# Patient Record
Sex: Male | Born: 1940 | Race: White | Hispanic: No | Smoking: Former smoker
Health system: Southern US, Community
[De-identification: ages and names within clinical notes are randomized; demographics above are authoritative.]

## PROBLEM LIST (undated history)

## (undated) DIAGNOSIS — F1910 Other psychoactive substance abuse, uncomplicated: Secondary | ICD-10-CM

## (undated) DIAGNOSIS — R310 Gross hematuria: Secondary | ICD-10-CM

## (undated) DIAGNOSIS — K5790 Diverticulosis of intestine, part unspecified, without perforation or abscess without bleeding: Secondary | ICD-10-CM

## (undated) DIAGNOSIS — C689 Malignant neoplasm of urinary organ, unspecified: Secondary | ICD-10-CM

## (undated) DIAGNOSIS — Z860100 Personal history of colon polyps, unspecified: Secondary | ICD-10-CM

## (undated) DIAGNOSIS — E785 Hyperlipidemia, unspecified: Secondary | ICD-10-CM

## (undated) DIAGNOSIS — K860 Alcohol-induced chronic pancreatitis: Secondary | ICD-10-CM

## (undated) DIAGNOSIS — I739 Peripheral vascular disease, unspecified: Secondary | ICD-10-CM

## (undated) DIAGNOSIS — I714 Abdominal aortic aneurysm, without rupture, unspecified: Secondary | ICD-10-CM

## (undated) DIAGNOSIS — I10 Essential (primary) hypertension: Secondary | ICD-10-CM

## (undated) DIAGNOSIS — M1990 Unspecified osteoarthritis, unspecified site: Secondary | ICD-10-CM

## (undated) HISTORY — DX: Personal history of colon polyps, unspecified: Z86.0100

## (undated) HISTORY — DX: Abdominal aortic aneurysm, without rupture: I71.4

## (undated) HISTORY — DX: Diverticulosis of intestine, part unspecified, without perforation or abscess without bleeding: K57.90

## (undated) HISTORY — DX: Malignant neoplasm of urinary organ, unspecified: C68.9

## (undated) HISTORY — DX: Personal history of colonic polyps: Z86.010

## (undated) HISTORY — DX: Abdominal aortic aneurysm, without rupture, unspecified: I71.40

## (undated) HISTORY — DX: Peripheral vascular disease, unspecified: I73.9

## (undated) HISTORY — PX: CHOLECYSTECTOMY: SHX55

## (undated) HISTORY — DX: Other psychoactive substance abuse, uncomplicated: F19.10

## (undated) HISTORY — DX: Essential (primary) hypertension: I10

## (undated) HISTORY — DX: Hyperlipidemia, unspecified: E78.5

---

## 1959-05-03 HISTORY — PX: KNEE SURGERY: SHX244

## 1996-05-02 HISTORY — PX: CATARACT EXTRACTION W/ INTRAOCULAR LENS  IMPLANT, BILATERAL: SHX1307

## 2004-01-23 ENCOUNTER — Ambulatory Visit (HOSPITAL_COMMUNITY): Admission: RE | Admit: 2004-01-23 | Discharge: 2004-01-23 | Payer: Self-pay | Admitting: Cardiology

## 2004-03-18 ENCOUNTER — Ambulatory Visit: Payer: Self-pay | Admitting: Internal Medicine

## 2004-07-28 ENCOUNTER — Ambulatory Visit: Payer: Self-pay | Admitting: Internal Medicine

## 2004-08-04 ENCOUNTER — Ambulatory Visit: Payer: Self-pay | Admitting: Internal Medicine

## 2004-08-11 ENCOUNTER — Inpatient Hospital Stay (HOSPITAL_COMMUNITY): Admission: EM | Admit: 2004-08-11 | Discharge: 2004-08-15 | Payer: Self-pay | Admitting: Emergency Medicine

## 2004-08-15 ENCOUNTER — Ambulatory Visit: Payer: Self-pay | Admitting: Internal Medicine

## 2004-09-29 ENCOUNTER — Ambulatory Visit: Payer: Self-pay | Admitting: Internal Medicine

## 2004-12-29 ENCOUNTER — Ambulatory Visit: Payer: Self-pay | Admitting: Internal Medicine

## 2005-02-05 ENCOUNTER — Ambulatory Visit: Payer: Self-pay | Admitting: Internal Medicine

## 2005-02-05 ENCOUNTER — Inpatient Hospital Stay (HOSPITAL_COMMUNITY): Admission: EM | Admit: 2005-02-05 | Discharge: 2005-02-10 | Payer: Self-pay | Admitting: Emergency Medicine

## 2005-02-08 ENCOUNTER — Ambulatory Visit: Payer: Self-pay | Admitting: Gastroenterology

## 2005-02-16 ENCOUNTER — Ambulatory Visit: Payer: Self-pay | Admitting: Internal Medicine

## 2005-02-24 ENCOUNTER — Ambulatory Visit: Payer: Self-pay | Admitting: Gastroenterology

## 2005-03-17 ENCOUNTER — Encounter: Admission: RE | Admit: 2005-03-17 | Discharge: 2005-03-17 | Payer: Self-pay | Admitting: Gastroenterology

## 2005-08-17 ENCOUNTER — Ambulatory Visit: Payer: Self-pay | Admitting: Internal Medicine

## 2005-08-22 ENCOUNTER — Ambulatory Visit: Payer: Self-pay | Admitting: Internal Medicine

## 2005-08-25 ENCOUNTER — Ambulatory Visit: Payer: Self-pay | Admitting: Internal Medicine

## 2005-11-11 ENCOUNTER — Emergency Department (HOSPITAL_COMMUNITY): Admission: EM | Admit: 2005-11-11 | Discharge: 2005-11-11 | Payer: Self-pay | Admitting: Emergency Medicine

## 2005-11-12 ENCOUNTER — Inpatient Hospital Stay (HOSPITAL_COMMUNITY): Admission: EM | Admit: 2005-11-12 | Discharge: 2005-11-13 | Payer: Self-pay | Admitting: Emergency Medicine

## 2005-11-15 ENCOUNTER — Inpatient Hospital Stay (HOSPITAL_COMMUNITY): Admission: EM | Admit: 2005-11-15 | Discharge: 2005-11-19 | Payer: Self-pay | Admitting: Emergency Medicine

## 2005-11-15 ENCOUNTER — Ambulatory Visit: Payer: Self-pay | Admitting: Internal Medicine

## 2005-11-17 ENCOUNTER — Ambulatory Visit: Payer: Self-pay | Admitting: Internal Medicine

## 2005-11-17 ENCOUNTER — Encounter (INDEPENDENT_AMBULATORY_CARE_PROVIDER_SITE_OTHER): Payer: Self-pay | Admitting: *Deleted

## 2005-11-29 ENCOUNTER — Ambulatory Visit: Payer: Self-pay | Admitting: Internal Medicine

## 2005-12-20 ENCOUNTER — Ambulatory Visit: Payer: Self-pay | Admitting: Internal Medicine

## 2006-01-20 ENCOUNTER — Ambulatory Visit: Payer: Self-pay | Admitting: Internal Medicine

## 2006-03-21 ENCOUNTER — Ambulatory Visit: Payer: Self-pay | Admitting: Internal Medicine

## 2006-06-16 ENCOUNTER — Encounter: Payer: Self-pay | Admitting: Internal Medicine

## 2006-06-19 ENCOUNTER — Encounter: Payer: Self-pay | Admitting: Internal Medicine

## 2006-07-07 ENCOUNTER — Encounter: Payer: Self-pay | Admitting: Internal Medicine

## 2006-07-10 ENCOUNTER — Encounter: Payer: Self-pay | Admitting: Internal Medicine

## 2006-08-08 ENCOUNTER — Ambulatory Visit: Payer: Self-pay | Admitting: Internal Medicine

## 2006-08-08 LAB — CONVERTED CEMR LAB
ALT: 38 units/L (ref 0–40)
AST: 40 units/L — ABNORMAL HIGH (ref 0–37)
Albumin: 3.9 g/dL (ref 3.5–5.2)
Alkaline Phosphatase: 89 units/L (ref 39–117)
Amylase: 48 units/L (ref 27–131)
BUN: 16 mg/dL (ref 6–23)
Basophils Absolute: 0.1 10*3/uL (ref 0.0–0.1)
Basophils Relative: 0.8 % (ref 0.0–1.0)
Bilirubin, Direct: 0.1 mg/dL (ref 0.0–0.3)
CO2: 31 meq/L (ref 19–32)
Calcium: 9.6 mg/dL (ref 8.4–10.5)
Chloride: 105 meq/L (ref 96–112)
Cholesterol: 135 mg/dL (ref 0–200)
Creatinine, Ser: 0.9 mg/dL (ref 0.4–1.5)
Eosinophils Absolute: 0.1 10*3/uL (ref 0.0–0.6)
Eosinophils Relative: 0.8 % (ref 0.0–5.0)
GFR calc Af Amer: 108 mL/min
GFR calc non Af Amer: 89 mL/min
Glucose, Bld: 111 mg/dL — ABNORMAL HIGH (ref 70–99)
HCT: 37.6 % — ABNORMAL LOW (ref 39.0–52.0)
HDL: 43.7 mg/dL (ref >39.0)
Hemoglobin: 13.1 g/dL (ref 13.0–17.0)
LDL Cholesterol: 69 mg/dL (ref 0–99)
Lymphocytes Relative: 17.6 % (ref 12.0–46.0)
MCHC: 34.8 g/dL (ref 30.0–36.0)
MCV: 93.5 fL (ref 78.0–100.0)
Monocytes Absolute: 0.6 10*3/uL (ref 0.2–0.7)
Monocytes Relative: 6.8 % (ref 3.0–11.0)
Neutro Abs: 6.3 10*3/uL (ref 1.4–7.7)
Neutrophils Relative %: 74 % (ref 43.0–77.0)
Platelets: 235 10*3/uL (ref 150–400)
Potassium: 4.3 meq/L (ref 3.5–5.1)
RBC: 4.02 M/uL — ABNORMAL LOW (ref 4.22–5.81)
RDW: 12.5 % (ref 11.5–14.6)
Sodium: 142 meq/L (ref 135–145)
Total Bilirubin: 0.6 mg/dL (ref 0.3–1.2)
Total CHOL/HDL Ratio: 3.1
Total Protein: 7.2 g/dL (ref 6.0–8.3)
Triglycerides: 112 mg/dL (ref 0–149)
VLDL: 22 mg/dL (ref 0–40)
WBC: 8.6 10*3/uL (ref 4.5–10.5)

## 2006-10-10 ENCOUNTER — Ambulatory Visit: Payer: Self-pay | Admitting: Internal Medicine

## 2006-10-12 DIAGNOSIS — K5730 Diverticulosis of large intestine without perforation or abscess without bleeding: Secondary | ICD-10-CM | POA: Insufficient documentation

## 2006-10-12 DIAGNOSIS — K859 Acute pancreatitis without necrosis or infection, unspecified: Secondary | ICD-10-CM | POA: Insufficient documentation

## 2006-10-12 DIAGNOSIS — I739 Peripheral vascular disease, unspecified: Secondary | ICD-10-CM

## 2006-10-12 DIAGNOSIS — I10 Essential (primary) hypertension: Secondary | ICD-10-CM | POA: Insufficient documentation

## 2006-10-12 DIAGNOSIS — Z860100 Personal history of colon polyps, unspecified: Secondary | ICD-10-CM | POA: Insufficient documentation

## 2007-01-03 ENCOUNTER — Telehealth: Payer: Self-pay | Admitting: Internal Medicine

## 2007-01-31 LAB — HM COLONOSCOPY: HM Colonoscopy: NORMAL

## 2007-02-05 ENCOUNTER — Ambulatory Visit: Payer: Self-pay | Admitting: Gastroenterology

## 2007-02-05 ENCOUNTER — Telehealth: Payer: Self-pay | Admitting: Internal Medicine

## 2007-02-13 ENCOUNTER — Ambulatory Visit: Payer: Self-pay | Admitting: Gastroenterology

## 2007-02-13 ENCOUNTER — Encounter: Payer: Self-pay | Admitting: Internal Medicine

## 2007-02-16 ENCOUNTER — Telehealth (INDEPENDENT_AMBULATORY_CARE_PROVIDER_SITE_OTHER): Payer: Self-pay

## 2007-02-16 ENCOUNTER — Telehealth: Payer: Self-pay | Admitting: Internal Medicine

## 2007-03-02 ENCOUNTER — Ambulatory Visit: Payer: Self-pay | Admitting: Internal Medicine

## 2007-05-31 ENCOUNTER — Telehealth: Payer: Self-pay | Admitting: Internal Medicine

## 2007-06-11 ENCOUNTER — Telehealth: Payer: Self-pay | Admitting: Internal Medicine

## 2007-06-29 ENCOUNTER — Telehealth: Payer: Self-pay | Admitting: Internal Medicine

## 2007-08-01 ENCOUNTER — Telehealth: Payer: Self-pay | Admitting: Internal Medicine

## 2007-08-07 ENCOUNTER — Ambulatory Visit: Payer: Self-pay | Admitting: Internal Medicine

## 2007-08-13 DIAGNOSIS — E785 Hyperlipidemia, unspecified: Secondary | ICD-10-CM | POA: Insufficient documentation

## 2007-10-02 ENCOUNTER — Telehealth: Payer: Self-pay | Admitting: Internal Medicine

## 2007-10-30 ENCOUNTER — Telehealth: Payer: Self-pay | Admitting: Internal Medicine

## 2007-11-26 ENCOUNTER — Telehealth: Payer: Self-pay | Admitting: Internal Medicine

## 2008-02-20 ENCOUNTER — Telehealth: Payer: Self-pay | Admitting: Internal Medicine

## 2008-03-20 ENCOUNTER — Ambulatory Visit: Payer: Self-pay | Admitting: Internal Medicine

## 2008-03-24 LAB — CONVERTED CEMR LAB
ALT: 25 units/L (ref 0–53)
AST: 30 units/L (ref 0–37)
Albumin: 4.3 g/dL (ref 3.5–5.2)
Alkaline Phosphatase: 51 units/L (ref 39–117)
BUN: 15 mg/dL (ref 6–23)
Bilirubin, Direct: 0.1 mg/dL (ref 0.0–0.3)
CO2: 30 meq/L (ref 19–32)
Calcium: 10.5 mg/dL (ref 8.4–10.5)
Chloride: 103 meq/L (ref 96–112)
Cholesterol: 141 mg/dL (ref 0–200)
Creatinine, Ser: 0.9 mg/dL (ref 0.4–1.5)
GFR calc Af Amer: 108 mL/min
GFR calc non Af Amer: 89 mL/min
Glucose, Bld: 111 mg/dL — ABNORMAL HIGH (ref 70–99)
HDL: 55.3 mg/dL (ref >39.0)
LDL Cholesterol: 69 mg/dL (ref 0–99)
PSA: 0.71 ng/mL (ref 0.10–4.00)
Potassium: 5 meq/L (ref 3.5–5.1)
Sodium: 144 meq/L (ref 135–145)
Total Bilirubin: 0.7 mg/dL (ref 0.3–1.2)
Total CHOL/HDL Ratio: 2.5
Total Protein: 7.9 g/dL (ref 6.0–8.3)
Triglycerides: 82 mg/dL (ref 0–149)
VLDL: 16 mg/dL (ref 0–40)

## 2008-04-03 ENCOUNTER — Ambulatory Visit: Payer: Self-pay | Admitting: Internal Medicine

## 2008-04-03 ENCOUNTER — Telehealth: Payer: Self-pay | Admitting: Internal Medicine

## 2008-04-07 ENCOUNTER — Telehealth: Payer: Self-pay | Admitting: Internal Medicine

## 2008-04-17 ENCOUNTER — Telehealth: Payer: Self-pay | Admitting: Gastroenterology

## 2008-05-02 HISTORY — PX: HERNIA REPAIR: SHX51

## 2008-05-02 HISTORY — PX: WRIST FRACTURE SURGERY: SHX121

## 2008-05-15 ENCOUNTER — Telehealth: Payer: Self-pay | Admitting: Internal Medicine

## 2008-06-11 ENCOUNTER — Telehealth: Payer: Self-pay | Admitting: Internal Medicine

## 2008-08-04 ENCOUNTER — Ambulatory Visit: Payer: Self-pay | Admitting: Internal Medicine

## 2008-08-21 ENCOUNTER — Encounter: Payer: Self-pay | Admitting: Internal Medicine

## 2008-09-09 ENCOUNTER — Telehealth: Payer: Self-pay | Admitting: Internal Medicine

## 2008-09-23 ENCOUNTER — Encounter (INDEPENDENT_AMBULATORY_CARE_PROVIDER_SITE_OTHER): Payer: Self-pay | Admitting: General Surgery

## 2008-09-23 ENCOUNTER — Ambulatory Visit (HOSPITAL_COMMUNITY): Admission: RE | Admit: 2008-09-23 | Discharge: 2008-09-23 | Payer: Self-pay | Admitting: Otolaryngology

## 2008-10-06 ENCOUNTER — Ambulatory Visit: Payer: Self-pay | Admitting: Internal Medicine

## 2008-10-08 ENCOUNTER — Encounter: Payer: Self-pay | Admitting: Internal Medicine

## 2008-10-08 ENCOUNTER — Telehealth: Payer: Self-pay | Admitting: Internal Medicine

## 2008-11-05 ENCOUNTER — Telehealth: Payer: Self-pay | Admitting: Internal Medicine

## 2008-12-02 ENCOUNTER — Telehealth: Payer: Self-pay | Admitting: Internal Medicine

## 2008-12-05 ENCOUNTER — Telehealth: Payer: Self-pay | Admitting: Internal Medicine

## 2008-12-29 ENCOUNTER — Telehealth: Payer: Self-pay | Admitting: Internal Medicine

## 2009-01-27 ENCOUNTER — Telehealth: Payer: Self-pay | Admitting: Internal Medicine

## 2009-01-28 ENCOUNTER — Ambulatory Visit: Payer: Self-pay | Admitting: Internal Medicine

## 2009-02-06 ENCOUNTER — Emergency Department (HOSPITAL_COMMUNITY): Admission: EM | Admit: 2009-02-06 | Discharge: 2009-02-06 | Payer: Self-pay | Admitting: Emergency Medicine

## 2009-02-23 ENCOUNTER — Telehealth: Payer: Self-pay | Admitting: Internal Medicine

## 2009-03-18 ENCOUNTER — Telehealth: Payer: Self-pay | Admitting: Internal Medicine

## 2009-03-25 ENCOUNTER — Ambulatory Visit: Payer: Self-pay | Admitting: Internal Medicine

## 2009-03-30 LAB — CONVERTED CEMR LAB
ALT: 31 units/L (ref 0–53)
AST: 30 units/L (ref 0–37)
Albumin: 4.5 g/dL (ref 3.5–5.2)
Alkaline Phosphatase: 45 units/L (ref 39–117)
BUN: 13 mg/dL (ref 6–23)
Basophils Absolute: 0 10*3/uL (ref 0.0–0.1)
Basophils Relative: 0.2 % (ref 0.0–3.0)
Bilirubin, Direct: 0 mg/dL (ref 0.0–0.3)
CO2: 30 meq/L (ref 19–32)
Calcium: 10.2 mg/dL (ref 8.4–10.5)
Chloride: 103 meq/L (ref 96–112)
Cholesterol: 165 mg/dL (ref 0–200)
Creatinine, Ser: 0.9 mg/dL (ref 0.4–1.5)
Eosinophils Absolute: 0.1 10*3/uL (ref 0.0–0.7)
Eosinophils Relative: 0.7 % (ref 0.0–5.0)
GFR calc non Af Amer: 88.65 mL/min (ref >60)
Glucose, Bld: 106 mg/dL — ABNORMAL HIGH (ref 70–99)
HCT: 42 % (ref 39.0–52.0)
HDL: 59.8 mg/dL (ref >39.00)
Hemoglobin: 13.7 g/dL (ref 13.0–17.0)
LDL Cholesterol: 84 mg/dL (ref 0–99)
Lymphocytes Relative: 11.3 % — ABNORMAL LOW (ref 12.0–46.0)
Lymphs Abs: 1.2 10*3/uL (ref 0.7–4.0)
MCHC: 32.7 g/dL (ref 30.0–36.0)
MCV: 102.5 fL — ABNORMAL HIGH (ref 78.0–100.0)
Monocytes Absolute: 0.6 10*3/uL (ref 0.1–1.0)
Monocytes Relative: 5.6 % (ref 3.0–12.0)
Neutro Abs: 9 10*3/uL — ABNORMAL HIGH (ref 1.4–7.7)
Neutrophils Relative %: 82.2 % — ABNORMAL HIGH (ref 43.0–77.0)
Platelets: 216 10*3/uL (ref 150.0–400.0)
Potassium: 5.7 meq/L — ABNORMAL HIGH (ref 3.5–5.1)
RBC: 4.1 M/uL — ABNORMAL LOW (ref 4.22–5.81)
RDW: 12.8 % (ref 11.5–14.6)
Sodium: 141 meq/L (ref 135–145)
Total Bilirubin: 0.8 mg/dL (ref 0.3–1.2)
Total CHOL/HDL Ratio: 3
Total Protein: 8.1 g/dL (ref 6.0–8.3)
Triglycerides: 107 mg/dL (ref 0.0–149.0)
VLDL: 21.4 mg/dL (ref 0.0–40.0)
WBC: 10.9 10*3/uL — ABNORMAL HIGH (ref 4.5–10.5)

## 2009-04-01 ENCOUNTER — Ambulatory Visit: Payer: Self-pay | Admitting: Internal Medicine

## 2009-04-01 LAB — CONVERTED CEMR LAB
Potassium: 5.5 meq/L — ABNORMAL HIGH (ref 3.5–5.1)
Vitamin B-12: 313 pg/mL (ref 211–911)

## 2009-04-15 ENCOUNTER — Telehealth: Payer: Self-pay | Admitting: Internal Medicine

## 2009-05-06 ENCOUNTER — Telehealth: Payer: Self-pay | Admitting: Internal Medicine

## 2009-06-02 ENCOUNTER — Telehealth: Payer: Self-pay | Admitting: Internal Medicine

## 2009-06-30 ENCOUNTER — Telehealth: Payer: Self-pay | Admitting: Internal Medicine

## 2009-08-03 ENCOUNTER — Ambulatory Visit: Payer: Self-pay | Admitting: Internal Medicine

## 2009-08-03 LAB — CONVERTED CEMR LAB
ALT: 26 units/L (ref 0–53)
AST: 33 units/L (ref 0–37)
Albumin: 4.5 g/dL (ref 3.5–5.2)
Alkaline Phosphatase: 55 units/L (ref 39–117)
BUN: 12 mg/dL (ref 6–23)
Basophils Absolute: 0 10*3/uL (ref 0.0–0.1)
Basophils Relative: 0.5 % (ref 0.0–3.0)
Bilirubin, Direct: 0 mg/dL (ref 0.0–0.3)
CO2: 30 meq/L (ref 19–32)
Calcium: 10.1 mg/dL (ref 8.4–10.5)
Chloride: 103 meq/L (ref 96–112)
Cholesterol: 133 mg/dL (ref 0–200)
Creatinine, Ser: 1 mg/dL (ref 0.4–1.5)
Eosinophils Absolute: 0.1 10*3/uL (ref 0.0–0.7)
Eosinophils Relative: 0.6 % (ref 0.0–5.0)
GFR calc non Af Amer: 78.42 mL/min (ref >60)
Glucose, Bld: 114 mg/dL — ABNORMAL HIGH (ref 70–99)
HCT: 42.2 % (ref 39.0–52.0)
HDL: 45.1 mg/dL (ref >39.00)
Hemoglobin: 14.4 g/dL (ref 13.0–17.0)
LDL Cholesterol: 50 mg/dL (ref 0–99)
Lymphocytes Relative: 14.4 % (ref 12.0–46.0)
Lymphs Abs: 1.3 10*3/uL (ref 0.7–4.0)
MCHC: 34.3 g/dL (ref 30.0–36.0)
MCV: 99 fL (ref 78.0–100.0)
Monocytes Absolute: 0.5 10*3/uL (ref 0.1–1.0)
Monocytes Relative: 5.6 % (ref 3.0–12.0)
Neutro Abs: 7.2 10*3/uL (ref 1.4–7.7)
Neutrophils Relative %: 78.9 % — ABNORMAL HIGH (ref 43.0–77.0)
PSA: 0.65 ng/mL (ref 0.10–4.00)
Platelets: 230 10*3/uL (ref 150.0–400.0)
Potassium: 4.9 meq/L (ref 3.5–5.1)
RBC: 4.26 M/uL (ref 4.22–5.81)
RDW: 13.7 % (ref 11.5–14.6)
Sodium: 142 meq/L (ref 135–145)
TSH: 2.83 microintl units/mL (ref 0.35–5.50)
Total Bilirubin: 0.4 mg/dL (ref 0.3–1.2)
Total CHOL/HDL Ratio: 3
Total Protein: 7.6 g/dL (ref 6.0–8.3)
Triglycerides: 188 mg/dL — ABNORMAL HIGH (ref 0.0–149.0)
VLDL: 37.6 mg/dL (ref 0.0–40.0)
WBC: 9.2 10*3/uL (ref 4.5–10.5)

## 2009-08-31 ENCOUNTER — Telehealth: Payer: Self-pay | Admitting: Internal Medicine

## 2009-09-30 ENCOUNTER — Telehealth: Payer: Self-pay | Admitting: Internal Medicine

## 2009-10-27 ENCOUNTER — Telehealth: Payer: Self-pay | Admitting: Internal Medicine

## 2009-11-25 ENCOUNTER — Telehealth: Payer: Self-pay | Admitting: Internal Medicine

## 2009-11-26 ENCOUNTER — Telehealth: Payer: Self-pay | Admitting: Internal Medicine

## 2009-12-03 ENCOUNTER — Telehealth: Payer: Self-pay | Admitting: Internal Medicine

## 2009-12-23 ENCOUNTER — Telehealth: Payer: Self-pay | Admitting: Internal Medicine

## 2009-12-30 ENCOUNTER — Encounter (INDEPENDENT_AMBULATORY_CARE_PROVIDER_SITE_OTHER): Payer: Self-pay | Admitting: *Deleted

## 2010-01-20 ENCOUNTER — Telehealth: Payer: Self-pay | Admitting: Internal Medicine

## 2010-02-22 ENCOUNTER — Telehealth: Payer: Self-pay | Admitting: Internal Medicine

## 2010-03-15 ENCOUNTER — Ambulatory Visit: Payer: Self-pay | Admitting: Internal Medicine

## 2010-03-29 ENCOUNTER — Telehealth: Payer: Self-pay | Admitting: Internal Medicine

## 2010-04-15 ENCOUNTER — Telehealth: Payer: Self-pay | Admitting: Internal Medicine

## 2010-04-27 ENCOUNTER — Telehealth: Payer: Self-pay | Admitting: Internal Medicine

## 2010-05-04 ENCOUNTER — Telehealth: Payer: Self-pay | Admitting: Internal Medicine

## 2010-05-14 ENCOUNTER — Telehealth: Payer: Self-pay | Admitting: Internal Medicine

## 2010-06-02 ENCOUNTER — Telehealth: Payer: Self-pay | Admitting: Internal Medicine

## 2010-06-02 DIAGNOSIS — R52 Pain, unspecified: Secondary | ICD-10-CM

## 2010-06-02 NOTE — Medication Information (Signed)
Summary: Oxycodone / [facility]  Oxycodone / [facility]   Imported By: Lennie Odor 10/22/2009 10:58:35  _____________________________________________________________________  External Attachment:    Type:   Image     Comment:   External Document

## 2010-06-02 NOTE — Telephone Encounter (Signed)
Pt would like Rx for Hydrocodone sent to [facility], [address] [place] [address]   #  [phone].

## 2010-06-02 NOTE — Progress Notes (Signed)
Summary: rx refill   Phone Note Call from Patient   Summary of Call: patient given wrong rx.  rx for hydrocodone 5/500 was shredded. Initial call taken by: Kern Reap CMA Duncan Dull),  November 26, 2009 12:49 PM    Prescriptions: ENDOCET 10-325 MG  TABS (OXYCODONE-ACETAMINOPHEN) Take 1 tablet by mouth two times a day  #60 x 0   Entered by:   Kern Reap CMA (AAMA)   Authorized by:   Stacie Glaze MD   Signed by:   Kern Reap CMA (AAMA) on 11/26/2009   Method used:   Print then Give to Patient   RxID:   [id number]

## 2010-06-02 NOTE — Assessment & Plan Note (Signed)
Summary: CPX (PT WILL COME IN FASTING) // RS   Vital Signs:  Patient profile:   70 year old male Height:      72.5 inches Weight:      211 pounds BMI:     28.33 Pulse rate:   62 / minute Pulse rhythm:   regular Resp:     12 per minute BP sitting:   144 / 70  (left arm)  Vitals Entered By: Gladis Riffle, RN (August 03, 2009 8:28 AM) CC: cpx, non-fasting--c/o right great toe discoloration, continued numbness tips of fingers left hand Is Patient Diabetic? No   CC:  cpx, non-fasting--c/o right great toe discoloration, and continued numbness tips of fingers left hand.  History of Present Illness: Here for Medicare AWV:  1.   Risk factors based on Past M, S, F history: see note 2.   Physical Activities: he remains active 3.   Depression/mood: denies 4.   Hearing: denies complaints 5.   ADL's: he is able to do all 6.   Fall Risk: none known 7.   Home Safety:  no problems 8.   Height, weight, &visual acuity: see note---no trouble with vision 9.   Counseling: none necessary 10.   Labs ordered based on risk factors: see orders/results 11.           Referral Coordination--none necessary 12.           Care Plan -- regular exercise  Other medical problems: chronic pancreaitis: requires narcotics but NO hospitalizations HTN-- no trouble on meds Lipids: tolerating meds without difficulty  All other systems reviewed and were negative   Preventive Care Screening  Colonoscopy:    Date:  01/31/2007    Next Due:  01/2010    Results:  normal-   Last Tetanus Booster:    Date:  08/03/2009    Results:  Td   Preventive Screening-Counseling & Management  Alcohol-Tobacco     Smoking Status: quit > 6 months     Year Started: 1956     Year Quit: 2004  Current Problems (verified): 1)  Hyperlipidemia  (ICD-272.4) 2)  Pancreatitis  (ICD-577.0) 3)  Peripheral Vascular Disease  (ICD-443.9) 4)  Hypertension  (ICD-401.9) 5)  Diverticulosis, Colon  (ICD-562.10) 6)  Colonic Polyps, Hx of   (ICD-V12.72)  Current Medications (verified): 1)  Crestor 10 Mg Tabs (Rosuvastatin Calcium) .... Take 1 Tablet By Mouth Once A Day 2)  Dynacirc Cr 10 Mg Xr24h-Tab (Isradipine) .... Take 1 Tablet By Mouth Once A Day 3)  Hydrocodone-Acetaminophen 5-500 Mg Tabs (Hydrocodone-Acetaminophen) .... One By Mouth As Directed--Takes One Every 4 Hours During Awaking Hours 4)  Vitamin C 500 Mg Tabs (Ascorbic Acid) .... Take 1 Once A Day 5)  Aspirin 81 Mg  Tbec (Aspirin) .... Once Daily 6)  Endocet 10-325 Mg  Tabs (Oxycodone-Acetaminophen) .... Take 1 Tablet By Mouth Two Times A Day 7)  Aleve 220 Mg Tabs (Naproxen Sodium) .... As Needed 8)  Lisinopril 20 Mg Tabs (Lisinopril) .Marland Kitchen.. 1 Tablet By Mouth Daily 9)  Phillips Stool Softener .... As Needed 10)  Creon 12000 Unit Cpep (Pancrelipase (Lip-Prot-Amyl)) .... Take 2 Capsules By Mouth Three Times A Day  Allergies (verified): No Known Drug Allergies  Past History:  Past Medical History: Last updated: 08/07/2007 Colonic polyps, hx of--adenomatous Diverticulosis, colon Hypertension Peripheral vascular disease alcoholic pancreatitis Hyperlipidemia  Social History: Last updated: 08/03/2009 Former Smoker Alcohol use-no (previous)  Risk Factors: Smoking Status: quit > 6 months (08/03/2009)  Past  Surgical History: Cholecystectomy--complications of infection--hospitalized 5 months--1992 knee surgery L 1961 stents legs.  Inguinal herniorrhaphy--2010 Wrist fracture cataracts bilaterally  Family History: father: deceased MI age 34 mother: deceased age 21 heart related 2 sibs-- overweight  Social History: Former Smoker Alcohol use-no (previous)  Review of Systems       All other systems reviewed and were negative   Physical Exam  General:  Well-developed,well-nourished,in no acute distress; alert,appropriate and cooperative throughout examination Head:  normocephalic and atraumatic.   Eyes:  pupils equal and pupils round.   Ears:  R  ear normal and L ear normal.   Neck:  No deformities, masses, or tenderness noted. Chest Wall:  no deformities and no tenderness.   Lungs:  normal respiratory effort and no intercostal retractions.   Heart:  regular rhythm and no murmur.   Abdomen:  Bowel sounds positive,abdomen soft and non-tender without masses, organomegaly  Rectal:  no external abnormalities and no hemorrhoids.   Prostate:  no nodules and no asymmetry.   Msk:  wearing brace left wrist Pulses:  R radial normal and L radial normal.   Neurologic:  alert & oriented X3 and gait normal.     Impression & Recommendations:  Problem # 1:  HYPERLIPIDEMIA (ICD-272.4)  previously controlled check labs today His updated medication list for this problem includes:    Crestor 10 Mg Tabs (Rosuvastatin calcium) .Marland Kitchen... Take 1 tablet by mouth once a day  Labs Reviewed: SGOT: 30 (03/25/2009)   SGPT: 31 (03/25/2009)   HDL:59.80 (03/25/2009), 55.3 (03/20/2008)  LDL:84 (03/25/2009), 69 (03/20/2008)  Chol:165 (03/25/2009), 141 (03/20/2008)  Trig:107.0 (03/25/2009), 82 (03/20/2008)  Orders: Venipuncture (86578) TLB-Lipid Panel (80061-LIPID) TLB-Hepatic/Liver Function Pnl (80076-HEPATIC) TLB-TSH (Thyroid Stimulating Hormone) (84443-TSH)  Problem # 2:  PANCREATITIS (ICD-577.0)  clinically stable  Orders: TLB-CBC Platelet - w/Differential (85025-CBCD)  Problem # 3:  HYPERTENSION (ICD-401.9)  reasonable congtrol at home His updated medication list for this problem includes:    Dynacirc Cr 10 Mg Xr24h-tab (Isradipine) .Marland Kitchen... Take 1 tablet by mouth once a day    Lisinopril 20 Mg Tabs (Lisinopril) .Marland Kitchen... 1 tablet by mouth daily  BP today: 144/70 Prior BP: 140/78 (03/25/2009)  Labs Reviewed: K+: 5.5 (04/01/2009) Creat: : 0.9 (03/25/2009)   Chol: 165 (03/25/2009)   HDL: 59.80 (03/25/2009)   LDL: 84 (03/25/2009)   TG: 107.0 (03/25/2009)  Orders: TLB-BMP (Basic Metabolic Panel-BMET) (80048-METABOL)  Problem # 4:  PREVENTIVE  HEALTH CARE (ICD-V70.0)  health maint UTD  Orders: Subsequent annual wellness visit with prevention plan (I6962) UA Dipstick w/o Micro (automated)  (81003)  Complete Medication List: 1)  Crestor 10 Mg Tabs (Rosuvastatin calcium) .... Take 1 tablet by mouth once a day 2)  Dynacirc Cr 10 Mg Xr24h-tab (Isradipine) .... Take 1 tablet by mouth once a day 3)  Hydrocodone-acetaminophen 5-500 Mg Tabs (Hydrocodone-acetaminophen) .... One by mouth as directed--takes one every 4 hours during awaking hours 4)  Vitamin C 500 Mg Tabs (Ascorbic acid) .... Take 1 once a day 5)  Aspirin 81 Mg Tbec (Aspirin) .... Once daily 6)  Endocet 10-325 Mg Tabs (Oxycodone-acetaminophen) .... Take 1 tablet by mouth two times a day 7)  Aleve 220 Mg Tabs (Naproxen sodium) .... As needed 8)  Lisinopril 20 Mg Tabs (Lisinopril) .Marland Kitchen.. 1 tablet by mouth daily 9)  Phillips Stool Softener  .... As needed 10)  Creon 12000 Unit Cpep (Pancrelipase (lip-prot-amyl)) .... Take 2 capsules by mouth three times a day  Other Orders: TD Toxoids IM 7 YR + (  [address]) Admin 1st Vaccine (98119) TLB-PSA (Prostate Specific Antigen) (84153-PSA) Prescriptions: LISINOPRIL 20 MG TABS (LISINOPRIL) 1 tablet by mouth daily  #90 x 3   Entered and Authorized by:   [name] MD   Signed by:   [name] MD on 08/03/2009   Method used:   Electronically to        [facility] [address] (retail)       [address]       [place], [place]  [address]       Ph: [phone] or [phone]       Fax: [phone]   RxID:   [id number] ENDOCET 10-325 MG  TABS (OXYCODONE-ACETAMINOPHEN) Take 1 tablet by mouth two times a day  #60 x 0   Entered and Authorized by:   [name] MD   Signed by:   [name] MD on 08/03/2009   Method used:   Print then Give to Patient   RxID:   [id number]    Preventive Care Screening  Colonoscopy:    Date:  01/31/2007    Next Due:  01/2010    Results:  normal-     Last Tetanus Booster:    Date:  08/03/2009    Results:  Td   Prevention & Chronic Care Immunizations   Influenza vaccine: Fluvax 3+  (01/28/2009)    Tetanus booster: 08/03/2009: Td    Pneumococcal vaccine: Pneumovax  (01/30/2005)    H. zoster vaccine: Not documented  Colorectal Screening   Hemoccult: Not documented    Colonoscopy: normal-  (01/31/2007)   Colonoscopy due: 01/2010  Other Screening   PSA: 0.71  (03/20/2008)   PSA ordered.   Smoking status: quit > 6 months  (08/03/2009)  Lipids   Total Cholesterol: 165  (03/25/2009)   LDL: 84  (03/25/2009)   LDL Direct: Not documented   HDL: 59.80  (03/25/2009)   Triglycerides: 107.0  (03/25/2009)    SGOT (AST): 30  (03/25/2009)   SGPT (ALT): 31  (03/25/2009)   Alkaline phosphatase: 45  (03/25/2009)   Total bilirubin: 0.8  (03/25/2009)  Hypertension   Last Blood Pressure: 144 / 70  (08/03/2009)   Serum creatinine: 0.9  (03/25/2009)   Serum potassium 5.5  (04/01/2009)  Self-Management Support :    Hypertension self-management support: Not documented    Lipid self-management support: Not documented      Immunizations Administered:  Tetanus Vaccine:    Vaccine Type: Td    Site: left deltoid    Mfr: Sanofi Pasteur    Dose: 0.5 ml    Route: IM    Given by: Gladis Riffle, RN    Exp. Date: 03/17/2011    Lot #: [id number]   Appended Document: CPX (PT WILL COME IN FASTING) // RS  Laboratory Results   Urine Tests    Routine Urinalysis   Color: yellow Appearance: Clear Glucose: negative   (Normal Range: Negative) Bilirubin: negative   (Normal Range: Negative) Ketone: negative   (Normal Range: Negative) Spec. Gravity: 1.010   (Normal Range: 1.003-1.035) Blood: negative   (Normal Range: Negative) pH: 7.0   (Normal Range: 5.0-8.0) Protein: negative   (Normal Range: Negative) Urobilinogen: 0.2   (Normal Range: 0-1) Nitrite: negative   (Normal Range: Negative) Leukocyte Esterace: negative   (Normal  Range: Negative)    Comments: Rita Ohara  August 03, 2009 9:30 AM

## 2010-06-02 NOTE — Progress Notes (Signed)
Summary: REQ FOR REFILL ON MEDS  Phone Note Call from Patient   Caller: Patient Summary of Call: Pt called req refill Endocet 10-325mg  mg  #60, Req that same be sent to him via mail to address provided below..... Pt also req that RX for med: Hydrocodone / APAP 5-500mg   be sent to [facility] - [place], [place] @ [phone]........... Info Only - Pt scheduled appt for CPX w/ Dr Cato Mulligan on 08/03/2009 @ 8am (Pt will come in fasting for labs).  Pt can be reached  [phone] with any questions or concerns.  Pt req that same be mailed to him (pt states that he gave SASE to Alvino Chapel, Charity fundraiser and  normally gets RX's mailed to him by [employer] Chapel, Charity fundraiser) at:   [facility] [address] [place], [place]  [address]  Initial call taken by: Debbra Riding,  June 30, 2009 9:11 AM  Follow-up for Phone Call        see Rx for endocet.  Too early for hydrocodone-apap--not due until 3/16.  Will let pt know when I call to tell him other ready to be mailed. Follow-up by: Gladis Riffle, RN,  July 02, 2009 2:10 PM  Additional Follow-up for Phone Call Additional follow up Details #1::        Pt called to check on status of refill. I notified pt that hydrocodone-apap was too early to fill and the other script ready to be mailed, as noted above.  Additional Follow-up by: Lucy Antigua,  July 02, 2009 3:03 PM    Additional Follow-up for Phone Call Additional follow up Details #2::    Left message to notify pt Rx will be mailed today.  Will call in other Rx on 3/16 whqn due. Follow-up by: Gladis Riffle, RN,  July 03, 2009 1:24 PM  Additional Follow-up for Phone Call Additional follow up Details #3:: Details for Additional Follow-up Action Taken: hydrocodone-apap [facility] [place], [place] at [phone] Additional Follow-up by: Gladis Riffle, RN,  July 13, 2009 12:14 PM  Prescriptions: HYDROCODONE-ACETAMINOPHEN 5-500 MG TABS (HYDROCODONE-ACETAMINOPHEN) one by mouth as  directed--takes one every 4 hours during awaking hours  #100 x 3   Entered by:   Gladis Riffle, RN   Authorized by:   [name] MD   Signed by:   Gladis Riffle, RN on 07/13/2009   Method used:   Telephoned to ...       [facility] (retail)       [address]       [place], [place]  [address]       Ph: [phone] or [phone]       Fax: [phone]   RxID:   [id number] ENDOCET 10-325 MG  TABS (OXYCODONE-ACETAMINOPHEN) Take 1 tablet by mouth two times a day  #60 x 0   Entered by:   Gladis Riffle, RN   Authorized by:   [name] MD   Signed by:   Gladis Riffle, RN on 07/02/2009   Method used:   Print then Give to Patient   RxID:   [id number]

## 2010-06-02 NOTE — Progress Notes (Signed)
Summary: new rx  Phone Note Call from Patient Call back at Home Phone [phone]   Caller: Patient Call For: [name] MD Summary of Call: Pt needs new rx endocet 10-325mg  Initial call taken by: Heron Sabins,  November 25, 2009 8:50 AM  Follow-up for Phone Call        is this okay to fill? Follow-up by: Kern Reap CMA Duncan Dull),  November 25, 2009 10:08 AM  Additional Follow-up for Phone Call Additional follow up Details #1::        yes Additional Follow-up by: [name] MD,  November 25, 2009 11:45 AM    Prescriptions: HYDROCODONE-ACETAMINOPHEN 5-500 MG TABS (HYDROCODONE-ACETAMINOPHEN) one by mouth as directed--takes one every 4 hours during awaking hours  #100 x 3   Entered by:   Kern Reap CMA (AAMA)   Authorized by:   [name] MD   Signed by:   Kern Reap CMA (AAMA) on 11/25/2009   Method used:   Print then Give to Patient   RxID:   [id number]

## 2010-06-02 NOTE — Progress Notes (Signed)
Summary: Pt req script for Endocet. Pt leaving to go out of town  Phone Note Refill Request Call back at [facility] [phone]   Refills Requested: Medication #1:  ENDOCET 10-325 MG  TABS Take 1 tablet by mouth two times a day Pt leaving town on Sept 25, 2011. Pt is req to pick up on 01/22/10.     Method Requested: Pick up at Office Initial call taken by: Lucy Antigua,  January 20, 2010 10:09 AM  Follow-up for Phone Call        ok Follow-up by: [name] MD,  January 21, 2010 12:49 PM    Prescriptions: ENDOCET 10-325 MG  TABS (OXYCODONE-ACETAMINOPHEN) Take 1 tablet by mouth two times a day  #60 x 0   Entered by:   Lynann Beaver CMA   Authorized by:   [name] MD   Signed by:   Lynann Beaver CMA on 01/21/2010   Method used:   Telephoned to ...       [facility] (retail)       [address]       [place], [place]  [address]       Ph: [phone] or [phone]       Fax: [phone]   RxID:   [id number]

## 2010-06-02 NOTE — Progress Notes (Signed)
Summary: refill  Phone Note Refill Request Call back at Home Phone [phone] Message from:  Patient----live call  Refills Requested: Medication #1:  HYDROCODONE-ACETAMINOPHEN 5-500 MG TABS one every 4 hours during waking hours [facility] to [facility]---phone--[phone].    fax---[phone]  Initial call taken by: Warnell Forester,  March 29, 2010 9:36 AM  Follow-up for Phone Call        3 refills [facility] to [facility] in September.  Called pt and informed him.  He will call [facility] Follow-up by: Alfred Levins, CMA,  March 30, 2010 8:02 AM     Appended Document: refill     Prescriptions: HYDROCODONE-ACETAMINOPHEN 5-500 MG TABS (HYDROCODONE-ACETAMINOPHEN) one every 4 hours during waking hours  #100 x 0   Entered by:   Alfred Levins, CMA   Authorized by:   [name] MD   Signed by:   Alfred Levins, CMA on 03/30/2010   Method used:   Telephoned to ...       [facility] (retail)       [address]       [place], [place]  [address]       Ph: [phone] or [phone]       Fax: [phone]   RxID:   [id number]    telephoned to [facility] [phone]

## 2010-06-02 NOTE — Progress Notes (Signed)
Summary: refill--endocet  Phone Note Refill Request Call back at Home Phone [phone] Message from:  Patient--live call  Refills Requested: Medication #1:  ENDOCET 10-325 MG  TABS Take 1 tablet by mouth two times a day call when ready.  Initial call taken by: Warnell Forester,  Aug 31, 2009 8:59 AM  Follow-up for Phone Call        see Rx Follow-up by: Gladis Riffle, RN,  Aug 31, 2009 9:24 AM  Additional Follow-up for Phone Call Additional follow up Details #1::        Patient notified Rx signed and ready via personal voice mail. Additional Follow-up by: Gladis Riffle, RN,  Aug 31, 2009 11:57 AM    Prescriptions: ENDOCET 10-325 MG  TABS (OXYCODONE-ACETAMINOPHEN) Take 1 tablet by mouth two times a day  #60 x 0   Entered by:   Gladis Riffle, RN   Authorized by:   [name] MD   Signed by:   Gladis Riffle, RN on 08/31/2009   Method used:   Print then Give to Patient   RxID:   [id number]

## 2010-06-02 NOTE — Letter (Signed)
Summary: Colonoscopy Letter  [facility]  [address]   [place], [place] [address]   Phone: [phone]  Fax: [phone]      December 30, 2009 MRN: [id number]   [name] [address] [place], [place]  [address]   Dear Mr. Allmendinger,   According to your medical record, it is time for you to schedule a Colonoscopy. The American Cancer Society recommends this procedure as a method to detect early colon cancer. Patients with a family history of colon cancer, or a personal history of colon polyps or inflammatory bowel disease are at increased risk.  This letter has beeen generated based on the recommendations made at the time of your procedure. If you feel that in your particular situation this may no longer apply, please contact our office.  Please call our office at [phone] to schedule this appointment or to update your records at your earliest convenience.  Thank you for cooperating with us to provide you with the very best care possible.   Sincerely,  Malcolm T. Stark, M.D.  [facility] [phone] 

## 2010-06-02 NOTE — Progress Notes (Signed)
Summary: endocet  Phone Note Refill Request Call back at Home Phone [phone]   Refills Requested: Medication #1:  ENDOCET 10-325 MG  TABS Take 1 tablet by mouth two times a day   Brand Name Necessary? Yes call when ready for pick up.  Initial call taken by: Warnell Forester,  September 30, 2009 8:20 AM  Follow-up for Phone Call        see Rx. Patient notified via voice mail on personal phone. Follow-up by: Gladis Riffle, RN,  September 30, 2009 12:41 PM    Prescriptions: ENDOCET 10-325 MG  TABS (OXYCODONE-ACETAMINOPHEN) Take 1 tablet by mouth two times a day  #60 x 0   Entered by:   Gladis Riffle, RN   Authorized by:   [name] MD   Signed by:   Gladis Riffle, RN on 09/30/2009   Method used:   Print then Give to Patient   RxID:   [id number]

## 2010-06-02 NOTE — Progress Notes (Signed)
Summary: Pt req script for Endocet 10-325mg   Phone Note Refill Request Call back at Home Phone [phone] Message from:  Patient on December 23, 2009 8:27 AM  Refills Requested: Medication #1:  ENDOCET 10-325 MG  TABS Take 1 tablet by mouth two times a day   Dosage confirmed as above?Dosage Confirmed   Supply Requested: 1 month  Method Requested: Pick up at Office Initial call taken by: Lucy Antigua,  December 23, 2009 8:27 AM  Follow-up for Phone Call        not due until 12/26/09 which is Sat.  Rx will be ready for pick up on Friday 12/25/09.  Left message on personal phone to notify pt. Follow-up by: Gladis Riffle, RN,  December 23, 2009 3:16 PM  Additional Follow-up for Phone Call Additional follow up Details #1::        See Rx.  Will be signed 12/25/09 and ready for pick up.  Pt notified via phone message. Additional Follow-up by: Gladis Riffle, RN,  December 24, 2009 1:50 PM    Prescriptions: ENDOCET 10-325 MG  TABS (OXYCODONE-ACETAMINOPHEN) Take 1 tablet by mouth two times a day  #60 x 0   Entered by:   Gladis Riffle, RN   Authorized by:   [name] MD   Signed by:   Gladis Riffle, RN on 12/24/2009   Method used:   Print then Give to Patient   RxID:   [id number]

## 2010-06-02 NOTE — Progress Notes (Signed)
Summary: Med list / [facility]  Med list / [facility]   Imported By: Lennie Odor 10/22/2009 10:56:40  _____________________________________________________________________  External Attachment:    Type:   Image     Comment:   External Document

## 2010-06-02 NOTE — Medication Information (Signed)
Summary: Hydrocodine / [facility]  Hydrocodine / [facility]   Imported By: Lennie Odor 10/22/2009 10:45:02  _____________________________________________________________________  External Attachment:    Type:   Image     Comment:   External Document

## 2010-06-02 NOTE — Progress Notes (Signed)
Summary: REFILL REQUEST  Phone Note Refill Request Message from:  Patient on October 27, 2009 1:46 PM  Refills Requested: Medication #1:  ENDOCET 10-325 MG  TABS Take 1 tablet by mouth two times a day   Notes: Pt can be reached at [phone] when Rx is ready for p/u.    Initial call taken by: Debbra Riding,  October 27, 2009 1:47 PM    Prescriptions: ENDOCET 10-325 MG  TABS (OXYCODONE-ACETAMINOPHEN) Take 1 tablet by mouth two times a day  #60 x 0   Entered by:   Kern Reap CMA (AAMA)   Authorized by:   Stacie Glaze MD   Signed by:   Kern Reap CMA (AAMA) on 10/27/2009   Method used:   Print then Give to Patient   RxID:   [id number]

## 2010-06-02 NOTE — Progress Notes (Signed)
Summary: refill  Phone Note Refill Request Call back at Home Phone [phone] Message from:  Patient---live call  Refills Requested: Medication #1:  ENDOCET 10-325 MG  TABS Take 1 tablet by mouth two times a day call pt when ready.  Initial call taken by: Warnell Forester,  February 22, 2010 8:57 AM  Follow-up for Phone Call        ok Follow-up by: [name] MD,  February 23, 2010 11:51 AM  Additional Follow-up for Phone Call Additional follow up Details #1::        rx up front, ready for p/u, pt aware Additional Follow-up by: Alfred Levins, CMA,  February 24, 2010 8:27 AM    Prescriptions: ENDOCET 10-325 MG  TABS (OXYCODONE-ACETAMINOPHEN) Take 1 tablet by mouth two times a day  #60 x 0   Entered by:   Alfred Levins, CMA   Authorized by:   [name] MD   Signed by:   Alfred Levins, CMA on 02/23/2010   Method used:   Print then Give to Patient   RxID:   [id number]

## 2010-06-02 NOTE — Progress Notes (Signed)
Summary: refill ASAP  Phone Note From Pharmacy   [facility]: [facility] [address] Call For: Dr. Cato Mulligan  Summary of Call: Pt got Endocet filled 11/26/2009, but needs his Hydrocodone/ Apap. 5//500 refilled and has been waiting.  Was denied????  He takes both.  Initial call taken by: Lynann Beaver CMA,  December 03, 2009 4:48 PM  Follow-up for Phone Call        refill both Follow-up by: [name] MD,  December 04, 2009 8:20 AM    Prescriptions: HYDROCODONE-ACETAMINOPHEN 5-500 MG TABS (HYDROCODONE-ACETAMINOPHEN) one by mouth as directed--takes one every 4 hours during awaking hours  #100 x 0   Entered by:   Lynann Beaver CMA   Authorized by:   [name] MD   Signed by:   Lynann Beaver CMA on 12/04/2009   Method used:   Telephoned to ...       [facility] (retail)       [address]       [place], [place]  [address]       Ph: [phone] or [phone]       Fax: [phone]   RxID:   [id number]

## 2010-06-02 NOTE — Progress Notes (Signed)
Summary: REQ FOR REFILL ON MED  Phone Note Call from Patient   Caller: Patient @ [phone] Reason for Call: Refill Medication Summary of Call: Pt called req refill Endocet 10-325mg  mg  #60..... Pt can be reached  [phone] with any questions or concerns.  Pt req that same be mailed to him (pt states that he gave SASE to Alvino Chapel, Charity fundraiser and  normally gets RX's mailed to him by Alvino Chapel, Charity fundraiser) at:   [facility] [address] [place], [place]  [address]   Follow-up for Phone Call        not quite due as last got 04/15/09.  Will allow for mailing so will write early and then mail when ready tomorrow.   Follow-up by: Gladis Riffle, RN,  May 06, 2009 2:26 PM  Additional Follow-up for Phone Call Additional follow up Details #1::        Rx ready and will be mailed today or tomorrow.Patient notified.  Additional Follow-up by: Gladis Riffle, RN,  May 07, 2009 11:47 AM    Prescriptions: ENDOCET 10-325 MG  TABS (OXYCODONE-ACETAMINOPHEN) Take 1 tablet by mouth two times a day  #60 x 0   Entered by:   Gladis Riffle, RN   Authorized by:   [name] MD   Signed by:   Gladis Riffle, RN on 05/06/2009   Method used:   Print then Give to Patient   RxID:   [id number]

## 2010-06-02 NOTE — Assessment & Plan Note (Signed)
Summary: 6 month follow up/cjr   Vital Signs:  Patient profile:   70 year old male Weight:      198 pounds Temp:     98.4 degrees F oral Pulse rate:   64 / minute Pulse rhythm:   regular BP sitting:   150 / 86  (left arm) Cuff size:   regular  Vitals Entered By: Sydell Axon LPN (March 15, 2010 8:51 AM) CC: 6 Month follow-up   CC:  6 Month follow-up.  History of Present Illness: Insomnia---using benadryl at bedtime. started after poison ivy flare  htn---home BPS 130s/80s  pancreatitisi---sxs controlled with pain meds  colonoscopy---scheduled with dr stark next year.   All other systems reviewed and were negative   Allergies: No Known Drug Allergies  Physical Exam  General:  alert and well-developed.   Head:  normocephalic and atraumatic.   Eyes:  pupils equal and pupils round.   Neck:  No deformities, masses, or tenderness noted. Chest Wall:  no deformities and no tenderness.   Lungs:  normal respiratory effort and no intercostal retractions.   Heart:  regular rhythm and no murmur.   Abdomen:  Bowel sounds positive,abdomen soft and non-tender without masses, organomegaly  Skin:  turgor normal and color normal.   Psych:  normally interactive and good eye contact.     Impression & Recommendations:  Problem # 1:  PANCREATITIS (ICD-577.0) sxs well controlled Rx given for pain meds.   Problem # 2:  HYPERTENSION (ICD-401.9)  controlled at home he will continue to monitor  His updated medication list for this problem includes:    Dynacirc Cr 10 Mg Xr24h-tab (Isradipine) .Marland Kitchen... Take 1 tablet by mouth once a day    Lisinopril 20 Mg Tabs (Lisinopril) .Marland Kitchen... 1 tablet by mouth daily  BP today: 150/86 Prior BP: 144/70 (08/03/2009)  Labs Reviewed: K+: 4.9 (08/03/2009) Creat: : 1.0 (08/03/2009)   Chol: 133 (08/03/2009)   HDL: 45.10 (08/03/2009)   LDL: 50 (08/03/2009)   TG: 188.0 (08/03/2009)  Problem # 3:  HYPERLIPIDEMIA (ICD-272.4)  His updated medication  list for this problem includes:    Crestor 10 Mg Tabs (Rosuvastatin calcium) .Marland Kitchen... Take 1 tablet by mouth once a day  Labs Reviewed: SGOT: 33 (08/03/2009)   SGPT: 26 (08/03/2009)   HDL:45.10 (08/03/2009), 59.80 (03/25/2009)  LDL:50 (08/03/2009), 84 (03/25/2009)  Chol:133 (08/03/2009), 165 (03/25/2009)  Trig:188.0 (08/03/2009), 107.0 (03/25/2009)  Complete Medication List: 1)  Crestor 10 Mg Tabs (Rosuvastatin calcium) .... Take 1 tablet by mouth once a day 2)  Dynacirc Cr 10 Mg Xr24h-tab (Isradipine) .... Take 1 tablet by mouth once a day 3)  Hydrocodone-acetaminophen 5-500 Mg Tabs (Hydrocodone-acetaminophen) .... One every 4 hours during waking hours 4)  Vitamin C 500 Mg Tabs (Ascorbic acid) .... Take 1 once a day 5)  Aspirin 81 Mg Tbec (Aspirin) .... Once daily 6)  Endocet 10-325 Mg Tabs (Oxycodone-acetaminophen) .... Take 1 tablet by mouth two times a day 7)  Lisinopril 20 Mg Tabs (Lisinopril) .Marland Kitchen.. 1 tablet by mouth daily 8)  Phillips Stool Softener  .... As needed 9)  Creon 12000 Unit Cpep (Pancrelipase (lip-prot-amyl)) .... Take 2 capsules by mouth three times a day 10)  Fish Oil 1000 Mg Caps (Omega-3 fatty acids) .... Take one by mouth daily 11)  Benadryl 25 Mg Caps (Diphenhydramine hcl) .... Take 2 by mouth at bedtime  Other Orders: Flu Vaccine 56yrs + MEDICARE PATIENTS (Z6109) Administration Flu vaccine - MCR (U0454) Prescriptions: ENDOCET 10-325 MG  TABS (OXYCODONE-ACETAMINOPHEN)  Take 1 tablet by mouth two times a day  #60 x 0   Entered and Authorized by:   [name] MD   Signed by:   [name] MD on 03/15/2010   Method used:   Print then Give to Patient   RxID:   [id number]    Orders Added: 1)  Flu Vaccine 81yrs + MEDICARE PATIENTS [Q2039] 2)  Administration Flu vaccine - MCR [G0008] 3)  Est. Patient Level IV [86578]    Current Allergies (reviewed today): No known allergies    Flu Vaccine Consent Questions     Do you have a history of severe allergic  reactions to this vaccine? no    Any prior history of allergic reactions to egg and/or gelatin? no    Do you have a sensitivity to the preservative Thimersol? no    Do you have a past history of Guillan-Barre Syndrome? no    Do you currently have an acute febrile illness? no    Have you ever had a severe reaction to latex? no    Vaccine information given and explained to patient? yes    Are you currently pregnant? no    Lot Number:[id number]   Exp Date:10/30/2010   Site Given  Left Deltoid IMbmedflu

## 2010-06-02 NOTE — Progress Notes (Signed)
Summary: endocet  Phone Note Call from Patient   Caller: Patient [phone] Reason for Call: Refill Medication Summary of Call: Pt called req refill Endocet 10-325mg  mg  #60..... Pt can be reached  [phone] with any questions or concerns.  Pt req that same be mailed to him (pt states that he gave SASE to Alvino Chapel, Charity fundraiser and  normally gets RX's mailed to him by Alvino Chapel, Charity fundraiser) at:   [facility] [address] [place], [place]  [address]  Initial call taken by: Debbra Riding,  June 02, 2009 3:30 PM  Follow-up for Phone Call        last done 05/06/09.Gladis Riffle, RN  June 02, 2009 4:12 PM   Rx printed and will be signed and mailed tomorrow.Gladis Riffle, RN  June 03, 2009 2:48 PM   Rx signed and ready to be mailed. Patient notified.  Follow-up by: Gladis Riffle, RN,  June 04, 2009 10:54 AM    Prescriptions: ENDOCET 10-325 MG  TABS (OXYCODONE-ACETAMINOPHEN) Take 1 tablet by mouth two times a day  #60 x 0   Entered by:   Gladis Riffle, RN   Authorized by:   [name] MD   Signed by:   Gladis Riffle, RN on 06/03/2009   Method used:   Print then Give to Patient   RxID:   [id number]

## 2010-06-02 NOTE — Letter (Signed)
Summary: Colonoscopy Letter  [facility]  [address] [place], [place] [address]   Phone: [phone]  Fax: [phone]      December 30, 2009 MRN: [id number]   [name] [address], [place]  [address]   Dear [name],   According to your medical record, it is time for you to schedule a Colonoscopy. The American Cancer Society recommends this procedure as a method to detect early colon cancer. Patients with a family history of colon cancer, or a personal history of colon polyps or inflammatory bowel disease are at increased risk.  This letter has beeen generated based on the recommendations made at the time of your procedure. If you feel that in your particular situation this may no longer apply, please contact our office.  Please call our office at [phone] to schedule this appointment or to update your records at your earliest convenience.  Thank you for cooperating with [place] to provide you with the very best care possible.   Sincerely,  [name] T. Russella Dar, M.D.  [facility] [phone]

## 2010-06-02 NOTE — Medication Information (Signed)
Summary: [facility]  [facility]   Imported By: Lennie Odor 10/22/2009 10:47:25  _____________________________________________________________________  External Attachment:    Type:   Image     Comment:   External Document

## 2010-06-03 NOTE — Progress Notes (Signed)
Summary: Pt req script for Endocet. Mail to pt in [place]  Phone Note Refill Request Call back at [facility] Phone [phone]   Refills Requested: Medication #1:  ENDOCET 10-325 MG  TABS Take 1 tablet by mouth two times a day   Dosage confirmed as above?Dosage Confirmed Pt is in [place] and says that Dr Cato Mulligan nurse needs to mail script for [facility]. Pt says nurse has address in [place] to send script.   Method Requested: Mail to Patient Initial call taken by: Lucy Antigua,  April 15, 2010 10:16 AM  Follow-up for Phone Call        Pt called in to ck on status of request.  Follow-up by: Debbra Riding,  April 16, 2010 4:07 PM  Additional Follow-up for Phone Call Additional follow up Details #1::        okay    Additional Follow-up for Phone Call Additional follow up Details #2::    rx mailed  Follow-up by: Alfred Levins, CMA,  April 19, 2010 10:36 AM  Prescriptions: ENDOCET 10-325 MG  TABS (OXYCODONE-ACETAMINOPHEN) Take 1 tablet by mouth two times a day  #60 x 0   Entered by:   Alfred Levins, CMA   Authorized by:   [name] MD   Signed by:   Alfred Levins, CMA on 04/19/2010   Method used:   Print then Give to Patient   RxID:   [id number]

## 2010-06-03 NOTE — Telephone Encounter (Signed)
Ok to call in.. Same  # and efills as usual---chronic pancreatitis

## 2010-06-03 NOTE — Progress Notes (Signed)
Summary: refill  Phone Note Refill Request Call back at Home Phone [phone] Message from:  Patient---live call  Refills Requested: Medication #1:  HYDROCODONE-ACETAMINOPHEN 5-500 MG TABS one every 4 hours during waking hours call pharmacy---[phone]  Initial call taken by: Warnell Forester,  May 04, 2010 9:48 AM  Follow-up for Phone Call        called in rx last week for 30 pills in [place] and he is coming back in town at the end of the week and is going to be out of meds Follow-up by: Alfred Levins, CMA,  May 04, 2010 3:22 PM

## 2010-06-03 NOTE — Progress Notes (Signed)
Summary: Pt req Hydrocodone to [place] [facility] in [place]  Phone Note Refill Request Call back at [facility] [phone] Message from:  Patient  Refills Requested: Medication #1:  HYDROCODONE-ACETAMINOPHEN 5-500 MG TABS one every 4 hours during waking hours   Dosage confirmed as above?Dosage Confirmed   Supply Requested: 1 month Pt out of town. Pls call this in to [facility] in [facility]  Phone # [phone].     Method Requested: Telephone to [facility] in [place]. Initial call taken by: Lucy Antigua,  April 27, 2010 8:44 AM  Follow-up for Phone Call        Rx called to pharmacy Follow-up by: Alfred Levins, CMA,  April 27, 2010 1:24 PM    Prescriptions: HYDROCODONE-ACETAMINOPHEN 5-500 MG TABS (HYDROCODONE-ACETAMINOPHEN) one every 4 hours during waking hours  #30 x 0   Entered by:   Alfred Levins, CMA   Authorized by:   [name] MD   Signed by:   Alfred Levins, CMA on 04/27/2010   Method used:   Telephoned to ...       [facility] (retail)       [address]       [place], [place]  [address]       Ph: [phone] or [phone]       Fax: [phone]   RxID:   [id number]

## 2010-06-03 NOTE — Progress Notes (Signed)
Summary: rx mail  Phone Note Refill Request Call back at Home Phone [phone] Message from:  Patient  Refills Requested: Medication #1:  ENDOCET 10-325 MG  TABS Take 1 tablet by mouth two times a day pt in [place] please mail rx  Initial call taken by: Heron Sabins,  May 14, 2010 8:37 AM  Follow-up for Phone Call        Pt called back to check on status of Endocet. Pls send asap to [place] and call pt when this has been done.  Follow-up by: Lucy Antigua,  May 17, 2010 10:01 AM  Additional Follow-up for Phone Call Additional follow up Details #1::        Rx mailed to patient Additional Follow-up by: Alfred Levins, CMA,  May 18, 2010 11:17 AM    Prescriptions: ENDOCET 10-325 MG  TABS (OXYCODONE-ACETAMINOPHEN) Take 1 tablet by mouth two times a day  #60 x 0   Entered by:   Alfred Levins, CMA   Authorized by:   [name] MD   Signed by:   Alfred Levins, CMA on 05/18/2010   Method used:   Print then Mail to Patient   RxID:   [id number]

## 2010-06-04 MED ORDER — HYDROCODONE-ACETAMINOPHEN 7.5-500 MG PO TABS: 1.0000 | ORAL_TABLET | ORAL | Status: DC | PRN

## 2010-06-04 NOTE — Telephone Encounter (Signed)
Called in rx, pt aware

## 2010-06-14 ENCOUNTER — Telehealth: Payer: Self-pay | Admitting: Internal Medicine

## 2010-06-14 DIAGNOSIS — R52 Pain, unspecified: Secondary | ICD-10-CM

## 2010-06-14 NOTE — Telephone Encounter (Signed)
Refill Indocet. Please mail using his personal envelopes. Thanks.

## 2010-06-16 MED ORDER — HYDROCODONE-ACETAMINOPHEN 7.5-500 MG PO TABS: 1.0000 | ORAL_TABLET | ORAL | Status: DC | PRN

## 2010-06-16 NOTE — Telephone Encounter (Deleted)
Is this ok to refill?  

## 2010-06-16 NOTE — Telephone Encounter (Signed)
rx mailed to pt

## 2010-06-21 ENCOUNTER — Other Ambulatory Visit: Payer: Self-pay | Admitting: Internal Medicine

## 2010-06-21 DIAGNOSIS — R52 Pain, unspecified: Secondary | ICD-10-CM

## 2010-06-21 MED ORDER — OXYCODONE-ACETAMINOPHEN 10-325 MG PO TABS: 1.0000 | ORAL_TABLET | Freq: Two times a day (BID) | ORAL | Status: DC

## 2010-06-29 ENCOUNTER — Telehealth: Payer: Self-pay | Admitting: *Deleted

## 2010-06-29 DIAGNOSIS — R52 Pain, unspecified: Secondary | ICD-10-CM

## 2010-06-29 NOTE — Telephone Encounter (Signed)
Pt never received rx in the mail for oxycodone.  Wants [place] to send another rx in the mail to [place].  Also he has taken all of his hydrocodone because he did not have any oxycodone and wants another refill called into [facility] [phone]

## 2010-06-30 MED ORDER — HYDROCODONE-ACETAMINOPHEN 7.5-500 MG PO TABS: 1.0000 | ORAL_TABLET | ORAL | Status: DC | PRN

## 2010-06-30 MED ORDER — OXYCODONE-ACETAMINOPHEN 10-325 MG PO TABS: 1.0000 | ORAL_TABLET | Freq: Two times a day (BID) | ORAL | Status: DC

## 2010-06-30 NOTE — Telephone Encounter (Signed)
Per Dr Cato Mulligan ok to mail new rx, I checked with the pharmacy and no rx has been filled.  Pt is to mail the old rx back to [place] if he receives it.  Also called in a new rx of the hydrocodone

## 2010-07-19 ENCOUNTER — Telehealth: Payer: Self-pay | Admitting: *Deleted

## 2010-07-19 MED ORDER — OXYCODONE-ACETAMINOPHEN 10-325 MG PO TABS: 1.0000 | ORAL_TABLET | Freq: Two times a day (BID) | ORAL | Status: AC

## 2010-07-19 NOTE — Telephone Encounter (Signed)
Printed and mailed to pt in [place]

## 2010-07-30 ENCOUNTER — Other Ambulatory Visit: Payer: Self-pay | Admitting: *Deleted

## 2010-07-30 DIAGNOSIS — R52 Pain, unspecified: Secondary | ICD-10-CM

## 2010-07-30 MED ORDER — HYDROCODONE-ACETAMINOPHEN 7.5-500 MG PO TABS: 1.0000 | ORAL_TABLET | ORAL | Status: DC | PRN

## 2010-08-05 ENCOUNTER — Encounter: Payer: Self-pay | Admitting: Internal Medicine

## 2010-08-09 ENCOUNTER — Encounter: Payer: Self-pay | Admitting: Internal Medicine

## 2010-08-09 ENCOUNTER — Ambulatory Visit (INDEPENDENT_AMBULATORY_CARE_PROVIDER_SITE_OTHER): Payer: Medicare Other | Admitting: Internal Medicine

## 2010-08-09 DIAGNOSIS — K859 Acute pancreatitis without necrosis or infection, unspecified: Secondary | ICD-10-CM

## 2010-08-09 DIAGNOSIS — I10 Essential (primary) hypertension: Secondary | ICD-10-CM

## 2010-08-09 DIAGNOSIS — E785 Hyperlipidemia, unspecified: Secondary | ICD-10-CM

## 2010-08-09 LAB — BASIC METABOLIC PANEL
Chloride: 100 mEq/L (ref 96–112)
GFR: 80.99 mL/min (ref >60.00)
Glucose, Bld: 108 mg/dL — ABNORMAL HIGH (ref 70–99)
Potassium: 4.6 mEq/L (ref 3.5–5.1)
Sodium: 137 mEq/L (ref 135–145)

## 2010-08-09 LAB — LIPID PANEL
HDL: 46.1 mg/dL (ref >39.00)
LDL Cholesterol: 68 mg/dL (ref 0–99)
VLDL: 35.2 mg/dL (ref 0.0–40.0)

## 2010-08-09 LAB — HEPATIC FUNCTION PANEL
ALT: 25 U/L (ref 0–53)
Total Bilirubin: 0.4 mg/dL (ref 0.3–1.2)
Total Protein: 7.3 g/dL (ref 6.0–8.3)

## 2010-08-09 MED ORDER — ISRADIPINE 10 MG PO TB24: 10.0000 mg | ORAL_TABLET | Freq: Every day | ORAL | Status: DC

## 2010-08-09 NOTE — Assessment & Plan Note (Signed)
Reasonable control Continue current meds  

## 2010-08-09 NOTE — Progress Notes (Signed)
  Subjective:    Patient ID: [name], male    DOB: 1941-03-21, 70 y.o.   MRN: [id number]  HPI  Chronic pancreatitis---still with pain, interferes with sleep. He is eating reasonably well.   HTN---insurance requests generic isradapine  Lipids---needs f/u  Left foot pain--typically after playin golf 5 days in a row---"top of foot". Pain can be severe enough to "make me limp".  Fatigue---better recently  Past Medical History  Diagnosis Date  . Hx of colonic polyps   . Diverticulosis   . Hypertension   . Peripheral vascular disease   . Pancreatitis, alcoholic   . Hyperlipidemia    Past Surgical History  Procedure Date  . Knee surgery 1961    left  . Cholecystectomy 1992    complications of infection, hopitalized 5 months  . Stent legs   . Hernia repair   . Wrist fracture surgery   . Cataract extraction     reports that he has quit smoking. He does not have any smokeless tobacco history on file. He reports that he does not drink alcohol. His drug history not on file. family history includes Heart attack in his father and Heart disease in his mother. No Known Allergies   Review of Systems  patient denies chest pain, shortness of breath, orthopnea. Denies lower extremity edema, abdominal pain, change in appetite, change in bowel movements. Patient denies rashes, musculoskeletal complaints. No other specific complaints in a complete review of systems.      Objective:   Physical Exam  well-developed well-nourished male in no acute distress. HEENT exam atraumatic, normocephalic, neck supple without jugular venous distention. Chest clear to auscultation cardiac exam S1-S2 are regular. Abdominal exam overweight with bowel sounds, soft and nontender. Extremities no edema. Neurologic exam is alert with a normal gait.        Assessment & Plan:

## 2010-08-09 NOTE — Assessment & Plan Note (Signed)
Needs f/u labs Check today 

## 2010-08-09 NOTE — Assessment & Plan Note (Signed)
Tolerating intermittent discomfort Uses oxycodone and hydrocodone Has not required hopitalization

## 2010-08-10 LAB — DIFFERENTIAL
Eosinophils Relative: 1 % (ref 0–5)
Lymphocytes Relative: 14 % (ref 12–46)
Lymphs Abs: 1.1 10*3/uL (ref 0.7–4.0)
Monocytes Relative: 6 % (ref 3–12)

## 2010-08-10 LAB — CBC
RBC: 4.09 MIL/uL — ABNORMAL LOW (ref 4.22–5.81)
WBC: 7.8 10*3/uL (ref 4.0–10.5)

## 2010-08-10 LAB — COMPREHENSIVE METABOLIC PANEL
ALT: 22 U/L (ref 0–53)
AST: 24 U/L (ref 0–37)
Alkaline Phosphatase: 56 U/L (ref 39–117)
CO2: 26 mEq/L (ref 19–32)
Chloride: 104 mEq/L (ref 96–112)
GFR calc Af Amer: >60 mL/min (ref >60)
GFR calc non Af Amer: >60 mL/min (ref >60)
Sodium: 139 mEq/L (ref 135–145)
Total Bilirubin: 0.6 mg/dL (ref 0.3–1.2)

## 2010-08-10 LAB — PROTIME-INR
INR: 1.1 (ref 0.00–1.49)
Prothrombin Time: 14.1 seconds (ref 11.6–15.2)

## 2010-08-11 ENCOUNTER — Other Ambulatory Visit: Payer: Self-pay | Admitting: Internal Medicine

## 2010-08-18 ENCOUNTER — Telehealth: Payer: Self-pay | Admitting: Internal Medicine

## 2010-08-18 MED ORDER — OXYCODONE-ACETAMINOPHEN 10-325 MG PO TABS: 1.0000 | ORAL_TABLET | ORAL | Status: DC | PRN

## 2010-08-18 NOTE — Telephone Encounter (Signed)
Pt needs new rx percocet 10-325

## 2010-08-18 NOTE — Telephone Encounter (Signed)
rx up front ready for p/u, pt aware 

## 2010-08-19 ENCOUNTER — Other Ambulatory Visit: Payer: Self-pay | Admitting: *Deleted

## 2010-08-19 DIAGNOSIS — R52 Pain, unspecified: Secondary | ICD-10-CM

## 2010-08-19 MED ORDER — OXYCODONE-ACETAMINOPHEN 10-325 MG PO TABS: 1.0000 | ORAL_TABLET | Freq: Two times a day (BID) | ORAL | Status: DC

## 2010-08-19 NOTE — Telephone Encounter (Signed)
rx was written for the oxycodone, changed the directions and qty and pt will come back and pick up.

## 2010-08-25 ENCOUNTER — Other Ambulatory Visit: Payer: Self-pay | Admitting: Internal Medicine

## 2010-08-26 ENCOUNTER — Telehealth: Payer: Self-pay | Admitting: Internal Medicine

## 2010-08-26 DIAGNOSIS — R52 Pain, unspecified: Secondary | ICD-10-CM

## 2010-08-26 NOTE — Telephone Encounter (Signed)
Pt called req refill of HYDROcodone-acetaminophen (LORTAB) 7.5-500 MG [facility]. Pt is leaving to go out of town tomorrow. Pls call in asap.

## 2010-08-27 MED ORDER — HYDROCODONE-ACETAMINOPHEN 7.5-500 MG PO TABS: 1.0000 | ORAL_TABLET | ORAL | Status: DC | PRN

## 2010-08-27 NOTE — Telephone Encounter (Signed)
Talked to Dr Cato Mulligan and he gave verbal ok to refill

## 2010-08-27 NOTE — Telephone Encounter (Addendum)
His last refill was 07/30/10 and he got 100 tablets.  I declined it once but the pt is calling, he is leaving town Sunday

## 2010-09-14 NOTE — Op Note (Signed)
NAMEGABERIEL, [name]               ACCOUNT NO.:  [id number]   MEDICAL RECORD NO.:  [id number]          PATIENT TYPE:  AMB   LOCATION:  SDS                          FACILITY:  [facility]   PHYSICIAN:  Adolph Pollack, M.D.DATE OF BIRTH:  1940/07/01   DATE OF PROCEDURE:  09/23/2008  DATE OF DISCHARGE:                               OPERATIVE REPORT   PREOPERATIVE DIAGNOSIS:  Left inguinal hernia.   POSTOPERATIVE DIAGNOSIS:  Indirect left inguinal hernia.   PROCEDURE:  Left inguinal hernia repair with mesh.   SURGEON:  Adolph Pollack, MD   ANESTHESIA:  General plus 0.5% Marcaine local.   INDICATIONS:  Mr. Lekas is a 70 year old male who has a left inguinal  hernia that is symptomatic causing him pain.  He is interested in  repair, presented to see me in the office, and now presents for elective  repair.  We discussed the procedure risks and aftercare preoperatively.   TECHNIQUE:  He was seen in the holding area, then the left groin was  marked with my initials.  He was then brought to the operating room,  placed supine on the operating table, and general anesthetic was  administered.  The hair on the lower abdominal wall and left groin was  clipped and the area sterilely prepped and draped.   In the left inguinal area, local anesthetic was infiltrated  superficially and deep.  A left inguinal incision was then made through  the skin, subcutaneous tissue until the external oblique aponeurosis was  identified.  Local anesthetic was infiltrated deep to the external  oblique aponeurosis.  An incision was made in the external oblique  aponeurosis through the external ring medially up toward the anterior  superior iliac spine laterally.  Using blunt dissection, I exposed the  shelving edge of the inguinal ligament inferiorly and the internal  oblique muscle and aponeurosis superiorly.  The ilioinguinal and  iliohypogastric nerves were identified and preserved.   I isolated the  spermatic cord and made posterior window around it.  An  indirect hernia sac with extraperitoneal fatty contents also was noted  to be coming through a patulous internal ring consistent with an  indirect hernia.  I dissected the extraperitoneal fat free from the  cord, excised, and sent in as hernia contents.  I then dissected the  peritoneal sac free from the spermatic cord and reduced it back to the  patulous internal ring.   Following this, a piece of 3 x 6 inches mass was brought into the field.  It was anchored 1-2 cm medial to the pubic tubercle with 2-0 Prolene  suture.  The inferior aspect of the mesh was then anchored to the  shelving edge of the inguinal ligament with a running 2-0 Prolene suture  to level 2 cm lateral to the internal ring.  A slit was cut in the mesh  creating two tails which were then wrapped around the spermatic cord.  The superior aspect of mesh was then anchored to the internal oblique  aponeurosis with interrupted 2-0 Vicryl sutures.  The two tails of mesh  were crossed  creating a new internal ring.  These were then anchored to  the shelving edge of the inguinal ligament with a 2-0 Prolene suture.  The tip of the hemostat was able to be placed through the new aperture.   Following this, I inspected the wound and hemostasis was adequate.  The  lateral aspect of the mesh was then tucked deep to the external oblique  aponeurosis.  The external oblique aponeurosis was then closed over the  mesh and cord with running 3-0 Vicryl suture.  The subcutaneous tissue  was approximated with running 2-0 Vicryl suture.  The skin was closed  with a running 4-0 Monocryl subcuticular stitch followed by Steri-Strips  and sterile dressing.  He tolerated the procedure well without any  apparent complications.  He was taken to recovery room in satisfactory  condition.  The left testicle was in its normal position in the scrotum.      Adolph Pollack, M.D.   Electronically Signed     TJR/MEDQ  D:  09/23/2008  T:  09/23/2008  Job:  [id number]   cc:   [name]. [name], MD

## 2010-09-15 ENCOUNTER — Other Ambulatory Visit: Payer: Self-pay | Admitting: Internal Medicine

## 2010-09-15 DIAGNOSIS — R52 Pain, unspecified: Secondary | ICD-10-CM

## 2010-09-15 NOTE — Telephone Encounter (Signed)
Pt needs new rx generic percocet 10-325mg 

## 2010-09-16 MED ORDER — OXYCODONE-ACETAMINOPHEN 10-325 MG PO TABS: 1.0000 | ORAL_TABLET | Freq: Two times a day (BID) | ORAL | Status: DC

## 2010-09-16 NOTE — Telephone Encounter (Signed)
rx up front ready for p/u, pt aware 

## 2010-09-17 NOTE — Discharge Summary (Signed)
NAMEKEROLOS, NEHME               ACCOUNT NO.:  [id number]   MEDICAL RECORD NO.:  [id number]          PATIENT TYPE:  INP   LOCATION:  4729                         FACILITY:  [facility]   PHYSICIAN:  Titus Dubin. Alwyn Ren, M.D. [facility] OF BIRTH:  1940/10/14   DATE OF ADMISSION:  11/12/2005  DATE OF DISCHARGE:  11/13/2005                                 DISCHARGE SUMMARY   ADDENDUM:  Mr. Dirocco CT scan final report was not available at the time  of the discharge.  His D-dimer had been mildly elevated.  The CT scan does  not show any evidence of PTE.  Significant findings included marked  arthrosclerosis of the aorta.  He also had a dilated esophagus suggesting  possible reflux.  There was increased soft tissue density in the head of the  pancreas.  These findings will need to be monitored; reconsultation by Dr.  Claudette Head or possibly Dr. Christella Hartigan would be considered.  As noted, the  possibility of a celiac plexus blockage to control his chronic daily pain  with intermittent exacerbations was  discussed with the patient.      Titus Dubin. Alwyn Ren, M.D. [facility]  Electronically Signed     WFH/MEDQ  D:  11/13/2005  T:  11/13/2005  Job:  [id number]   cc:   Venita Lick. Russella Dar, M.D. [facility]  Bruce H. [name], M.D. [facility]

## 2010-09-17 NOTE — Discharge Summary (Signed)
[name], [name]               ACCOUNT NO.:  [id number]   MEDICAL RECORD NO.:  [id number]          PATIENT TYPE:  INP   LOCATION:  5731                         FACILITY:  [facility]   PHYSICIAN:  Barbette Hair. Artist Pais, DO      DATE OF BIRTH:  [birth year]   DATE OF ADMISSION:  11/15/2005  DATE OF DISCHARGE:  11/19/2005                                 DISCHARGE SUMMARY   DISCHARGE DIAGNOSES:  1. Chronic pancreatitis.  2. Abnormal pancreatic head.  3. Hypertension.  4. Left conjunctivitis.   DISCHARGE MEDICATIONS:  1. Aspirin 81 mg once a day.  2. Lipitor 10 mg at bedtime.  Last 2 medications were to be held.  3. Diaserp 5 mg once daily.  4. Polymyxin B-trimethoprim 1 drop left eye 4 times daily.  5. Oxycodone 5 mg 1-2 tablets every 6 hours as needed.   FOLLOWUP INSTRUCTIONS:  She was told to followup with Dr. Cato Mulligan in 1 week.  She was to call for an appointment and also followup with Dr. Russella Dar.  She  was to contact Dr. Russella Dar for followup appointment.   HOSPITAL COURSE:  The patient is a 70 year old male with a history of  pancreatitis and remote alcohol use being admitted for recurrent abdominal  pain presumed secondary to chronic pancreatitis.  The patient failed  outpatient therapy of Vicodin and was admitted for further workup and IV  pain medications.   She was seen by Dr. Coralie Carpen with GI and noted abnormal CAT scan November 12, 2005  which showed fullness in the pancreatic head and process.  There were also  some calcifications and question whether this was focal pancreatitis or  malignancy.  Also noted was dilated pancreatic duct consistent with chronic  pancreatitis.   An endoscopic ultrasound with a biopsy was felt to be the next course.  The  patient underwent procedure by Dr. Christella Hartigan with the following results:  Chronic pancreatic changes throughout the gland, fine needle aspiration  taken from abnormal head of pancreas, randomly no discrete mass was seen and  the pancreatic  duct was not dilated as seen on recent CT scan.   The patient's abdominal pain improved with oxycodone and Lipitor was  discontinued as it was felt this was possibly exacerbating her pancreatitis.   CONDITION ON DISCHARGE:  Her abdominal pain was improved and was stable on  oxycodone therapy.   LABORATORY DATA:  The patient did have elevated CA19-9 of 71.1.   H and H on discharge; hemoglobin was 12.9, hematocrit was 38.3, WBC 7.6,  platelet count of 161.  Comprehensive metabolic profile was within normal  limits.      Barbette Hair. Artist Pais, DO  Electronically Signed     RDY/MEDQ  D:  12/21/2005  T:  12/21/2005  Job:  [id number]

## 2010-09-17 NOTE — Op Note (Signed)
NAMEKELDRIC, POYER               ACCOUNT NO.:  [id number]   MEDICAL RECORD NO.:  [id number]          PATIENT TYPE:  OIB   LOCATION:  2899                         FACILITY:  [facility]   PHYSICIAN:  Salvadore Farber, M.D. LHCDATE OF BIRTH:  01-May-1941   DATE OF PROCEDURE:  DATE OF DISCHARGE:                                 OPERATIVE REPORT   DATE OF PROCEDURE:  January 23, 2004.   PROCEDURE:  Selective left iliac angiography, bilateral lower extremity  angiography, abdominal aortography.   SURGEON:  Salvadore Farber, MD, [facility].   INDICATION:  Mr. Blanchard is a 70 year old gentleman, who presented with  lifestyle limited claudication of his left leg presenting as left calf  discomfort solely with exertion.  Physical exam and duplex ultrasonography  suggested left iliac disease.  ABI was 0.8 on the left and 0.91 on the  right.  He is referred for diagnostic angiography with an eye to  percutaneous revascularization.   PROCEDURAL TECHNIQUE:  Informed consent was obtained.  After 1% lidocaine  local anesthesia, a 5-French sheath was placed in the right common femoral  artery using the modified Seldinger technique.  A pigtail catheter was  advanced over wire and positioned in the suprarenal abdominal aorta.  Abdominal aortography was performed by power injection.  The pigtail  catheter was then pulled back to the infrarenal abdominal aorta.  Pelvic  angiography was performed by power injection.  I was unable to fit both legs  within the field of view to perform simultaneous angiography.  Therefore,  the left common iliac artery was selected using a LIMA catheter.  A 4-French  __________ catheter was then advanced into the common iliac artery.  Angiography of the entirety of the vasculature of the leg was performed by  power injection using step-table technique and digital subtraction  technology.  Finally, the imaging of the entirety of the vasculature of the  right leg was then  performed via power injection from the sheath in step-  table technique.  The patient tolerated the procedure well and was  transferred to the holding room in stable condition.   COMPLICATIONS:  None.   FINDINGS:  1.  Abdominal aorta:  Mild plaquing without significant stenosis.  2.  Renal arteries:  Single renal arteries bilaterally.  The right has mild      atherosclerotic plaquing of the proximal vessel without significant      stenosis.  There is then a long segment of fibromuscular dysplasia in      the mid vessel.  The left renal artery is normal.  3.  Left leg:  Mild disease of the proximal common iliac artery.  The      external iliac is occluded from the take-off of the internal down      through the common femoral artery to just above the bifurcation of the      SFA and profunda.  The internal iliac has a 60% proximal stenosis.  The      profunda is totally occluded and fills via collaterals.  The SFA has      diffuse,  mild disease along its course as does the popliteal.  There is      three-vessel runoff to the foot.  4.  Right leg:  Diffuse, mild disease of the common and external iliac.  The      internal iliac is widely patent.  The profunda is widely patent.  The      SFA has diffuse, mild disease along its course.  There is three-vessel      runoff to the foot.   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  Will plan a trial of  conservative therapy with Pletal 100 mg b.i.d.  Patient to follow up with me  in two weeks to assess response to therapy.  If inadequate response, will  need to consider surgical revascularization.       WED/MEDQ  D:  01/23/2004  T:  01/24/2004  Job:  [id number]

## 2010-09-17 NOTE — Consult Note (Signed)
NAME:  JOELLE, FLESSNER NO.:  [id number]   MEDICAL RECORD NO.:  [id number]          PATIENT TYPE:  INP   LOCATION:  5731                         FACILITY:  [facility]   PHYSICIAN:  Iva Boop, M.D. LHCDATE OF BIRTH:  25-Nov-1940   DATE OF CONSULTATION:  DATE OF DISCHARGE:                                   CONSULTATION   PRIMARY CARE PHYSICIANS:  [name], M.D. and gastroenterologist Claudette Head, M.D.   REASON FOR CONSULTATION:  Abdominal pain/pancreatitis with enlarged pancreas  on CT scan.   ASSESSMENT:  Fullness in the pancreatic head and uncinate process on CT  scanning of November 12, 2005.  There were calcifications there.  Question  whether this is focal pancreatitis or a malignancy.  There is a dilated  pancreatic duct consistent with chronic pancreatitis as well.   RECOMMENDATIONS AND PLAN:  1.  Continue medical care of pancreatitis.  2.  I think an endoscopic ultrasound with or without biopsy would be the      next best test.  The question is what is the appropriate timing.  If he      is improving clinically, we might want to defer that but I will discuss      with Dr. Christella Hartigan as to the appropriate timing, (i.e., soon versus waiting      a few weeks to see if whatever edema is in there would subside).  I have      explained this to the patient.   HISTORY:  A 70 year old white man admitted for observation July 14, July 15  with abdominal pain and the CT findings as above.  Treated as pancreatitis.  He got better then he had recurrent problems with pain and was readmitted.  His lipase was 172 on November 11, 2005 with normal LFTs and 96 on November 12, 2005, 56 on November 13, 2005, 34 today.  He describes epigastric and some mild  right upper quadrant pain and pain radiating into the back at times.  He has  had some nausea but no vomiting.  He really cannot eat due to the pain.  Since coming into the hospital, he is improved on pain medication but still  has  some pain overall.   MEDICATIONS:  1.  Aspirin 81 mg daily.  2.  Lipitor 10 mg daily.  3.  Vicodin.  4.  DynaCirc.  5.  Duragesic patch, not sure if he left that on though.   Here in the hospital, medications are:  1.  Dilaudid 1 to 2 mg every three hours as needed.  2.  Aspirin 81 mg a day, being held.  3.  Lipitor 10 mg a day, being held.  4.  DynaCirc 5 mg daily.  5.  Lovenox 40 mg subcutaneous 24 hours.  6.  Protonix 40 mg daily.   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  He has  not had alcohol in about 12 years.  He has lipidemia, peripheral artery  disease.   SOCIAL HISTORY:  He lives in [place].  No tobacco.  FAMILY HISTORY:  Noncontributory.   REVIEW OF SYSTEMS:  As above.  He does wear eyeglasses.  He has joint pain.  All other systems appear negative.   ADDITIONAL MEDICAL HISTORY:  Includes prior cholecystectomy with  complication leading to a six-month hospitalization, knee surgery x2,  bilateral cataract surgery.   Previous MRCP in November 2006 shows stable dilated pancreatic duct without  mass lesions.  Normal common bile duct.   PHYSICAL:  Well-developed middle-aged white man.  VITALS:  Temperature 97.1, pulse 56, respirations 16, blood pressure 125/80,  190 pounds.  EYES:  Anicteric.  MOUTH is free of lesions.  NECK:  Supple, no mass.  CHEST:  Clear.  HEART:  S1/S2, no murmurs or gallops.  ABDOMEN:  Soft, tender in the epigastric and upper quadrants bilaterally  without organomegaly or mass.  EXTREMITIES:  Without edema.  PSYCH:  He is oriented x3.  LYMPH NODES:  No neck, supraclavicular, or groin adenopathy detected.  SKIN is warm and dry without acute rash.  Cervical scars are noted.   CT SCAN:  He had a CT angio of the chest to look for pulmonary embolism.  There was no pulmonary embolism.  He had thoracic atherosclerosis as well as  coronary artery atherosclerosis as well.  Fluids filled the esophagus.  Mild   peripancreatic edema with dilated pancreatic duct up to 13 mm and a partial  calcified area of soft tissue fullness in the head and uncinate process.  Ill-defined fat plane between the splenoportal complex confluent to the  pancreatic soft tissue area as well.  Probable simple cyst in the right  kidney upper pole.  Punctate right inner pole and renal calculus, left renal  sinus cyst.  Renal artery atherosclerosis and abdominal aortic  atherosclerotic irregularity with  stable ectasia.  Chronic stable retroperitoneal adenopathy and chronic soft  tissue fullness of the small bowel near prior site of cholecystectomy.   I appreciate the opportunity to care for this patient.      Iva Boop, M.D. [facility]  Electronically Signed     CEG/MEDQ  D:  11/15/2005  T:  11/16/2005  Job:  [id number]   cc:   [name]. [name], M.D. [facility]  [address] [place]  [place] [address]   Venita Lick. Russella Dar, M.D. [facility]  [address]  [place]  [place] [address]

## 2010-09-17 NOTE — H&P (Signed)
[name], [name]               ACCOUNT NO.:  [id number]   MEDICAL RECORD NO.:  [id number]          PATIENT TYPE:  EMS   LOCATION:  MAJO                         FACILITY:  [facility]   PHYSICIAN:  Sean A. Everardo All, M.D. [facility] OF BIRTH:  11/19/1940   DATE OF ADMISSION:  08/11/2004  DATE OF DISCHARGE:                                HISTORY & PHYSICAL   REASON FOR ADMISSION:  Abdominal pain.   HISTORY OF PRESENT ILLNESS:  The patient is a 70 year old man with 6 hours  of severe pain at the epigastric area and has associated pain at the left  arm and nausea.   PAST MEDICAL HISTORY:  1.  He quit drinking alcohol in 1995 after repeated bouts of pancreatitis.  2.  He states he had another bout of pancreatitis in 1997 which he states      was determined to be cured with a cholecystectomy.  It cannot be      determined with certainty if a gallstone was the cause of the      pancreatitis.  He states he has had intermittent epigastric pain ever      since.  3.  Dyslipidemia.  4.  PAD.   MEDICATIONS:  1.  Lipitor 20 mg a day.  2.  Aspirin 81 mg a day.  3.  DynaCirc 5 mg a day.  4.  Pagestyme two tablets t.i.d. (q.a.c.)   SOCIAL HISTORY:  He is married and retired.   FAMILY HISTORY:  Negative for pancreatitis.   REVIEW OF SYSTEMS:  Denies the following:  Shortness of breath, vomiting,  fever, loss of consciousness, diarrhea, skin rash, weight loss, rectal  bleeding, hematuria, seizure, headache, visual loss, and dysuria.   PHYSICAL EXAMINATION:  VITAL SIGNS:  Blood pressure 138/88, heart rate is  62, respiratory rate is 18, the patient is afebrile.  GENERAL:  No distress.  SKIN:  Not diaphoretic.  HEENT:  No scleral icterus.  Pharynx no erythema.  NECK:  Supple.  CHEST:  Clear to auscultation.  HEART:  No JVD, no edema.  Regular rate and rhythm, no murmur.  Pedal pulses  are intact.  ABDOMEN:  Soft.  There is moderate epigastric tenderness.  Bowel sounds are  decreased from  normal, although, they are present.  GENITOURINARY:  RECTAL:  Not done at this time due to the patient's  condition.  EXTREMITIES:  No deformity is seen.  NEUROLOGY:  Alert, well-oriented.  Cranial nerves appear to be intact and he  readily moves all fours.   LABORATORY DATA:  Amylase 441, WBC 15,500, hepatic transaminases are normal.  Lipase is 804.   IMPRESSION:  1.  Pancreatitis.  2.  Elevated WBC probably due to #1.  3.  Mild hyponatremia due to #1.  4.  Other chronic medical problems as noted above.   PLAN:  1.  Symptomatic therapy.  2.  CPK's.  3.  Intravenous fluid.  4.  Check abdominal ultrasound.  5.  Check lipids in the morning.  6.  I discussed code status with the patient and he requests DNR.  SAE/MEDQ  D:  08/11/2004  T:  08/12/2004  Job:  [id number]   cc:   [name]. [name], M.D. [facility]

## 2010-09-17 NOTE — H&P (Signed)
[name], [name]               ACCOUNT NO.:  [id number]   MEDICAL RECORD NO.:  [id number]          PATIENT TYPE:  INP   LOCATION:  1825                         FACILITY:  [facility]   PHYSICIAN:  Titus Dubin. Alwyn Ren, M.D. 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   He has had chronic pancreatitis for at least 11-12 years in the context of  prior alcohol abuse and extensive intra-abdominal surgeries after  cholecystectomy.   Following cholecystectomy, he had 3 minor and 3 major surgeries for  infection over 6 months in 1997.  He is a recovering alcoholic, but has had  no alcohol for 11 years.  He has not been attending AA for at least 5 years.   He was seen in the emergency room on November 11, 2005 and placed on Vicodin 1-2  every 4 hours as needed.  He has taken at least 6 over the last 24 hours,  but has had progression of his pain.  Today, at approximately 11 a.m., he  had severe pain up to 9 on a 10 scale which persisted for a 3-hour period.  There was also some associated chest tightness, as the pain radiated  superiorly from the epigastrium.  Because of the progression of pain, he  returned to the emergency room and requested admission.   In the emergency room, cardiac enzymes were negative.  D-dimer was mildly  elevated at 0.76.  He has had no shortness of breath or clinical findings to  suggest pulmonary emboli.   MEDICAL HISTORY:  1.  Knee surgery on the left in 1962.  2.  He has had bilateral cataract surgery.   ALLERGIES:  No known drug allergies.   SOCIAL HISTORY:  He does not smoke.   DIET:  Consists of sherbet, low-fat ice cream, soup and sandwiches for the  most part per his history.   FAMILY HISTORY:  Negative for pancreatitis, diabetes  or cancer.  Both  parents died of myocardial infarctions; his father at 87 and mother at 37.   MEDICATIONS:  1.  DynaCirc 5 mg daily for blood pressure.  2.  Crestor ? 10 mg daily for dyslipidemia.  3.  A stool softener that he takes when on Vicodin.    He attempts to control the pain usually by resting.   He states that he has chronic pain, usually on a scale of 2-3 on a 10 scale  each day, which he will not treat with Vicodin.  He describes his pain now  as a 6 or 7.   He last saw Dr. Russella Dar in October of 2006 and had an MRI.   REVIEW OF SYSTEMS:  Otherwise, the review of systems is negative.  He  specifically denies dysphagia or rectal bleeding.  He has chronic pain in  the left knee.  It is interesting that he played football for [employer]  and  ran track for [employer].  He denies any genitourinary symptoms.  He has had  no neuropsychiatric symptoms.   PHYSICAL EXAMINATION:  GENERAL:  He appears in no acute distress.  Blood  pressure is 155/89, pulse 82 and respiratory rate 20.  HEENT:  Pupils are pinpoint, and the fundi cannot be visualized.  He has no  jaundice or scleral icterus.  Dental hygiene is excellent with multiple  implants.  Tympanic membranes are clear with no evidence of pathology.  NECK:  He had no lymphadenopathy in the head, neck or axilla.  Thyroid is  normal to palpation.  CHEST:  Clear to auscultation.  He exhibits no splinting.CARDIAC:  He has an  S4 with no murmurs or gallops.  ABDOMEN:  Bowel sounds are active.  He has tenderness in the epigastrium.  EXTREMITIES:  There is crepitus in the knees, dramatically so on the left at  the sight of the previous operative scar.  Pulses are intact.  There is no  edema. Homans sign is negative.  There are no neuropsychiatric deficits,  although His speech is slurred.  He is oriented to time, place and person.   LABORATORY DATA:  White count was 10,400 with 87% neutrophils.  Lipase was  96.  The remainder of the  CBC and differential, cardiac enzymes, CMET are  normal.   He is now admitted for recurrent pancreatitis with chest pain with negative  enzymes.  He also has hypertension and dyslipidemia.   He will be observed and treated with IV pain medications.  GI will be  consulted if he fails to stabilize.Perhaps he is a candidate for celiac  plexus blockade to control his chronic , daily pain.      Titus Dubin. Alwyn Ren, M.D. [facility]  Electronically Signed     WFH/MEDQ  D:  11/12/2005  T:  11/13/2005  Job:  [id number]   cc:   [name]. [name], M.D. [facility]  [address] [place]  [place] [address]   Venita Lick. Russella Dar, M.D. [facility]  [address]  [place]  [place] [address]

## 2010-09-17 NOTE — Discharge Summary (Signed)
[name], [name]               ACCOUNT NO.:  [id number]   MEDICAL RECORD NO.:  [id number]          PATIENT TYPE:  INP   LOCATION:  3008                         FACILITY:  [facility]   PHYSICIAN:  Gordy Savers, M.D. LHCDATE OF BIRTH:  Mar 14, 1941   DATE OF ADMISSION:  08/11/2004  DATE OF DISCHARGE:  08/15/2004                                 DISCHARGE SUMMARY   FINAL DIAGNOSIS:  Recurrent pancreatitis.   PROCEDURES:  Abdominal CT scan with contrast, CT of the pelvis with  contrast, abdominal ultrasound.   DISCHARGE MEDICATIONS:  1.  Vicodin, 1-2 p.o. q.4-6h. p.r.n. pain.  2.  DynaCirc 5 mg daily.  3.  Lipitor 20 mg daily.  4.  Aspirin 81 mg daily.  5.  Pangestyme 2 tablets before meals t.i.d.   HISTORY OF PRESENT ILLNESS:  The patient is a 70 year old gentleman who  presented to the emergency room with a 6 hours' history of severe pain in  the epigastric area.  This has been associated with nausea.   PAST MEDICAL HISTORY:  Pertinent in that he had a history of prior alcohol  use which was discontinued in 1995 after recurrent pancreatitis.  Remote  cholecystectomy in 1997.  History of peripheral vascular occlusive disease  as well as dyslipidemia.   LABORATORY DATA/HOSPITAL COURSE:  On admission, the patient was quite tender  in the epigastric area. Bowel sounds were diminished.  Evaluation included a  complete abdominal ultrasound that showed that the pancreas was obscured by  bowel gas. There was no retroperitoneal fluid.  There were findings  consistent with prior cholecystectomy.  CT scan was obtained that revealed  evidence of mild pancreatitis without necrosis.  Also, there was also air  noted in the biliary tree.  CT of the pelvis did show complete occlusion of  the external iliac artery at its origin on the left.   Laboratory studies revealed an elevated white count of 15.2 on admission,  amylase 441, lipase 804; both improved dramatically within 48 hours.   His  hospital course was uneventful with steady improvement of his pain. His  diet was eventually advanced from n.p.o. to clear liquids, to full liquid  diet which he tolerated well. At the time of discharge, he was pain-free.   DISPOSITION:  The patient was discharged with office follow-up in the next  week. He will be discharged on his usual preadmission medications.  A  prescription for Vicodin also dispensed.    PFK/MEDQ  D:  08/15/2004  T:  08/15/2004  Job:  [id number]

## 2010-09-17 NOTE — Discharge Summary (Signed)
[name], [name]               ACCOUNT NO.:  [id number]   MEDICAL RECORD NO.:  [id number]          PATIENT TYPE:  INP   LOCATION:  4729                         FACILITY:  [facility]   PHYSICIAN:  Titus Dubin. Alwyn Ren, M.D. [facility] OF BIRTH:  07-Mar-1941   DATE OF ADMISSION:  11/12/2005  DATE OF DISCHARGE:  11/13/2005                                 DISCHARGE SUMMARY   ADMITTING DIAGNOSES:  1. Acute on chronic pancreatitis.  2. Hypertension.  3. Dyslipidemia.   DISCHARGE DIAGNOSES:  1. Acute on chronic pancreatitis.  2. Hypertension.  3. Dyslipidemia.   BRIEF HISTORY:  [name] has had pancreatitis for 11-12 years with chronic  daily pain which exacerbates to high levels at times prompting  hospitalization.  He typically will not take any medication but does have  p.r.n. Vicodin for increased severity of pain.   Because of the pain up to 8 level despite 6 Vicodin in 24 hours he was  admitted for observation.   White count was 10,400 with 87% neutrophils, lipase was mildly elevated to  96.  The remainder of the labs were essentially normal.   Additionally, with the pancreatitis exacerbation, he experienced some chest  tightness.  Cardiac enzymes were negative.  D-dimer was mildly elevated but  had no clinical signs of PTE.   On November 13, 2005, he was up walking the halls and stated he was ready to go  home.  He denies any chest pain or shortness of breath.  His chief symptoms  was one of frustration over the chronicity of his problem.   He was afebrile with a pulse of 56, respiratory rate 16, blood pressure was  well controlled 125/80.  O2 sats were 94% on room air.  Bowel sounds were  active and abdomen was nontender.  Repeat hematocrit was 36.3 and platelet  count was 81,000.  Lipase had dropped to 56.  Amylase was normal at 39.   Duragesic (fentanyl) 12.5 mg patch was recommended every 3 days to control  the low grade pain.  It was recommended he consult with  his primary  care  physician concerning referral to Dr.Jacobs, Gastroenterologist for possible  celiac plexus ganglion blockade via injection.  If he is not a candidate for  this then referral can be made to a chronic pain clinic.   DISCHARGE STATUS:  Improved.   DIET:  Will be as tolerated.   PROGNOSIS:  depends upon response to options above      Titus Dubin. Alwyn Ren, M.D. [facility]  Electronically Signed     WFH/MEDQ  D:  11/13/2005  T:  11/13/2005  Job:  [id number]   cc:   Venita Lick. Russella Dar, M.D. [facility]

## 2010-09-17 NOTE — Discharge Summary (Signed)
Zachary [name], [name]               ACCOUNT NO.:  [id number]   MEDICAL RECORD NO.:  [id number]          PATIENT TYPE:  INP   LOCATION:  5727                         FACILITY:  [facility]   PHYSICIAN:  Rene Paci, M.D. LHCDATE OF BIRTH:  1940/12/03   DATE OF ADMISSION:  02/05/2005  DATE OF DISCHARGE:  02/10/2005                                 DISCHARGE SUMMARY   DISCHARGE DIAGNOSIS:  Recurrent idiopathic pancreatitis.   HISTORY OF PRESENT ILLNESS:  The patient is a 70 year old white male with a  history of pancreatitis who noted the onset of abdominal and epigastric pain  with nausea on the morning of admission.  The patient took Vicodin without  any relief and decided to come to the emergency room.  The patient was  admitted for further evaluation and treatment.   PAST MEDICAL HISTORY:  1.  Remote history of alcohol abuse, quit in 1995 secondary to pancreatitis.  2.  Status post cholecystectomy 1997.  3.  Dyslipidemia.  4.  PAD.   HOSPITAL COURSE:  Problem 1. Recurrent pancreatitis, acute and chronic.  The  patient was admitted and was noted to have an elevated lipase of 888 as well  as an elevated amylase of 447.  A GI consult was obtained and the patient  was seen by Dr. Russella Dar.  The patient underwent a CT of the abdomen and pelvis  which noted extensive peripancreatic inflammation.  The patient was started  on Rocephin IV which was later discontinued prior to discharge by the GI  team.  The patient was continued on antiemetics and p.r.n. pain medications.  The patient's diet was advanced.  The patient is currently tolerating full  p.o.  At this time the GI plan is for the patient to receive an outpatient  MRCP with Dr. Russella Dar.  Problem 2. Hypokalemia.  The patient was noted to be hypokalemic during this  admission.  Potassium was 3.0 and was repleated.   DISCHARGE MEDICATIONS:  1.  Lipitor 20 mg p.o. daily.  2.  Aspirin 81 mg p.o. daily.  Currently on hold per GI.  3.   DynaCirc 5 mg p.o. daily.  4.  Pangestyme two tabs p.o. three times daily.  5.  Multivitamins one tab p.o. daily.  6.  Vitamin C 500 mg p.o. daily.  7.  Vicodin 500/5 mg 1-2 tabs p.o. q.6h. p.r.n. pain.  Prescription provided      for 20 tablets.   LABORATORY DATA AT DISCHARGE:  Hemoglobin 11.5, hematocrit 33.6, white blood  cell count 5.9, platelets 126, BUN 13, creatinine 0.9.   FOLLOW UP:  The patient is to followup with Dr. Russella Dar on Wednesday, February 23, 2005 at 2:15 p.m.  In addition the patient is to followup with Dr. [name] in 1-2 weeks and the patient will call for an appointment.      Melissa S. Peggyann Juba, NP      Rene Paci, M.D. [facility]  Electronically Signed    MSO/MEDQ  D:  02/10/2005  T:  02/10/2005  Job:  [id number]   cc:   Venita Lick. Russella Dar, M.D. [facility]  [address]  [place]  [place] [address]   [name]. [name], M.D. [facility]  [address] [place]  [place] [address]

## 2010-09-17 NOTE — Discharge Summary (Signed)
[name], [name]               ACCOUNT NO.:  [id number]   MEDICAL RECORD NO.:  [id number]          PATIENT TYPE:  INP   LOCATION:  5731                         FACILITY:  [facility]   PHYSICIAN:  Thomos Lemons, D.O. [facility]   DATE OF BIRTH:  [birth year]   DATE OF ADMISSION:  11/15/2005  DATE OF DISCHARGE:  11/19/2005                                 DISCHARGE SUMMARY   DISCHARGE DIAGNOSES:  1.  Chronic abdominal pain, presumed secondary to chronic pancreatitis.  2.  Abnormal pancreatic head by computerized tomography scan.  3.  Hypertension, controlled.  4.  Left eye conjunctivitis.   MEDICATIONS ON DISCHARGE:  1.  Aspirin 81 mg once per day, on hold.  2.  Lipitor 10 mg at bedtime, on hold.  3.  DynaCirc 5 mg once daily;  4.  Polymyxin b / trimethoprim eyedrops one drop to the left eye 4 times      daily.   FOLLOW-UP:  He was instructed to follow up with Dr. Cato Mulligan in the office one  week following discharge.  He is to call Dr. Cato Mulligan' office for an  appointment.  He is instructed to also follow up with Dr. Russella Dar, his  gastroenterologist, and he is to also call his office for an appointment.   HOSPITAL COURSE:  The patient is a 70 year old white male with several  recent admissions for abdominal pain.  The patient has a history of chronic  pancreatitis.  A CT scan of his abdomen and pelvis was ordered.  The patient  was noted to have mild peri pancreatic edema with a dilated pancreatic duct  up to 13 mm and a partially calcified area of soft tissue fullness in the  head and uncinate process.  He was seen by Dr. Leone Payor of [facility] who  recommended performing endoscopic ultrasound with biopsy.  This was  performed on 11/17/2005.  The patient was noted to have chronic pancreatic  changes.  Fine needle aspiration taken from an abnormal head of pancreas  randomly as no discrete mass was seen.   The patient was treated with oxycodone 5 mg every 4-6 hours.  The patient's  symptoms were  controlled.  He was eating solids prior to discharge.   LABORATORY INVESTIGATIONS:  CBC performed on 11/15/2005:  WBC 7.6, H&H of  12.9 and 38.3, platelet count of 161,000.  The patient's liver enzymes were  unremarkable and, on 11/15/2005, amylase was 27, lipase noted to be 34.  A CA-  19-9 was ordered and it was elevated at 71.1.   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  The patient underwent biopsy; results need to be followed up  with Dr. Russella Dar and Dr. Cato Mulligan.  The patient was clinically stable for  discharge with oxycodone 5 mg.  Apparently he previously had a prescription  for a Duragesic patch which fell off in the shower.  The patient may be  better managed with OxyContin ER with twice daily oral dosing.      Molly Maduro  [facility]  Electronically Signed     RY/MEDQ  D:  11/19/2005  T:  11/19/2005  Job:  [id number]   cc:   [name]. [name], M.D. [facility]

## 2010-10-13 ENCOUNTER — Other Ambulatory Visit: Payer: Self-pay | Admitting: Internal Medicine

## 2010-10-13 DIAGNOSIS — R52 Pain, unspecified: Secondary | ICD-10-CM

## 2010-10-13 NOTE — Telephone Encounter (Signed)
Pt needs new rx percocet 10-325mg 

## 2010-10-14 NOTE — Telephone Encounter (Signed)
ok 

## 2010-10-14 NOTE — Telephone Encounter (Signed)
Last refill was 09/16/10

## 2010-10-15 MED ORDER — OXYCODONE-ACETAMINOPHEN 10-325 MG PO TABS: 1.0000 | ORAL_TABLET | Freq: Two times a day (BID) | ORAL | Status: DC

## 2010-10-15 NOTE — Telephone Encounter (Signed)
rx up front ready for p/u, pt aware 

## 2010-10-25 ENCOUNTER — Other Ambulatory Visit: Payer: Self-pay | Admitting: Internal Medicine

## 2010-11-08 ENCOUNTER — Telehealth: Payer: Self-pay | Admitting: Internal Medicine

## 2010-11-08 DIAGNOSIS — R52 Pain, unspecified: Secondary | ICD-10-CM

## 2010-11-08 NOTE — Telephone Encounter (Signed)
Refill Isradidine 5 mg #180 to [facility]. Needs on Wednesday his percocet. Can pickup on Friday. Going out of town.

## 2010-11-10 ENCOUNTER — Other Ambulatory Visit: Payer: Self-pay | Admitting: *Deleted

## 2010-11-10 NOTE — Telephone Encounter (Signed)
Print scripts and dr Lovell Sheehan will sign-the first med is dynacirc that can be sent via epic

## 2010-11-11 MED ORDER — ISRADIPINE 10 MG PO TB24: 10.0000 mg | ORAL_TABLET | Freq: Every day | ORAL | Status: DC

## 2010-11-11 MED ORDER — OXYCODONE-ACETAMINOPHEN 10-325 MG PO TABS: 1.0000 | ORAL_TABLET | Freq: Two times a day (BID) | ORAL | Status: DC

## 2010-11-11 NOTE — Telephone Encounter (Signed)
rx up front, ready for p/u, pt aware 

## 2010-11-17 ENCOUNTER — Telehealth: Payer: Self-pay

## 2010-11-17 MED ORDER — AMLODIPINE BESYLATE 5 MG PO TABS: 5.0000 mg | ORAL_TABLET | Freq: Every day | ORAL | Status: DC

## 2010-11-17 NOTE — Telephone Encounter (Signed)
Pharmacy called and says she spoke with [facility] yesterday about dynacirc. Apparently not only are the 10 mg on backorder but the 5 mg are as well.  Ref #[id number] and callback # [phone]  Please advise what rx can be called in.

## 2010-11-17 NOTE — Telephone Encounter (Signed)
Per dr Lovell Sheehan- m ay have amlodipine 5 mg qd

## 2010-11-17 NOTE — Telephone Encounter (Signed)
Rx sent to [facility]

## 2010-11-22 ENCOUNTER — Other Ambulatory Visit: Payer: Self-pay | Admitting: Internal Medicine

## 2010-12-13 ENCOUNTER — Telehealth: Payer: Self-pay | Admitting: Internal Medicine

## 2010-12-13 DIAGNOSIS — R52 Pain, unspecified: Secondary | ICD-10-CM

## 2010-12-13 MED ORDER — OXYCODONE-ACETAMINOPHEN 10-325 MG PO TABS: 1.0000 | ORAL_TABLET | Freq: Two times a day (BID) | ORAL | Status: DC

## 2010-12-13 NOTE — Telephone Encounter (Signed)
Refill    Percocet

## 2010-12-13 NOTE — Telephone Encounter (Signed)
rx up front ready for p/u, pt aware 

## 2011-01-10 ENCOUNTER — Other Ambulatory Visit: Payer: Self-pay | Admitting: Internal Medicine

## 2011-01-10 DIAGNOSIS — R52 Pain, unspecified: Secondary | ICD-10-CM

## 2011-01-10 NOTE — Telephone Encounter (Signed)
Pt requesting refill on oxyCODONE-acetaminophen (PERCOCET) 10-325 MG per tablet

## 2011-01-12 NOTE — Telephone Encounter (Addendum)
Dr Cato Mulligan is out of the office, Dr Lovell Sheehan verbally agreed to do rx.  Rx up front ready for p/u, pt aware

## 2011-01-12 NOTE — Telephone Encounter (Signed)
Pt called back to check on status of getting refill of Oxycodone(Percocet)10-325 mg. Pls call when ready for pick up. Pt aware that pcp is out of office.

## 2011-01-13 MED ORDER — OXYCODONE-ACETAMINOPHEN 10-325 MG PO TABS: 1.0000 | ORAL_TABLET | Freq: Two times a day (BID) | ORAL | Status: DC

## 2011-01-13 NOTE — Telephone Encounter (Signed)
Addended by: Alfred Levins D on: 01/13/2011 10:56 AM   Modules accepted: Orders

## 2011-01-26 ENCOUNTER — Telehealth: Payer: Self-pay | Admitting: Internal Medicine

## 2011-01-26 NOTE — Telephone Encounter (Signed)
Had some hematuria on Monday. Not as bad today. Wants referral to Urologist or see Dr Cato Mulligan. Wants Arline Asp to return his call.

## 2011-01-27 NOTE — Telephone Encounter (Signed)
Referral order placed.

## 2011-02-09 ENCOUNTER — Telehealth: Payer: Self-pay | Admitting: Internal Medicine

## 2011-02-09 DIAGNOSIS — R52 Pain, unspecified: Secondary | ICD-10-CM

## 2011-02-09 MED ORDER — OXYCODONE-ACETAMINOPHEN 10-325 MG PO TABS: 1.0000 | ORAL_TABLET | Freq: Two times a day (BID) | ORAL | Status: DC

## 2011-02-09 NOTE — Telephone Encounter (Signed)
ok 

## 2011-02-09 NOTE — Telephone Encounter (Signed)
This is 3 days early

## 2011-02-09 NOTE — Telephone Encounter (Signed)
Pt requesting refill on oxyCODONE-acetaminophen  Please contact when ready to pick up

## 2011-02-09 NOTE — Telephone Encounter (Signed)
rx up front ready for p/u, pt aware 

## 2011-02-16 ENCOUNTER — Other Ambulatory Visit: Payer: Self-pay | Admitting: Internal Medicine

## 2011-02-22 ENCOUNTER — Other Ambulatory Visit: Payer: Self-pay | Admitting: *Deleted

## 2011-02-22 MED ORDER — PANCRELIPASE (LIP-PROT-AMYL) 12000-38000 UNITS PO CPEP: 2.0000 | ORAL_CAPSULE | Freq: Three times a day (TID) | ORAL | Status: DC

## 2011-03-03 ENCOUNTER — Ambulatory Visit (INDEPENDENT_AMBULATORY_CARE_PROVIDER_SITE_OTHER): Payer: Medicare Other | Admitting: Internal Medicine

## 2011-03-03 ENCOUNTER — Encounter: Payer: Self-pay | Admitting: Internal Medicine

## 2011-03-03 VITALS — BP 134/82 | HR 76 | Temp 97.7°F | Wt 198.0 lb

## 2011-03-03 DIAGNOSIS — I714 Abdominal aortic aneurysm, without rupture, unspecified: Secondary | ICD-10-CM

## 2011-03-03 DIAGNOSIS — K859 Acute pancreatitis without necrosis or infection, unspecified: Secondary | ICD-10-CM

## 2011-03-03 DIAGNOSIS — Z23 Encounter for immunization: Secondary | ICD-10-CM

## 2011-03-03 DIAGNOSIS — I10 Essential (primary) hypertension: Secondary | ICD-10-CM

## 2011-03-03 NOTE — Assessment & Plan Note (Signed)
CT scan 4.6 cm on 03/02/11 (discussed with dr. Gery Pray) I'll set up with [facility] so that he can be followed appropriately

## 2011-03-03 NOTE — Progress Notes (Signed)
  Subjective:    Patient ID: [name], male    DOB: 1941-02-24, 70 y.o.   MRN: [id number]  HPI  Pancreatitis---able to medicate and control with hydration and small meals  Hematuria-seeing dr. Isabel Caprice  HTN---tolerating meds   Past Medical History  Diagnosis Date  . Hx of colonic polyps   . Diverticulosis   . Hyperlipidemia   . AAA (abdominal aortic aneurysm) last ct 03-02-11    4.6cm  per note from dr fields w/ chart  . Substance abuse hx alcohol abuse  . Alcohol-induced chronic pancreatitis   . Hypertension stress test 1996- states wnl  . Peripheral vascular disease     occlusion left external iliac artery   . Gross hematuria     freq/ urge/ nocturia  . Arthritis     knees   Past Surgical History  Procedure Date  . Knee surgery 1961    left  . Wrist fracture surgery 2010    orif left wrist  . Cataract extraction w/ intraocular lens  implant, bilateral   . Cholecystectomy 1997- laparoscopic    complications of infection, w/ additional surg's 3 minor and 3 major  . Hernia repair 2010    Left inguinal herniorraphy with mesh    reports that he quit smoking about 8 years ago. He has never used smokeless tobacco. He reports that he does not drink alcohol or use illicit drugs. family history includes Heart attack in his father and Heart disease in his mother. No Known Allergies  Review of Systems  patient denies chest pain, shortness of breath, orthopnea. Denies lower extremity edema, abdominal pain, change in appetite, change in bowel movements. Patient denies rashes, musculoskeletal complaints. No other specific complaints in a complete review of systems.      Objective:   Physical Exam  well-developed well-nourished male in no acute distress. HEENT exam atraumatic, normocephalic, neck supple without jugular venous distention. Chest clear to auscultation cardiac exam S1-S2 are regular. Abdominal exam overweight with bowel sounds, soft and nontender. Extremities no  edema. Neurologic exam is alert with a normal gait.    Assessment & Plan:

## 2011-03-09 ENCOUNTER — Encounter: Payer: Self-pay | Admitting: Vascular Surgery

## 2011-03-09 ENCOUNTER — Other Ambulatory Visit: Payer: Self-pay | Admitting: Internal Medicine

## 2011-03-09 DIAGNOSIS — R52 Pain, unspecified: Secondary | ICD-10-CM

## 2011-03-09 NOTE — Telephone Encounter (Signed)
Pt called req refill of oxyCODONE-acetaminophen (PERCOCET) 10-325 MG per tablet  ° °

## 2011-03-10 ENCOUNTER — Ambulatory Visit (INDEPENDENT_AMBULATORY_CARE_PROVIDER_SITE_OTHER): Payer: Medicare Other | Admitting: Vascular Surgery

## 2011-03-10 ENCOUNTER — Encounter: Payer: Self-pay | Admitting: Vascular Surgery

## 2011-03-10 ENCOUNTER — Other Ambulatory Visit: Payer: Self-pay | Admitting: Urology

## 2011-03-10 VITALS — BP 129/79 | HR 53 | Resp 14 | Ht 72.5 in | Wt 197.0 lb

## 2011-03-10 DIAGNOSIS — I714 Abdominal aortic aneurysm, without rupture, unspecified: Secondary | ICD-10-CM

## 2011-03-10 NOTE — Telephone Encounter (Signed)
Pt called back again regarding refill status. Explained to him that we do have 24-72hrs. He was not upset, & he realizes Dr. Cato Mulligan is out of town. Please call him back with status. Thanks.

## 2011-03-10 NOTE — Progress Notes (Signed)
[facility] HISTORY AND PHYSICAL   History of Present Illness:  Patient is a 70 y.o. year old male who presents for evaluation of abdominal aortic aneurysm.  The aneurysm is currently 4.6 cm in diameter by CT performed at [facility] on 03/02/11.  The patient denies abdominal pain.  The patient denies back pain.  The patient hadenies family history of AAA.  Other medical problems include hypertension, hyperlipid, chronic ;pancreatitis, recent urine cytology malignant cells.  Past Medical History  Diagnosis Date  . Hx of colonic polyps   . Diverticulosis   . Hypertension   . Peripheral vascular disease   . Pancreatitis, alcoholic   . Hyperlipidemia   . AAA (abdominal aortic aneurysm)   . Substance abuse     Past Surgical History  Procedure Date  . Knee surgery 1961    left  . Cholecystectomy 1992    complications of infection, hopitalized 5 months  . Stent legs   . Wrist fracture surgery   . Cataract extraction   . Hernia repair 2010    Left inguinal herniorraphy with mesh     Social History History  Substance Use Topics  . Smoking status: Former Games developer  . Smokeless tobacco: Not on file  . Alcohol Use: No     not now  No Alcohol for 17 years  Family History Family History  Problem Relation Age of Onset  . Heart disease Mother   . Heart attack Father     Allergies  No Known Allergies   Current Outpatient Prescriptions  Medication Sig Dispense Refill  . amLODipine (NORVASC) 5 MG tablet Take 1 tablet (5 mg total) by mouth daily.  90 tablet  3  . Ascorbic Acid (VITAMIN C) 500 MG tablet Take 500 mg by mouth daily.        Marland Kitchen aspirin 81 MG tablet Take 81 mg by mouth daily.        . diphenhydrAMINE (BENADRYL) 25 mg capsule Take 25 mg by mouth every 6 (six) hours as needed.        Tery Sanfilippo Sodium (PHILLIPS STOOL SOFTENER PO) Take by mouth.        . fish oil-omega-3 fatty acids 1000 MG capsule Take 2 g by mouth daily.        Marland Kitchen  HYDROcodone-acetaminophen (LORTAB) 7.5-500 MG per tablet TAKE ONE TABLET BY MOUTH EVERY 4 HOURS IF NEEDED FOR PAIN  100 tablet  1  . lipase/protease/amylase (CREON) 12000 UNITS CPEP Take 2 capsules by mouth 3 (three) times daily before meals.  270 capsule  3  . lisinopril (PRINIVIL,ZESTRIL) 20 MG tablet 1 TABLET BY MOUTH DAILY  90 tablet  2  . oxyCODONE-acetaminophen (PERCOCET) 10-325 MG per tablet Take 1 tablet by mouth 2 (two) times daily.  60 tablet  0  . rosuvastatin (CRESTOR) 10 MG tablet Take 10 mg by mouth daily.        . isradipine (DYNACIRC CR) 10 MG 24 hr tablet Take 1 tablet (10 mg total) by mouth daily.  90 tablet  3    ROS:   General:  No weight loss, Fever, chills  HEENT: No recent headaches, no nasal bleeding, no visual changes, no sore throat  Neurologic: No dizziness, blackouts, seizures. No recent symptoms of stroke or mini- stroke. No recent episodes of slurred speech, or temporary blindness.  Cardiac: No recent episodes of chest pain/pressure, no shortness of breath at rest.  No shortness of breath with exertion.  Denies  history of atrial fibrillation or irregular heartbeat  Vascular: No history of rest pain in feet.  Has history of claudication and PAD with known iliac occlusion.  He is ok with current walking distance  No history of non-healing ulcer, No history of DVT   Pulmonary: No home oxygen, no productive cough, no hemoptysis,  No asthma or wheezing  Musculoskeletal:  [x ] Arthritis, [ ]  Low back pain,  [ ]  Joint pain  Hematologic:No history of hypercoagulable state.  No history of easy bleeding.  No history of anemia  Gastrointestinal: No hematochezia or melena,  No gastroesophageal reflux, no trouble swallowing  Urinary: [ ]  chronic Kidney disease, [ ]  on HD - [ ]  MWF or [ ]  TTHS, [ ]  Burning with urination, [ ]  Frequent urination, [ ]  Difficulty urinating; recent gross hematuria under evaluation  Skin: No rashes  Psychological: No history of anxiety,  No  history of depression   Physical Examination  Filed Vitals:   03/10/11 1137  BP: 129/79  Pulse: 53  Resp: 14  Height: 6' 0.5" (1.842 m)  Weight: 197 lb (89.359 kg)  SpO2: 94%    Body mass index is 26.35 kg/(m^2).  General:  Alert and oriented, no acute distress HEENT: Normal Neck: No bruit or JVD Pulmonary: Clear to auscultation bilaterally Cardiac: Regular Rate and Rhythm without murmur Gastrointestinal: Soft, non-tender, non-distended, no mass, no scars Skin: No rash Extremity Pulses:  2+ radial, brachial, absent left femoral, 1+ right femoral Musculoskeletal: No deformity or edema  Neurologic: Upper and lower extremity motor 5/5 and symmetric  DATA:  CT scan of abdomen and pelvis dated 03/02/2011 images are reviewed today. 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 There is also diffuse calcification of the right iliac system. There is a 4.6 cm abdominal aortic aneurysm without evidence of rupture.  ASSESSMENT: 4.6 cm abdominal aortic aneurysm, asymptomatic   PLAN: The patient will be scheduled for a repeat abdominal aortic ultrasound in April of 2013 as he is planning to spend the winter in [place]. He will let in the emergency room no that he has no abdominal aortic aneurysm if he experiences severe abdominal or back pain. Patient was reassured that her risk of rupture 44.6 MA her abdominal aortic aneurysm is less than 1% per year.   Fabienne Bruns, MD [facility] Office: [phone] Pager: [phone]

## 2011-03-11 ENCOUNTER — Encounter (HOSPITAL_BASED_OUTPATIENT_CLINIC_OR_DEPARTMENT_OTHER): Payer: Self-pay | Admitting: *Deleted

## 2011-03-11 MED ORDER — OXYCODONE-ACETAMINOPHEN 10-325 MG PO TABS: 1.0000 | ORAL_TABLET | Freq: Two times a day (BID) | ORAL | Status: DC

## 2011-03-11 NOTE — Progress Notes (Signed)
NPO after MN. Pt to arrive at 1045. Needs istat and ekg. Make take pain med if needed w/ sip of water.

## 2011-03-11 NOTE — H&P (Signed)
History of Present Illness   [name] (patient prefers to go by [name]) presents today because of several episodes of painless gross hematuria. [name] is 70 years of age. He really does not have any pertinent urologic history. The patient noticed an episode of painless total gross hematuria approximately a month ago after playing golf. He held his aspirin and it seemed to resolve. He restarted the aspirin and had several additional episodes of gross hematuria. He has had none in the last couple of weeks despite restarting his aspirin. With this, he really had no other systemic complaints. He had no change in voiding patterns and really this was asymptomatic gross hematuria. No prior history of nephrolithiasis. The patient does have a previous fairly significant tobacco use history of approximately 75-100 pack years. He does have at least some voiding symptoms as a baseline. He does have some urinary frequency with occasionally a weaker urinary stream.       Past Medical History Problems  1. History of  Arthritis V13.4 2. 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 History of  Hyperlipidemia 272.4 4. History of  Hypertension 401.9 5. History of  Sleep Disturbances 780.50  Surgical History Problems  1. History of  Gallbladder Surgery 2. History of  Hernia Repair 3. History of  Knee Surgery 4. History of  Wrist Surgery  Current Meds 1. AmLODIPine Besylate 5 MG Oral Tablet; Therapy: (Recorded:26Oct2012) to 2. Aspirin 81 MG Oral Tablet; 1 per day; Therapy: (Recorded:26Oct2012) to 3. Benadryl 25 MG Oral Tablet; 1 tablet every 6 hours as needed; Therapy: (Recorded:26Oct2012) to 4. Creon 12000 UNIT Oral Capsule Delayed Release Particles; Therapy: (Recorded:26Oct2012) to 5. Crestor 10 MG Oral Tablet; Therapy: (Recorded:26Oct2012) to 6. Docusate Sodium 100 MG Oral Capsule; TAKE 1 CAPSULE DAILY; Therapy:  (Recorded:26Oct2012) to 7. Fish Oil Concentrate 1000 MG Oral Capsule; TAKE 1 CAPSULE  DAILY; Therapy:  (Recorded:26Oct2012) to 8. Lisinopril 20 MG Oral Tablet; Therapy: (Recorded:26Oct2012) to 9. Lortab 7.5-500 MG Oral Tablet; Therapy: (Recorded:26Oct2012) to 10. Percocet 10-325 MG Oral Tablet; Therapy: (Recorded:26Oct2012) to 11. Vitamin C 500 MG Oral Tablet; 1 per day; Therapy: (Recorded:26Oct2012) to  Allergies No Known Allergies  1. No Known Allergies  Family History Problems  1. Paternal history of  Cholelithiasis 2. Family history of  Father Deceased At Age ____ 62: Heart Disease 3. Maternal history of  Heart Disease V17.49 4. Paternal history of  Heart Disease V17.49 5. Family history of  Mother Deceased At Age ____ 52: Heart Disease  Social History Problems    Caffeine Use 3-4 per day   Former Smoker V15.82 Patient started smoking at the age of 64 or 18. He got up to 2 packs per day. Smoked for 49-50 years. In college he would quit smoking at times for 6-7 months for about 5 years. After graduating college he began to smoke daily. Quit smoking 8 years ago in 2004. Denies any other forms of tobacco use.   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 Heavily drinking alcohol in his mid 20's. He quit drinking in 1995.  Review of Systems Genitourinary, constitutional, skin, eye, otolaryngeal, hematologic/lymphatic, cardiovascular, pulmonary, endocrine, musculoskeletal, gastrointestinal, neurological and psychiatric system(s) were reviewed and pertinent findings if present are noted.  Genitourinary: urinary frequency, nocturia and hematuria.  Musculoskeletal: joint pain.    Vitals Vital Signs [Data Includes: Last 1 Day]  26Oct2012 10:03AM  BMI Calculated:  26.53 BSA Calculated: 2.13 Height: 6 ft 0.5 in Weight: 198 lb  Blood Pressure: 166 / 80, LUE, Sitting Temperature: 97.5 F, Oral Heart Rate: 57  Physical Exam Constitutional: Well nourished  and well developed . No acute distress.  ENT:. The ears and nose are normal in appearance.  Neck: The appearance of the neck is normal and no neck mass is present.  Pulmonary: No respiratory distress and normal respiratory rhythm and effort.  Cardiovascular: Heart rate and rhythm are normal . No peripheral edema.  Abdomen: The abdomen is soft and nontender. No masses are palpated. No CVA tenderness. No hernias are palpable. No hepatosplenomegaly noted.  Rectal: Rectal exam demonstrates normal sphincter tone, no tenderness and no masses. Estimated prostate size is 2+. Normal rectal tone, no rectal masses, prostate is smooth, symmetric and non-tender. The prostate has no nodularity and is not tender. The left seminal vesicle is nonpalpable. The right seminal vesicle is nonpalpable. The perineum is normal on inspection.  Genitourinary: Examination of the penis demonstrates no discharge, no masses, no lesions and a normal meatus. The scrotum is without lesions. The right epididymis is palpably normal and non-tender. The left epididymis is palpably normal and non-tender. The right testis is non-tender and without masses. The left testis is non-tender and without masses.  Lymphatics: The femoral and inguinal nodes are not enlarged or tender.  Skin: Normal skin turgor, no visible rash and no visible skin lesions.  Neuro/Psych:. Mood and affect are appropriate.    Results/Data Urine [Data Includes: Last 1 Day]  26Oct2012  COLOR: YELLOW  Reference Range YELLOW APPEARANCE: CLEAR  Reference Range CLEAR SPECIFIC GRAVITY: 1.010  Reference Range 1.005-1.030 pH: 6.0  Reference Range 5.0-8.0 GLUCOSE: NEG mg/dL Reference Range NEG BILIRUBIN: NEG  Reference Range NEG KETONE: NEG mg/dL Reference Range NEG BLOOD: MOD  Abnormal Reference Range NEG PROTEIN: NEG mg/dL Reference Range NEG UROBILINOGEN: 0.2 mg/dL Reference Range 4.7-8.2 NITRITE: NEG  Reference Range NEG LEUKOCYTE ESTERASE: NEG  Reference Range  NEG SQUAMOUS EPITHELIAL/HPF: RARE  Reference Range RARE WBC: 0-3 WBC/hpf Reference Range <4 RBC: 11-20 RBC/hpf Abnormal Reference Range <4 BACTERIA: NONE SEEN  Reference Range RARE CRYSTALS: NONE SEEN  Reference Range NEG CASTS: NONE SEEN  Reference Range NEG  Procedure  Procedure: Cystoscopy Amended By: Barron Alvine; 02/25/2011 10:35 AMEST  Indication: Hematuria. Amended By: Barron Alvine; 02/25/2011 10:35 AMEST.  Informed Consent: Risks, benefits, and potential adverse events were discussed and informed consent was obtained Amended By: Barron Alvine; 02/25/2011 10:35 AMEST from the patient Amended By: Barron Alvine; 02/25/2011 10:35 AMEST . Specific risks including, but not limited to bleeding, infection, pain, allergic reaction etc. were explained. Amended By: Barron Alvine; 02/25/2011 10:35 AMEST.  Prep: The patient was prepped with betadine. Amended By: Barron Alvine; 02/25/2011 10:35 AMEST.  Anesthesia:. Local anesthesia was administered intraurethrally with 2% lidocaine jelly. Amended By: Barron Alvine; 02/25/2011 10:35 AMEST.  Antibiotic prophylaxis: Ciprofloxacin Amended By: Barron Alvine; 02/25/2011 10:35 AMEST.  Procedure Note:  Urethral meatus:. No abnormalities. Amended By: Barron Alvine; 02/25/2011 10:36 AMEST.  Anterior urethra: No abnormalities Amended By: Barron Alvine; 02/25/2011 10:36 AMEST.  Prostatic urethra:. There was visual obstruction of the prostatic urethra. (mild ) Amended By: Barron Alvine; 02/25/2011 10:36 AMEST Amended By: Barron Alvine; 02/25/2011 10:36 AMEST . The lateral and median prostatic lobes were enlarged. Amended By: Barron Alvine; 02/25/2011 10:36 AMEST.  Bladder: Visulization was clear Amended By: Barron Alvine; 02/25/2011 10:36 AMEST. The ureteral orifices had clear efflux of urine Amended By: Barron Alvine;  02/25/2011 10:36 AMEST. A systematic survey of the bladder demonstrated no bladder tumors or stones Amended By: Barron Alvine; 02/25/2011 10:36  AMEST The mucosa was smooth without abnormalities Amended By: Barron Alvine; 02/25/2011 10:36 AMEST The patient tolerated the procedure well Amended By: Barron Alvine; 02/25/2011 10:36 AMEST  Complications: None. Amended By: Barron Alvine; 02/25/2011 10:36 AMEST.    Assessment Assessed  1. Gross Hematuria 599.71 2. Benign Prostatic Hypertrophy With Urinary Obstruction 600.01  Plan Benign Prostatic Hypertrophy With Urinary Obstruction (600.01)  1. Cysto  Requested for: 26Oct2012 Gross Hematuria (599.71)  2. URINE CYTOLOGY  Requested for: 26Oct2012 Health Maintenance (V70.0)  3. UA With REFLEX  Done: 26Oct2012 08:46AM  Discussion/Summary   [name] has had several episodes of recurrent asymptomatic gross hematuria. Cystoscopically things look normal today without evidence of obvious bladder tumor and clear efflux of urine from both sides. He does need to have his upper tracts assessed. Urine will be sent for cytology today and we will have him return probably next week for a hematuria protocol CT scan of his abdomen and pelvis. If that fails to show any pathology, then the hematuria may have been secondary to some benign prostatic hyperplasia. If it becomes a more persistent or problematic issue, then in that setting medication like finasteride or Avodart can improve benign prostatic hyperplasia induced gross hematuria. It is also conceivable we will have to repeat cystoscopy while he is actively bleeding to confirm oozing from the prostate/bladder neck region. It is important at this point, however, to rule out any upper tract pathology. He will be leaving the state for approximately 4-6 months and so at the very least we will set him up for routine follow-up in 6 months, assuming imaging studies and urine cytology are unremarkable.   Addendum: CT scan looked ok but urine cytology was positive. Discussed by phone with patient and have recommended that we proceed with cystoscopy/ possible Bx and  bilateral retrogrades.

## 2011-03-11 NOTE — Telephone Encounter (Signed)
Ok per Dr Jenkins, rx up front ready for p/u, pt aware 

## 2011-03-12 NOTE — Assessment & Plan Note (Signed)
BP Readings from Last 3 Encounters:  03/10/11 129/79  03/03/11 134/82  08/09/10 134/76   Controlled. Continue current meds

## 2011-03-12 NOTE — Assessment & Plan Note (Signed)
Continue current meds Stay well hydrated He has not had any alcohol in years

## 2011-03-14 ENCOUNTER — Other Ambulatory Visit: Payer: Self-pay | Admitting: Urology

## 2011-03-14 ENCOUNTER — Other Ambulatory Visit: Payer: Self-pay

## 2011-03-14 ENCOUNTER — Ambulatory Visit (HOSPITAL_BASED_OUTPATIENT_CLINIC_OR_DEPARTMENT_OTHER)
Admission: RE | Admit: 2011-03-14 | Discharge: 2011-03-14 | Disposition: A | Discharge status: Home / Self Care | Payer: Medicare Other | Source: Ambulatory Visit | Attending: Urology | Admitting: Urology

## 2011-03-14 ENCOUNTER — Encounter (HOSPITAL_BASED_OUTPATIENT_CLINIC_OR_DEPARTMENT_OTHER): Payer: Self-pay | Admitting: Certified Registered"

## 2011-03-14 ENCOUNTER — Ambulatory Visit (HOSPITAL_BASED_OUTPATIENT_CLINIC_OR_DEPARTMENT_OTHER): Payer: Medicare Other | Admitting: Certified Registered"

## 2011-03-14 ENCOUNTER — Encounter (HOSPITAL_BASED_OUTPATIENT_CLINIC_OR_DEPARTMENT_OTHER)
Admission: RE | Disposition: A | Discharge status: Home / Self Care | Payer: Self-pay | Source: Ambulatory Visit | Attending: Urology

## 2011-03-14 DIAGNOSIS — N401 Enlarged prostate with lower urinary tract symptoms: Secondary | ICD-10-CM | POA: Insufficient documentation

## 2011-03-14 DIAGNOSIS — R310 Gross hematuria: Secondary | ICD-10-CM | POA: Insufficient documentation

## 2011-03-14 DIAGNOSIS — N138 Other obstructive and reflux uropathy: Secondary | ICD-10-CM | POA: Insufficient documentation

## 2011-03-14 DIAGNOSIS — E785 Hyperlipidemia, unspecified: Secondary | ICD-10-CM | POA: Insufficient documentation

## 2011-03-14 DIAGNOSIS — I10 Essential (primary) hypertension: Secondary | ICD-10-CM | POA: Insufficient documentation

## 2011-03-14 DIAGNOSIS — Z7982 Long term (current) use of aspirin: Secondary | ICD-10-CM | POA: Insufficient documentation

## 2011-03-14 DIAGNOSIS — Z79899 Other long term (current) drug therapy: Secondary | ICD-10-CM | POA: Insufficient documentation

## 2011-03-14 DIAGNOSIS — Z8739 Personal history of other diseases of the musculoskeletal system and connective tissue: Secondary | ICD-10-CM | POA: Insufficient documentation

## 2011-03-14 DIAGNOSIS — N329 Bladder disorder, unspecified: Secondary | ICD-10-CM | POA: Insufficient documentation

## 2011-03-14 HISTORY — PX: CYSTOSCOPY WITH BIOPSY: SHX5122

## 2011-03-14 HISTORY — DX: Alcohol-induced chronic pancreatitis: K86.0

## 2011-03-14 HISTORY — PX: CYSTOSCOPY W/ RETROGRADES: SHX1426

## 2011-03-14 HISTORY — DX: Unspecified osteoarthritis, unspecified site: M19.90

## 2011-03-14 HISTORY — DX: Gross hematuria: R31.0

## 2011-03-14 LAB — POCT I-STAT 4, (NA,K, GLUC, HGB,HCT): HCT: 43 % (ref 39.0–52.0)

## 2011-03-14 SURGERY — CYSTOSCOPY, WITH RETROGRADE PYELOGRAM
Anesthesia: General | Wound class: Clean Contaminated

## 2011-03-14 MED ORDER — LACTATED RINGERS IV SOLN
INTRAVENOUS | Status: DC
  Administered 2011-03-14: 125 mL/h via INTRAVENOUS

## 2011-03-14 MED ORDER — FENTANYL CITRATE 0.05 MG/ML IJ SOLN
INTRAMUSCULAR | Status: DC | PRN
  Administered 2011-03-14: 50 ug via INTRAVENOUS

## 2011-03-14 MED ORDER — PHENAZOPYRIDINE HCL 200 MG PO TABS: 200.0000 mg | ORAL_TABLET | Freq: Three times a day (TID) | ORAL | Status: AC | PRN

## 2011-03-14 MED ORDER — PROMETHAZINE HCL 25 MG/ML IJ SOLN: 6.2500 mg | INTRAMUSCULAR | Status: DC | PRN

## 2011-03-14 MED ORDER — CIPROFLOXACIN IN D5W 400 MG/200ML IV SOLN
400.0000 mg | INTRAVENOUS | Status: AC
  Administered 2011-03-14: 400 mg via INTRAVENOUS

## 2011-03-14 MED ORDER — SODIUM CHLORIDE 0.9 % IR SOLN
Status: DC | PRN
  Administered 2011-03-14: 3000 mL

## 2011-03-14 MED ORDER — MIDAZOLAM HCL 5 MG/5ML IJ SOLN
INTRAMUSCULAR | Status: DC | PRN
  Administered 2011-03-14: 1 mg via INTRAVENOUS

## 2011-03-14 MED ORDER — FENTANYL CITRATE 0.05 MG/ML IJ SOLN: 25.0000 ug | INTRAMUSCULAR | Status: DC | PRN

## 2011-03-14 MED ORDER — PROPOFOL 10 MG/ML IV EMUL
INTRAVENOUS | Status: DC | PRN
  Administered 2011-03-14: 160 mg via INTRAVENOUS

## 2011-03-14 MED ORDER — IOHEXOL 350 MG/ML SOLN
INTRAVENOUS | Status: DC | PRN
  Administered 2011-03-14: 10 mL

## 2011-03-14 MED ORDER — LIDOCAINE HCL (CARDIAC) 20 MG/ML IV SOLN
INTRAVENOUS | Status: DC | PRN
  Administered 2011-03-14: 80 mg via INTRAVENOUS

## 2011-03-14 MED ORDER — STERILE WATER FOR IRRIGATION IR SOLN
Status: DC | PRN
  Administered 2011-03-14: 3000 mL

## 2011-03-14 MED ORDER — HYDROCODONE-ACETAMINOPHEN 5-325 MG PO TABS: 1.0000 | ORAL_TABLET | Freq: Four times a day (QID) | ORAL | Status: AC | PRN

## 2011-03-14 MED ORDER — ONDANSETRON HCL 4 MG/2ML IJ SOLN
INTRAMUSCULAR | Status: DC | PRN
  Administered 2011-03-14: 4 mg via INTRAVENOUS

## 2011-03-14 MED ORDER — LIDOCAINE HCL 2 % EX GEL
CUTANEOUS | Status: DC | PRN
  Administered 2011-03-14: 1

## 2011-03-14 SURGICAL SUPPLY — 37 items
ADAPTER CATH URET PLST 4-6FR (CATHETERS) ×3 IMPLANT
BAG DRAIN URO-CYSTO SKYTR STRL (DRAIN) ×3 IMPLANT
BENZOIN TINCTURE PRP APPL 2/3 (GAUZE/BANDAGES/DRESSINGS) IMPLANT
CANISTER SUCT LVC 12 LTR MEDI- (MISCELLANEOUS) ×3 IMPLANT
CATH INTERMIT  6FR 70CM (CATHETERS) IMPLANT
CATH ROBINSON RED A/P 14FR (CATHETERS) IMPLANT
CATH ROBINSON RED A/P 16FR (CATHETERS) IMPLANT
CATH URET 5FR 28IN CONE TIP (BALLOONS)
CATH URET 5FR 28IN OPEN ENDED (CATHETERS) IMPLANT
CATH URET 5FR 70CM CONE TIP (BALLOONS) IMPLANT
CLOTH BEACON ORANGE TIMEOUT ST (SAFETY) ×3 IMPLANT
DRAPE CAMERA CLOSED 9X96 (DRAPES) ×3 IMPLANT
ELECT REM PT RETURN 9FT ADLT (ELECTROSURGICAL) ×3
ELECTRODE REM PT RTRN 9FT ADLT (ELECTROSURGICAL) ×2 IMPLANT
GLOVE BIO SURGEON STRL SZ7.5 (GLOVE) ×3 IMPLANT
GLOVE ECLIPSE 6.5 STRL STRAW (GLOVE) ×3 IMPLANT
GLOVE INDICATOR 6.5 STRL GRN (GLOVE) ×6 IMPLANT
GOWN STRL NON-REIN LRG LVL3 (GOWN DISPOSABLE) ×6 IMPLANT
GOWN STRL REIN XL XLG (GOWN DISPOSABLE) ×3 IMPLANT
GUIDEWIRE 0.038 PTFE COATED (WIRE) IMPLANT
GUIDEWIRE ANG ZIPWIRE 038X150 (WIRE) IMPLANT
GUIDEWIRE STR DUAL SENSOR (WIRE) ×3 IMPLANT
IV NS IRRIG 3000ML ARTHROMATIC (IV SOLUTION) ×3 IMPLANT
KIT BALLIN UROMAX 15FX10 (LABEL) IMPLANT
KIT BALLN UROMAX 15FX4 (MISCELLANEOUS) IMPLANT
KIT BALLN UROMAX 26 75X4 (MISCELLANEOUS)
NDL SAFETY ECLIPSE 18X1.5 (NEEDLE) IMPLANT
NEEDLE HYPO 18GX1.5 SHARP (NEEDLE)
NEEDLE HYPO 22GX1.5 SAFETY (NEEDLE) IMPLANT
NS IRRIG 500ML POUR BTL (IV SOLUTION) IMPLANT
PACK CYSTOSCOPY (CUSTOM PROCEDURE TRAY) ×3 IMPLANT
SET HIGH PRES BAL DIL (LABEL)
SHEATH URET ACCESS 12FR/35CM (UROLOGICAL SUPPLIES) IMPLANT
SHEATH URET ACCESS 12FR/55CM (UROLOGICAL SUPPLIES) IMPLANT
SYR 20CC LL (SYRINGE) IMPLANT
SYR BULB IRRIGATION 50ML (SYRINGE) IMPLANT
WATER STERILE IRR 3000ML UROMA (IV SOLUTION) ×3 IMPLANT

## 2011-03-14 NOTE — Interval H&P Note (Signed)
History and Physical Interval Note:   03/14/2011   9:07 AM   [name]  has presented today for surgery, with the diagnosis of gross hematuria  The various methods of treatment have been discussed with the patient and family. After consideration of risks, benefits and other options for treatment, the patient has consented to  Procedure(s): CYSTOSCOPY WITH RETROGRADE PYELOGRAM CYSTOSCOPY WITH BIOPSY as a surgical intervention .  The patients' history has been reviewed, patient examined, no change in status, stable for surgery.  I have reviewed the patients' chart and labs.  Questions were answered to the patient's satisfaction.     Valetta Fuller  MD

## 2011-03-14 NOTE — Op Note (Signed)
Preoperative diagnosis: Gross hematuria and positive urine cytology Postoperative diagnosis: Same Procedure cystoscopy: bladder washings for cytology, bladder biopsy with fulguration, bilateral retrograde pyelograms. Surgeon: Valetta Fuller M.D. Anesthesia: Gen.  Indications: Patient is a 70 year old male who presented with gross hematuria. Evaluation in our office included a cystoscopy as well as a CT scan of the abdomen and pelvis with IV contrast. Those studies failed to show anything of significance. The patient's urine cytology however was positive for malignant cells and therefore we felt that the patient needed an additional assessment to rule out an obvious problem that was missed on his initial evaluation. He has had no further episodes of gross hematuria.  Technique and findings: The patient was brought to the operating room where he had successful induction of general anesthesia. He had placement of PAS boot compression boots and perioperative antibiotics. He was placed in the lithotomy position. He was prepped and draped in normal manner and appropriate surgical timeout was performed. Cystoscopy revealed unremarkable anterior and prostatic urethra. The patient had mild trilobar hyperplasia. The bladder was carefully panendoscope with the 12 and 70 lens systems and no pathology was appreciated. There were no areas consistent with carcinoma in situ and no obvious bladder tumors noted. There was a slight area urethane the right at the bladder neck at the 6:00 position that we felt was probably related to the trauma from the cystoscope. This area however was biopsied with cold cup biopsy and then fulgurated with a Bugbee electrode. We then performed a saline bladder barbotage to obtain the washings for repeat cytology.  He is bilateral retrograde polygrams were then done with a 8 [place] cone-tipped catheter. The retrograde pyelograms were done with fluoroscopic interpretation. Both ureters and  collecting systems were delicate without evidence of filling defect or obvious obstruction. The lower pole collecting system left kidney did appear to be splayed out presumptively due to a renal cyst but I will need to review his CT scan to see if indeed the cyst was noted in that location. Nothing suggestive of transitional cell carcinoma was noted. The patient's bladder was drained and he was brought to recovery room in stable condition having had no obvious complications or problems.

## 2011-03-14 NOTE — Anesthesia Preprocedure Evaluation (Addendum)
Anesthesia Evaluation  Patient identified by MRN, date of birth, ID band Patient awake    Reviewed: Allergy & Precautions, H&P , NPO status , Patient's Chart, lab work & pertinent test results, reviewed documented beta blocker date and time   Airway Mallampati: II TM Distance: >3 FB Neck ROM: Full    Dental No notable dental hx.    Pulmonary neg pulmonary ROS,  clear to auscultation        Cardiovascular hypertension, Pt. on medications Regular Normal Denies cardiac symptoms ASPVD-AAA   Neuro/Psych Negative Neurological ROS  Negative Psych ROS   GI/Hepatic Neg liver ROS, Chronic Pancreatitis   Endo/Other  Negative Endocrine ROS  Renal/GU negative Renal ROS   Gross hematuria    Musculoskeletal negative musculoskeletal ROS (+)   Abdominal   Peds negative pediatric ROS (+)  Hematology negative hematology ROS (+)   Anesthesia Other Findings   Reproductive/Obstetrics negative OB ROS                           Anesthesia Physical Anesthesia Plan  ASA: III  Anesthesia Plan: General   Post-op Pain Management:    Induction: Intravenous  Airway Management Planned: LMA  Additional Equipment:   Intra-op Plan:   Post-operative Plan: Extubation in OR  Informed Consent: I have reviewed the patients History and Physical, chart, labs and discussed the procedure including the risks, benefits and alternatives for the proposed anesthesia with the patient or authorized representative who has indicated his/her understanding and acceptance.     Plan Discussed with: CRNA and Surgeon  Anesthesia Plan Comments:        Anesthesia Quick Evaluation

## 2011-03-14 NOTE — Anesthesia Postprocedure Evaluation (Signed)
  Anesthesia Post-op Note  Patient: [name]  Procedure(s) Performed:  CYSTOSCOPY WITH RETROGRADE PYELOGRAM; CYSTOSCOPY WITH BIOPSY - c arm   Patient Location: PACU  Anesthesia Type: General  Level of Consciousness: oriented and sedated  Airway and Oxygen Therapy: Patient Spontanous Breathing  Post-op Pain: mild  Post-op Assessment: Post-op Vital signs reviewed, Patient's Cardiovascular Status Stable, Respiratory Function Stable and Patent Airway  Post-op Vital Signs: stable  Complications: No apparent anesthesia complications

## 2011-03-14 NOTE — Transfer of Care (Signed)
Immediate Anesthesia Transfer of Care Note  Patient: [name]  Procedure(s) Performed:  CYSTOSCOPY WITH RETROGRADE PYELOGRAM; CYSTOSCOPY WITH BIOPSY - c arm   Patient Location: PACU  Anesthesia Type: General  Level of Consciousness: awake, oriented, sedated and patient cooperative  Airway & Oxygen Therapy: Patient Spontanous Breathing and Patient connected to face mask oxygen  Post-op Assessment: Report given to PACU RN and Post -op Vital signs reviewed and stable  Post vital signs: Reviewed and stable  Complications: No apparent anesthesia complications

## 2011-03-17 ENCOUNTER — Encounter (HOSPITAL_BASED_OUTPATIENT_CLINIC_OR_DEPARTMENT_OTHER): Payer: Self-pay | Admitting: Urology

## 2011-03-22 ENCOUNTER — Telehealth: Payer: Self-pay | Admitting: Internal Medicine

## 2011-03-22 MED ORDER — ROSUVASTATIN CALCIUM 10 MG PO TABS: 10.0000 mg | ORAL_TABLET | Freq: Every evening | ORAL | Status: DC

## 2011-03-22 NOTE — Telephone Encounter (Signed)
Refill Crestor at [facility]. Thanks.

## 2011-03-22 NOTE — Telephone Encounter (Signed)
rx sent in electronically 

## 2011-04-04 ENCOUNTER — Telehealth: Payer: Self-pay | Admitting: Internal Medicine

## 2011-04-04 NOTE — Telephone Encounter (Signed)
Pt requesting refill on oxyCODONE-acetaminophen (PERCOCET) 10-325 MG per tablet   pt requesting it be mailed to: [address] [place] [address]

## 2011-04-06 ENCOUNTER — Telehealth: Payer: Self-pay | Admitting: Internal Medicine

## 2011-04-06 MED ORDER — OXYCODONE-ACETAMINOPHEN 10-325 MG PO TABS: 1.0000 | ORAL_TABLET | ORAL | Status: DC | PRN

## 2011-04-06 NOTE — Telephone Encounter (Signed)
Pt is calling back checking on status of rx. Pt is aware may take up to 72 hrs

## 2011-04-06 NOTE — Telephone Encounter (Signed)
rx mailed

## 2011-04-06 NOTE — Telephone Encounter (Signed)
Pt called and said that the script for oxyCODONE-acetaminophen (PERCOCET) 10-325 MG per tablet  Is suppose to be mailed to:[address] [place] [address]  Pt said that he lft some self addressed stamped envelops in past. Pls be sure to notify pt, when scripts have been mailed.

## 2011-04-06 NOTE — Telephone Encounter (Signed)
rx up front ready for p/u, pt aware 

## 2011-04-13 ENCOUNTER — Telehealth: Payer: Self-pay | Admitting: Internal Medicine

## 2011-04-13 MED ORDER — OXYCODONE-ACETAMINOPHEN 10-325 MG PO TABS: 1.0000 | ORAL_TABLET | ORAL | Status: DC | PRN

## 2011-04-13 NOTE — Telephone Encounter (Signed)
[facility] in [place], [place] is req refill of pts HYDROcodone-acetaminophen (LORTAB) 7.5-500 MG per tablet. Their fax machine is not working, so pls call in to pharmacist [phone]

## 2011-04-14 MED ORDER — HYDROCODONE-ACETAMINOPHEN 7.5-500 MG PO TABS: 1.0000 | ORAL_TABLET | ORAL | Status: DC | PRN

## 2011-04-14 NOTE — Telephone Encounter (Signed)
ok per Dr Cato Mulligan, rx called in to pharmacy

## 2011-04-29 ENCOUNTER — Telehealth: Payer: Self-pay | Admitting: Internal Medicine

## 2011-04-29 NOTE — Telephone Encounter (Signed)
Pt need new rx percocet mail to him

## 2011-04-29 NOTE — Telephone Encounter (Signed)
Pt is not due till 05/07/11 and Dr Cato Mulligan is out of the office, pt aware

## 2011-05-06 ENCOUNTER — Telehealth: Payer: Self-pay | Admitting: Internal Medicine

## 2011-05-06 NOTE — Telephone Encounter (Signed)
Refill Lisinopril 20mg . Thanks.

## 2011-05-09 MED ORDER — LISINOPRIL 20 MG PO TABS: 20.0000 mg | ORAL_TABLET | Freq: Every day | ORAL | Status: DC

## 2011-05-09 NOTE — Telephone Encounter (Signed)
rx sent in electronically 

## 2011-05-10 ENCOUNTER — Telehealth: Payer: Self-pay | Admitting: Internal Medicine

## 2011-05-10 MED ORDER — OXYCODONE-ACETAMINOPHEN 10-325 MG PO TABS: 1.0000 | ORAL_TABLET | ORAL | Status: DC | PRN

## 2011-05-10 NOTE — Telephone Encounter (Signed)
Pt called req refill of oxyCODONE-acetaminophen (PERCOCET) 10-325 MG per tablet. Pt req this be mailed to [address] [place] [address].

## 2011-05-10 NOTE — Telephone Encounter (Signed)
rx mailed to pt

## 2011-06-07 ENCOUNTER — Telehealth: Payer: Self-pay | Admitting: Internal Medicine

## 2011-06-07 MED ORDER — OXYCODONE-ACETAMINOPHEN 10-325 MG PO TABS: 1.0000 | ORAL_TABLET | ORAL | Status: DC | PRN

## 2011-06-07 NOTE — Telephone Encounter (Signed)
Pt called and said that the script for oxyCODONE-acetaminophen (PERCOCET) 10-325 MG per tablet  °Is suppose to be mailed to:[address] [place] [address]  Pt said that he lft some self addressed stamped envelops in past. Pls be sure to notify pt, when scripts have been mailed.  ° ° °

## 2011-06-07 NOTE — Telephone Encounter (Signed)
rx mailed to pt, pt aware 

## 2011-06-13 ENCOUNTER — Other Ambulatory Visit: Payer: Self-pay | Admitting: Internal Medicine

## 2011-07-05 ENCOUNTER — Telehealth: Payer: Self-pay | Admitting: Internal Medicine

## 2011-07-05 MED ORDER — OXYCODONE-ACETAMINOPHEN 10-325 MG PO TABS: 1.0000 | ORAL_TABLET | ORAL | Status: DC | PRN

## 2011-07-05 NOTE — Telephone Encounter (Signed)
Patient called stating he need a refill on his percocet. Please assist.

## 2011-07-05 NOTE — Telephone Encounter (Signed)
Need to know whether to mail to [place] address or pt will pick up.  Left message for pt to call back

## 2011-07-05 NOTE — Telephone Encounter (Signed)
Pt would like it mailed to [place]

## 2011-08-04 ENCOUNTER — Other Ambulatory Visit: Payer: Self-pay | Admitting: *Deleted

## 2011-08-04 DIAGNOSIS — I714 Abdominal aortic aneurysm, without rupture: Secondary | ICD-10-CM

## 2011-08-05 ENCOUNTER — Telehealth: Payer: Self-pay | Admitting: Internal Medicine

## 2011-08-05 MED ORDER — OXYCODONE-ACETAMINOPHEN 10-325 MG PO TABS: 1.0000 | ORAL_TABLET | ORAL | Status: DC | PRN

## 2011-08-05 NOTE — Telephone Encounter (Signed)
Pt called req refill of oxyCODONE-acetaminophen (PERCOCET) 10-325 MG per tablet. Pt is back in [place], so he will pick up script when ready. Pls call.

## 2011-08-05 NOTE — Telephone Encounter (Signed)
rx up front ready for p/u, pt aware 

## 2011-08-08 ENCOUNTER — Other Ambulatory Visit: Payer: Self-pay | Admitting: Internal Medicine

## 2011-08-10 ENCOUNTER — Encounter: Payer: Self-pay | Admitting: Vascular Surgery

## 2011-08-10 ENCOUNTER — Encounter: Payer: Self-pay | Admitting: Neurosurgery

## 2011-08-11 ENCOUNTER — Encounter: Payer: Self-pay | Admitting: Neurosurgery

## 2011-08-11 ENCOUNTER — Encounter (INDEPENDENT_AMBULATORY_CARE_PROVIDER_SITE_OTHER): Payer: Medicare Other | Admitting: *Deleted

## 2011-08-11 ENCOUNTER — Ambulatory Visit (INDEPENDENT_AMBULATORY_CARE_PROVIDER_SITE_OTHER): Payer: Medicare Other | Admitting: Neurosurgery

## 2011-08-11 VITALS — BP 141/83 | HR 53 | Resp 16 | Ht 73.0 in | Wt 196.3 lb

## 2011-08-11 DIAGNOSIS — I714 Abdominal aortic aneurysm, without rupture, unspecified: Secondary | ICD-10-CM

## 2011-08-11 NOTE — Progress Notes (Signed)
[facility] HISTORY AND PHYSICAL   CC: Annual AAA duplex Referring Physician: Fields  History of Present Illness: 71 year old male patient of Dr. feels seen for known AAA, last seen November 2012 at which time he had a CT performed at [facility] showing a 4.6 cm AAA. The patient lives the winter months in [place] and resides here spring and summer. He reports no abdominal pain, no back pain, no lower extremity rest pain or pain with ambulation.  Past Medical History  Diagnosis Date  . Hx of colonic polyps   . Diverticulosis   . Hyperlipidemia   . AAA (abdominal aortic aneurysm) last ct 03-02-11    4.6cm  per note from dr fields w/ chart  . Substance abuse hx alcohol abuse  . Alcohol-induced chronic pancreatitis   . Hypertension stress test 1996- states wnl  . Peripheral vascular disease     occlusion left external iliac artery   . Gross hematuria     freq/ urge/ nocturia  . Arthritis     knees  . Peripheral arterial disease     ROS: [x]  Positive   [ ]  Denies    General: [ ]  Weight loss, [ ]  Fever, [ ]  chills Neurologic: [ ]  Dizziness, [ ]  Blackouts, [ ]  Seizure [ ]  Stroke, [ ]  "Mini stroke", [ ]  Slurred speech, [ ]  Temporary blindness; [ ]  weakness in arms or legs, [ ]  Hoarseness Cardiac: [ ]  Chest pain/pressure, [ ]  Shortness of breath at rest [ ]  Shortness of breath with exertion, [ ]  Atrial fibrillation or irregular heartbeat Vascular: [ ]  Pain in legs with walking, [ ]  Pain in legs at rest, [ ]  Pain in legs at night,  [ ]  Non-healing ulcer, [ ]  Blood clot in vein/DVT,   Pulmonary: [ ]  Home oxygen, [ ]  Productive cough, [ ]  Coughing up blood, [ ]  Asthma,  [ ]  Wheezing Musculoskeletal:  [ ]  Arthritis, [ ]  Low back pain, [ ]  Joint pain Hematologic: [ ]  Easy Bruising, [ ]  Anemia; [ ]  Hepatitis Gastrointestinal: [ ]  Blood in stool, [ ]  Gastroesophageal Reflux/heartburn, [ ]  Trouble swallowing Urinary: [ ]  chronic Kidney disease, [ ]   on HD - [ ]  MWF or [ ]  TTHS, [ ]  Burning with urination, [ ]  Difficulty urinating Skin: [ ]  Rashes, [ ]  Wounds Psychological: [ ]  Anxiety, [ ]  Depression   Social History History  Substance Use Topics  . Smoking status: Former Smoker    Quit date: 03/11/2003  . Smokeless tobacco: Never Used  . Alcohol Use: No     hx alcohol abuse-- quit 1995    Family History Family History  Problem Relation Age of Onset  . Heart disease Mother   . Heart attack Father   . Heart disease Father   . Hypertension Brother     No Known Allergies  Current Outpatient Prescriptions  Medication Sig Dispense Refill  . amLODipine (NORVASC) 5 MG tablet Take 5 mg by mouth daily.        . Ascorbic Acid (VITAMIN C) 500 MG tablet Take 500 mg by mouth daily.       Marland Kitchen aspirin 81 MG tablet Take 81 mg by mouth daily.       . diphenhydrAMINE (BENADRYL) 25 mg capsule Take 25 mg by mouth at bedtime as needed.       Tery Sanfilippo Sodium (PHILLIPS STOOL SOFTENER PO) Take by mouth daily.       Marland Kitchen  fish oil-omega-3 fatty acids 1000 MG capsule Take 1 g by mouth daily.       Marland Kitchen HYDROcodone-acetaminophen (LORTAB) 7.5-500 MG per tablet TAKE ONE TABLET BY MOUTH EVERY 4 HOURS IF NEEDED FOR PAIN  100 tablet  2  . lipase/protease/amylase (CREON) 12000 UNITS CPEP Take 2 capsules by mouth 3 (three) times daily before meals.  270 capsule  3  . lisinopril (PRINIVIL,ZESTRIL) 20 MG tablet Take 1 tablet (20 mg total) by mouth daily.  90 tablet  2  . Multiple Vitamin (MULTIVITAMIN) tablet Take 1 tablet by mouth daily.        Marland Kitchen oxyCODONE-acetaminophen (PERCOCET) 10-325 MG per tablet Take 1 tablet by mouth every 4 (four) hours as needed. In the evening  60 tablet  0  . rosuvastatin (CRESTOR) 10 MG tablet Take 1 tablet (10 mg total) by mouth every evening.  90 tablet  3    Physical Examination  Filed Vitals:   08/11/11 1018  BP: 141/83  Pulse: 53  Resp: 16    Body mass index is 25.90 kg/(m^2).  General:  WDWN in NAD Gait:  Normal HEENT: WNL Eyes: Pupils equal Pulmonary: normal non-labored breathing , without Rales, rhonchi,  wheezing Cardiac: RRR, without  Murmurs, rubs or gallops; No carotid bruits Abdomen: soft, NT, no masses Skin: no rashes, ulcers noted Vascular Exam/Pulses: 2+ radial pulses bilaterally, DP and PT are 1+ bilaterally, femoral pulses are not palpable.  Extremities without ischemic changes, no Gangrene , no cellulitis; no open wounds;  Musculoskeletal: no muscle wasting or atrophy  Neurologic: A&O X 3; Appropriate Affect ; SENSATION: normal; MOTOR FUNCTION:  moving all extremities equally. Speech is fluent/normal  Non-Invasive Vascular Imaging: Duplex of the AAA today shows admitted and transverse measurement of 4.29  ASSESSMENT/PLAN: I discussed the findings with Dr. Darrick Penna, he agrees to followup can be in one year, the patient's questions were encouraged and answered.  Lauree Chandler ANP X.  Clinic M.D.: Fields

## 2011-08-17 ENCOUNTER — Encounter: Payer: Self-pay | Admitting: Internal Medicine

## 2011-08-17 ENCOUNTER — Ambulatory Visit (INDEPENDENT_AMBULATORY_CARE_PROVIDER_SITE_OTHER): Payer: Medicare Other | Admitting: Internal Medicine

## 2011-08-17 VITALS — BP 146/84 | HR 72 | Temp 98.1°F | Ht 73.0 in | Wt 197.0 lb

## 2011-08-17 DIAGNOSIS — I714 Abdominal aortic aneurysm, without rupture: Secondary | ICD-10-CM

## 2011-08-17 DIAGNOSIS — R310 Gross hematuria: Secondary | ICD-10-CM

## 2011-08-17 DIAGNOSIS — I10 Essential (primary) hypertension: Secondary | ICD-10-CM

## 2011-08-17 DIAGNOSIS — E785 Hyperlipidemia, unspecified: Secondary | ICD-10-CM

## 2011-08-17 DIAGNOSIS — K859 Acute pancreatitis without necrosis or infection, unspecified: Secondary | ICD-10-CM

## 2011-08-17 LAB — BASIC METABOLIC PANEL
CO2: 26 mEq/L (ref 19–32)
Calcium: 10.1 mg/dL (ref 8.4–10.5)
Chloride: 103 mEq/L (ref 96–112)
Potassium: 4.5 mEq/L (ref 3.5–5.1)
Sodium: 139 mEq/L (ref 135–145)

## 2011-08-17 LAB — LIPID PANEL
HDL: 50.2 mg/dL (ref >39.00)
LDL Cholesterol: 68 mg/dL (ref 0–99)
Total CHOL/HDL Ratio: 3
Triglycerides: 135 mg/dL (ref 0.0–149.0)
VLDL: 27 mg/dL (ref 0.0–40.0)

## 2011-08-17 LAB — HEPATIC FUNCTION PANEL
AST: 33 U/L (ref 0–37)
Albumin: 4.4 g/dL (ref 3.5–5.2)

## 2011-08-17 MED ORDER — ROSUVASTATIN CALCIUM 10 MG PO TABS: 10.0000 mg | ORAL_TABLET | Freq: Every evening | ORAL | Status: DC

## 2011-08-17 MED ORDER — AMLODIPINE BESYLATE 5 MG PO TABS: 5.0000 mg | ORAL_TABLET | Freq: Every day | ORAL | Status: DC

## 2011-08-17 NOTE — Assessment & Plan Note (Signed)
Tolerating meds Check labs today 

## 2011-08-17 NOTE — Assessment & Plan Note (Signed)
Pain is well controlled with current regimen.

## 2011-08-17 NOTE — Assessment & Plan Note (Signed)
Fair control Continue current meds 

## 2011-08-17 NOTE — Assessment & Plan Note (Signed)
No known diagnosis but has regular f/u with urology

## 2011-08-17 NOTE — Assessment & Plan Note (Signed)
Has regular f/u with [facility]

## 2011-08-19 NOTE — Procedures (Unsigned)
DUPLEX ULTRASOUND OF ABDOMINAL AORTA  INDICATION:  Followup AAA.  HISTORY: Diabetes:  No Cardiac:  no Hypertension:  Yes Smoking:  Previous. Connective Tissue Disorder: Family History:  No Previous Surgery:  No Hyperlipidemia.  DUPLEX EXAM:         AP (cm)                   TRANSVERSE (cm) Proximal             2.82 cm                   2.83 cm Mid                  4.29 cm                   4.29 cm Distal               2.31 cm                   cm Right Iliac          Not visualized Left Iliac           Not visualized  PREVIOUS:  Date: 03/02/2011  AP:  4.6  TRANSVERSE: Performed at [facility]  IMPRESSION:  AAA noted today with largest measurement of 4.29 x 4.29 cm. Due to overlying bowel gas with acoustic shadowing, there was limited visualization.  ___________________________________________ Janetta Hora Fields, MD  EM/MEDQ  D:  08/12/2011  T:  08/12/2011  Job:  [id number]

## 2011-08-21 NOTE — Progress Notes (Signed)
Patient ID: [name], male   DOB: 02-18-1941, 71 y.o.   MRN: [id number] htn---taking meds, no home BPs  Chronic pancreatitis--- pain controlled  AAA-- has regular f/u  Past Medical History  Diagnosis Date  . Hx of colonic polyps   . Diverticulosis   . Hyperlipidemia   . AAA (abdominal aortic aneurysm) last ct 03-02-11    4.6cm  per note from dr fields w/ chart  . Substance abuse hx alcohol abuse  . Alcohol-induced chronic pancreatitis   . Hypertension stress test 1996- states wnl  . Peripheral vascular disease     occlusion left external iliac artery   . Gross hematuria     freq/ urge/ nocturia  . Arthritis     knees  . Peripheral arterial disease     History   Social History  . Marital Status: Divorced    Spouse Name: N/A    Number of Children: N/A  . Years of Education: N/A   Occupational History  . Not on file.   Social History Main Topics  . Smoking status: Former Smoker    Quit date: 03/11/2003  . Smokeless tobacco: Never Used  . Alcohol Use: No     hx alcohol abuse-- quit 1995  . Drug Use: No  . Sexually Active:    Other Topics Concern  . Not on file   Social History Narrative  . No narrative on file    Past Surgical History  Procedure Date  . Knee surgery 1961    left  . Wrist fracture surgery 2010    orif left wrist  . Cataract extraction w/ intraocular lens  implant, bilateral   . Hernia repair 2010    Left inguinal herniorraphy with mesh  . Cystoscopy w/ retrogrades 03/14/2011    Procedure: CYSTOSCOPY WITH RETROGRADE PYELOGRAM;  Surgeon: Valetta Fuller, MD;  Location: [facility];  Service: Urology;  Laterality: Bilateral;  . Cystoscopy with biopsy 03/14/2011    Procedure: CYSTOSCOPY WITH BIOPSY;  Surgeon: Valetta Fuller, MD;  Location: [facility];  Service: Urology;  Laterality: N/A;  c arm   . Cholecystectomy  July 1997- laparoscopic    complications of infection, w/ additional surg's 3 minor and 3  major    Family History  Problem Relation Age of Onset  . Heart disease Mother   . Heart attack Father   . Heart disease Father   . Hypertension Brother     No Known Allergies  Current Outpatient Prescriptions on File Prior to Visit  Medication Sig Dispense Refill  . amLODipine (NORVASC) 5 MG tablet Take 1 tablet (5 mg total) by mouth daily.  90 tablet  3  . Ascorbic Acid (VITAMIN C) 500 MG tablet Take 500 mg by mouth daily.       Marland Kitchen aspirin 81 MG tablet Take 81 mg by mouth daily.       . diphenhydrAMINE (BENADRYL) 25 mg capsule Take 25 mg by mouth at bedtime as needed.       Tery Sanfilippo Sodium (PHILLIPS STOOL SOFTENER PO) Take by mouth daily.       . fish oil-omega-3 fatty acids 1000 MG capsule Take 1 g by mouth daily.       Marland Kitchen HYDROcodone-acetaminophen (LORTAB) 7.5-500 MG per tablet TAKE ONE TABLET BY MOUTH EVERY 4 HOURS IF NEEDED FOR PAIN  100 tablet  2  . lipase/protease/amylase (CREON) 12000 UNITS CPEP Take 2 capsules by mouth 3 (three) times daily before  meals.  270 capsule  3  . lisinopril (PRINIVIL,ZESTRIL) 20 MG tablet Take 1 tablet (20 mg total) by mouth daily.  90 tablet  2  . Multiple Vitamin (MULTIVITAMIN) tablet Take 1 tablet by mouth daily.        Marland Kitchen oxyCODONE-acetaminophen (PERCOCET) 10-325 MG per tablet Take 1 tablet by mouth every 4 (four) hours as needed. In the evening  60 tablet  0  . rosuvastatin (CRESTOR) 10 MG tablet Take 1 tablet (10 mg total) by mouth every evening.  90 tablet  3     patient denies chest pain, shortness of breath, orthopnea. Denies lower extremity edema, abdominal pain, change in appetite, change in bowel movements. Patient denies rashes, musculoskeletal complaints. No other specific complaints in a complete review of systems.   BP 146/84  Pulse 72  Temp(Src) 98.1 F (36.7 C) (Oral)  Ht 6\' 1"  (1.854 m)  Wt 197 lb (89.359 kg)  BMI 25.99 kg/m2  well-developed well-nourished male in no acute distress. HEENT exam atraumatic, normocephalic, neck  supple without jugular venous distention. Chest clear to auscultation cardiac exam S1-S2 are regular. Abdominal exam overweight with bowel sounds, soft and nontender. Extremities no edema. Neurologic exam is alert with a normal gait.

## 2011-09-02 ENCOUNTER — Telehealth: Payer: Self-pay | Admitting: Internal Medicine

## 2011-09-02 MED ORDER — OXYCODONE-ACETAMINOPHEN 10-325 MG PO TABS: 1.0000 | ORAL_TABLET | ORAL | Status: DC | PRN

## 2011-09-02 NOTE — Telephone Encounter (Signed)
Pt called req refill of oxyCODONE-acetaminophen (PERCOCET) 10-325 MG per tablet. Pts refill will be due on 09/04/11, which is on a Sunday. Pt req to pick up script today.

## 2011-09-02 NOTE — Telephone Encounter (Signed)
Pt wanted to know if Dr Cato Mulligan seen the note from Dr Isabel Caprice and wants to know what Dr Cato Mulligan thinks.  He will assume if he doesn't hear anything than its good news, rx will be read for pt to p/u on Monday, pt aware

## 2011-09-04 NOTE — Telephone Encounter (Signed)
ok 

## 2011-10-03 ENCOUNTER — Telehealth: Payer: Self-pay | Admitting: Internal Medicine

## 2011-10-03 MED ORDER — OXYCODONE-ACETAMINOPHEN 10-325 MG PO TABS: 1.0000 | ORAL_TABLET | ORAL | Status: DC | PRN

## 2011-10-03 NOTE — Telephone Encounter (Signed)
rx up front ready for p/u, pt aware 

## 2011-10-03 NOTE — Telephone Encounter (Signed)
Pt req refill of oxyCODONE-acetaminophen (PERCOCET) 10-325 MG per tablet

## 2011-10-31 ENCOUNTER — Telehealth: Payer: Self-pay | Admitting: Internal Medicine

## 2011-10-31 ENCOUNTER — Other Ambulatory Visit: Payer: Self-pay | Admitting: Internal Medicine

## 2011-10-31 NOTE — Telephone Encounter (Signed)
Pt called req to get refill of oxyCODONE-acetaminophen (PERCOCET) 10-325 MG per tablet

## 2011-11-01 ENCOUNTER — Other Ambulatory Visit: Payer: Self-pay | Admitting: Urology

## 2011-11-01 NOTE — Telephone Encounter (Signed)
Pt called to check on status of getting Oxycodone Percocet refilled. Pt knows pcp is out of office. Req to pick up script asap.

## 2011-11-02 ENCOUNTER — Other Ambulatory Visit: Payer: Self-pay | Admitting: Internal Medicine

## 2011-11-02 ENCOUNTER — Encounter (HOSPITAL_BASED_OUTPATIENT_CLINIC_OR_DEPARTMENT_OTHER): Payer: Self-pay | Admitting: *Deleted

## 2011-11-02 MED ORDER — OXYCODONE-ACETAMINOPHEN 10-325 MG PO TABS: 1.0000 | ORAL_TABLET | ORAL | Status: DC | PRN

## 2011-11-02 NOTE — Addendum Note (Signed)
Addended by: Alfred Levins D on: 11/02/2011 10:55 AM   Modules accepted: Orders

## 2011-11-02 NOTE — H&P (Signed)
Reason For Visit   [name] returns today as an urgent walk-in. He has had 10+ days now of ongoing gross hematuria. This appears to be primarily total gross painless hematuria. Again, his previous assessment is outlined in the notes below. The last time he was here urine cytology did show some ongoing atypia. The FISH testing, however, was negative as was NMP22 testing. He has had a hematuria protocol CT scan and has also had an office as well as a cystoscopy under anesthesia. He has also had retrograde pyelograms. He has continued to have intermittent persistent gross hematuria and the etiology has never been clearly established. He has had at least one cytology that showed frankly malignant cells and another that showed some atypia. The patient, again however, has had a repeat cytology which was negative and has had biopsies and bladder washings which were unremarkable. This has been frustrating and concerning that something could be mixed but we were unable to identify a clear problem at this point. Urine today continues to show hematuria without definitive evidence of infection. He has not had a cystoscopy while actively bleeding.      History of Present Illness      Past Gu Hx:  Les came to see me because of gross asymptomatic hematuria.  The patient underwent a CT scan here in our office by hematuria protocol and also cystoscopy and no significant pathology was noted from a urologic perspective.  Again, he was noted to have an increase in size of his abdominal aneurysm and was seen by vascular surgery with appropriate follow-up set up.  The patient's initial urine cytology here in our office was felt to be positive for malignancy.  For that reason we were quite concerned that we might have missed a small abnormality.  For that reason I took him to surgery recently and performed repeat cystoscopy, which again failed to demonstrate any obvious pathology. I also performed bilateral retrogrades.  A  bladder barbotage cytology was sent which showed only some reaction and no evidence of obvious atypia or malignancy.  The patient also had some biopsies done on some areas of erythema around the bladder neck and these also showed just inflammation.  The bottom line is that 2 cystoscopies as well as CT scan, retrograde pyelograms, and repeat cytology have failed to show anything of concern, although, again, he did have an initial positive cytology.  He has no new complaints or issues today.      Past Medical History Problems  1. History of  Arthritis V13.4 2. 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 History of  Hyperlipidemia 272.4 4. History of  Hypertension 401.9 5. History of  Sleep Disturbances 780.50  Surgical History Problems  1. History of  Cystoscopy With Biopsy 2. History of  Gallbladder Surgery 3. History of  Hernia Repair 4. History of  Knee Surgery 5. History of  Wrist Surgery  Current Meds 1. AmLODIPine Besylate 5 MG Oral Tablet; Therapy: (Recorded:26Oct2012) to 2. Aspirin 81 MG Oral Tablet; Therapy: (Recorded:12Apr2013) to 3. Benadryl 25 MG Oral Tablet; 1 tablet every 6 hours as needed; Therapy: (Recorded:26Oct2012) to 4. Benadryl TABS; Therapy: (Recorded:02Jul2013) to 5. Creon 12000 UNIT Oral Capsule Delayed Release Particles; Therapy: (Recorded:02Jul2013) to 6. Creon 12000 UNIT Oral Capsule Delayed Release Particles; Therapy: (Recorded:26Oct2012) to 7. Crestor 10 MG Oral Tablet; Therapy: (Recorded:26Oct2012) to 8. Docusate Sodium 100 MG Oral Capsule; TAKE 1 CAPSULE DAILY; Therapy:  (Recorded:26Oct2012) to 9. Fish Oil Concentrate 1000 MG Oral Capsule; TAKE 1 CAPSULE  DAILY; Therapy:  (Recorded:26Oct2012) to 10. Hydrocodone-Acetaminophen 7.5-500 MG Oral Tablet; Therapy: (Recorded:12Apr2013) to 11. Lisinopril 20 MG Oral Tablet; Therapy: (Recorded:26Oct2012) to 12. Lortab 7.5-500 MG Oral Tablet; Therapy: (Recorded:26Oct2012) to 13. Multi-Vitamin Oral Tablet; Therapy:  (Recorded:12Apr2013) to 14. Percocet 10-325 MG Oral Tablet; Therapy: (Recorded:26Oct2012) to 15. Vitamin C 500 MG Oral Tablet; 1 per day; Therapy: (Recorded:26Oct2012) to  Allergies Medication  1. No Known Drug Allergies  Family History Problems  1. Paternal history of  Cholelithiasis 2. Family history of  Father Deceased At Age ____ 31: Heart Disease 3. Maternal history of  Heart Disease V17.49 4. Paternal history of  Heart Disease V17.49 5. Family history of  Mother Deceased At Age ____ 49: Heart Disease  Social History Problems  1. Caffeine Use 3-4 per day 2. Former Smoker V15.82 Patient started smoking at the age of 51 or 31. He got up to 2 packs per day. Smoked for 49-50 years. In college he would quit smoking at times for 6-7 months for about 5 years. After graduating college he began to smoke daily. Quit smoking 8 years ago in 2004. Denies any other forms of tobacco use. 3. Marital History - Divorced V61.03 4. Occupation: Retired: Geneticist, molecular - [employer] 5. 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 Heavily drinking alcohol in his mid 20's. He quit drinking in 1995.  Review of Systems Genitourinary, constitutional, skin, eye, otolaryngeal, hematologic/lymphatic, cardiovascular, pulmonary, endocrine, musculoskeletal, gastrointestinal, neurological and psychiatric system(s) were reviewed and pertinent findings if present are noted.  Genitourinary: urinary frequency, nocturia and hematuria, but no dysuria.  Gastrointestinal: no flank pain and no abdominal pain.  Musculoskeletal: joint pain.    Vitals Vital Signs [Data Includes: Last 1 Day]  02Jul2013 02:03PM  BMI Calculated: 25.19 BSA Calculated: 2.08 Weight: 188 lb  Blood Pressure: 127 / 71 Temperature: 69 F  Physical Exam Constitutional: Well nourished and well developed . No acute distress.  ENT:. The ears and nose are normal in appearance.  Neck: The appearance of the neck is normal and no  neck mass is present.  Cardiovascular: Heart rate and rhythm are normal . No peripheral edema.  Genitourinary: Examination of the penis demonstrates no discharge, no masses, no lesions and a normal meatus. The scrotum is without lesions. The right epididymis is palpably normal and non-tender. The left epididymis is palpably normal and non-tender. The right testis is non-tender and without masses. The left testis is non-tender and without masses.  Skin: Normal skin turgor, no visible rash and no visible skin lesions.  Neuro/Psych:. Mood and affect are appropriate.    Results/Data Urine [Data Includes: Last 1 Day]   02Jul2013  COLOR BROWN   APPEARANCE CLOUDY   SPECIFIC GRAVITY 1.010   pH 6.5   GLUCOSE NEG mg/dL  BILIRUBIN NEG   KETONE NEG mg/dL  BLOOD LARGE   PROTEIN 30 mg/dL  UROBILINOGEN 0.2 mg/dL  NITRITE NEG   LEUKOCYTE ESTERASE TRACE   SQUAMOUS EPITHELIAL/HPF RARE   WBC 3-6 WBC/hpf  RBC TNTC RBC/hpf  BACTERIA NONE SEEN   CRYSTALS NONE SEEN   CASTS NONE SEEN   Other MUCUS    Procedure  Procedure: Cystoscopy   Indication: Hematuria.  Informed Consent: Risks, benefits, and potential adverse events were discussed and informed consent was obtained from the patient . Specific risks including, but not limited to bleeding, infection, pain, allergic reaction etc. were explained.  Procedure Note:  Urethral meatus:. No abnormalities.  Anterior urethra: No abnormalities.  Prostatic urethra:  No abnormalities . The lateral and median prostatic lobes were enlarged.  Bladder: Visulization was obscured due to blood. The right ureteral orifice had bloody efflux. A systematic survey of the bladder demonstrated no bladder tumors or stones.    Assessment Assessed  1. Gross Hematuria 599.71 2. Benign Prostatic Hypertrophy With Urinary Obstruction 600.01  Plan Gross Hematuria (599.71)  1. Cysto  Done: 02Jul2013 2. HEMOGLOBIN & HEMATOCRIT  Requested for: 02Jul2013 3. Follow-up Schedule  Surgery Office  Follow-up  Requested for: 02Jul2013  Discussion/Summary   [name] has had ongoing intermittent gross hematuria. Again, he has had one cytology that did show what was felt to be malignant cells and another that showed atypia. FISH testing, a repeat bladder washing, and NMP22 cytology however have been negative. He has had a hematuria evaluation CT and several cystoscopies which have failed to show definitive abnormality. On today's cystoscopy urine effluxing from the right side appeared to be grossly bloody. This would localize his hematuria to his right upper tract or at least presumptively so. At this point I do think reassessment with repeat retrograde pyelography bilaterally and more importantly ureteroscopy of the right collecting system is indicated. I would plan on doing urine cytology's and barbotage's of the right collecting system as well as flexible ureteroscopy. I explained to him that he will need a stent for at least a couple of days after the procedure. I would like him to hold off on his aspirin for the time being.     Signatures Electronically signed by : Barron Alvine, M.D.; Nov 01 2011  4:08PM

## 2011-11-02 NOTE — Telephone Encounter (Addendum)
Ok per Dr Lovell Sheehan since Dr Cato Mulligan is out of the office.  Rx up front ready for p/u, pt aware.

## 2011-11-02 NOTE — Progress Notes (Signed)
To [facility] at 1100,Istat on arrival ,Ekg in epic-Npo after mn-will take amlodipine,pain medication  with sip water that am.

## 2011-11-02 NOTE — Telephone Encounter (Addendum)
Pt also wanted Dr Cato Mulligan that he is bleeding from the kidneys and he is going to have a procedure done by Dr Isabel Caprice on Monday.  Pt has d/c ASA.  Removed from med lis

## 2011-11-07 ENCOUNTER — Ambulatory Visit (HOSPITAL_BASED_OUTPATIENT_CLINIC_OR_DEPARTMENT_OTHER): Payer: Medicare Other | Admitting: Anesthesiology

## 2011-11-07 ENCOUNTER — Encounter (HOSPITAL_BASED_OUTPATIENT_CLINIC_OR_DEPARTMENT_OTHER): Payer: Self-pay | Admitting: Anesthesiology

## 2011-11-07 ENCOUNTER — Encounter (HOSPITAL_BASED_OUTPATIENT_CLINIC_OR_DEPARTMENT_OTHER)
Admission: RE | Disposition: A | Discharge status: Home / Self Care | Payer: Self-pay | Source: Ambulatory Visit | Attending: Urology

## 2011-11-07 ENCOUNTER — Encounter (HOSPITAL_BASED_OUTPATIENT_CLINIC_OR_DEPARTMENT_OTHER): Payer: Self-pay | Admitting: *Deleted

## 2011-11-07 ENCOUNTER — Ambulatory Visit (HOSPITAL_BASED_OUTPATIENT_CLINIC_OR_DEPARTMENT_OTHER)
Admission: RE | Admit: 2011-11-07 | Discharge: 2011-11-07 | Disposition: A | Discharge status: Home / Self Care | Payer: Medicare Other | Source: Ambulatory Visit | Attending: Urology | Admitting: Urology

## 2011-11-07 DIAGNOSIS — I10 Essential (primary) hypertension: Secondary | ICD-10-CM | POA: Insufficient documentation

## 2011-11-07 DIAGNOSIS — Z79899 Other long term (current) drug therapy: Secondary | ICD-10-CM | POA: Insufficient documentation

## 2011-11-07 DIAGNOSIS — R310 Gross hematuria: Secondary | ICD-10-CM | POA: Insufficient documentation

## 2011-11-07 DIAGNOSIS — R82998 Other abnormal findings in urine: Secondary | ICD-10-CM | POA: Insufficient documentation

## 2011-11-07 DIAGNOSIS — E785 Hyperlipidemia, unspecified: Secondary | ICD-10-CM | POA: Insufficient documentation

## 2011-11-07 DIAGNOSIS — Z7982 Long term (current) use of aspirin: Secondary | ICD-10-CM | POA: Insufficient documentation

## 2011-11-07 DIAGNOSIS — N138 Other obstructive and reflux uropathy: Secondary | ICD-10-CM | POA: Insufficient documentation

## 2011-11-07 DIAGNOSIS — N401 Enlarged prostate with lower urinary tract symptoms: Secondary | ICD-10-CM | POA: Insufficient documentation

## 2011-11-07 HISTORY — PX: CYSTOSCOPY W/ RETROGRADES: SHX1426

## 2011-11-07 HISTORY — PX: CYSTOSCOPY W/ URETERAL STENT PLACEMENT: SHX1429

## 2011-11-07 LAB — POCT I-STAT 4, (NA,K, GLUC, HGB,HCT)
Glucose, Bld: 99 mg/dL (ref 70–99)
Hemoglobin: 12.6 g/dL — ABNORMAL LOW (ref 13.0–17.0)
Potassium: 4.4 mEq/L (ref 3.5–5.1)

## 2011-11-07 SURGERY — CYSTOSCOPY, WITH RETROGRADE PYELOGRAM
Anesthesia: General | Site: Ureter | Laterality: Right | Wound class: Clean Contaminated

## 2011-11-07 MED ORDER — FENTANYL CITRATE 0.05 MG/ML IJ SOLN: 25.0000 ug | INTRAMUSCULAR | Status: DC | PRN

## 2011-11-07 MED ORDER — MIDAZOLAM HCL 5 MG/5ML IJ SOLN
INTRAMUSCULAR | Status: DC | PRN
  Administered 2011-11-07: 1 mg via INTRAVENOUS

## 2011-11-07 MED ORDER — LACTATED RINGERS IV SOLN: INTRAVENOUS | Status: DC

## 2011-11-07 MED ORDER — LIDOCAINE HCL 2 % EX GEL
CUTANEOUS | Status: DC | PRN
  Administered 2011-11-07: 1

## 2011-11-07 MED ORDER — LACTATED RINGERS IV SOLN
INTRAVENOUS | Status: DC
  Administered 2011-11-07: 100 mL/h via INTRAVENOUS

## 2011-11-07 MED ORDER — CIPROFLOXACIN IN D5W 400 MG/200ML IV SOLN
400.0000 mg | INTRAVENOUS | Status: AC
  Administered 2011-11-07: 400 mg via INTRAVENOUS

## 2011-11-07 MED ORDER — HYDROCODONE-ACETAMINOPHEN 5-325 MG PO TABS: 1.0000 | ORAL_TABLET | Freq: Four times a day (QID) | ORAL | Status: AC | PRN

## 2011-11-07 MED ORDER — PROPOFOL 10 MG/ML IV EMUL
INTRAVENOUS | Status: DC | PRN
  Administered 2011-11-07: 200 mg via INTRAVENOUS

## 2011-11-07 MED ORDER — URIBEL 118 MG PO CAPS: 1.0000 | ORAL_CAPSULE | Freq: Three times a day (TID) | ORAL | Status: DC | PRN

## 2011-11-07 MED ORDER — ONDANSETRON HCL 4 MG/2ML IJ SOLN
INTRAMUSCULAR | Status: DC | PRN
  Administered 2011-11-07: 4 mg via INTRAVENOUS

## 2011-11-07 MED ORDER — LIDOCAINE HCL (CARDIAC) 20 MG/ML IV SOLN
INTRAVENOUS | Status: DC | PRN
  Administered 2011-11-07: 60 mg via INTRAVENOUS

## 2011-11-07 MED ORDER — SODIUM CHLORIDE 0.9 % IR SOLN
Status: DC | PRN
  Administered 2011-11-07: 6000 mL via INTRAVESICAL

## 2011-11-07 MED ORDER — FENTANYL CITRATE 0.05 MG/ML IJ SOLN
INTRAMUSCULAR | Status: DC | PRN
  Administered 2011-11-07: 25 ug via INTRAVENOUS
  Administered 2011-11-07: 50 ug via INTRAVENOUS
  Administered 2011-11-07: 25 ug via INTRAVENOUS

## 2011-11-07 MED ORDER — IOHEXOL 350 MG/ML SOLN
INTRAVENOUS | Status: DC | PRN
  Administered 2011-11-07: 10 mL via INTRAVENOUS

## 2011-11-07 MED ORDER — DEXAMETHASONE SODIUM PHOSPHATE 4 MG/ML IJ SOLN
INTRAMUSCULAR | Status: DC | PRN
  Administered 2011-11-07: 5 mg via INTRAVENOUS

## 2011-11-07 MED ORDER — EPHEDRINE SULFATE 50 MG/ML IJ SOLN
INTRAMUSCULAR | Status: DC | PRN
  Administered 2011-11-07: 10 mg via INTRAVENOUS

## 2011-11-07 SURGICAL SUPPLY — 31 items
ADAPTER CATH URET PLST 4-6FR (CATHETERS) ×3 IMPLANT
BAG DRAIN URO-CYSTO SKYTR STRL (DRAIN) ×3 IMPLANT
BENZOIN TINCTURE PRP APPL 2/3 (GAUZE/BANDAGES/DRESSINGS) ×3 IMPLANT
BRUSH URET BIOPSY 3F (UROLOGICAL SUPPLIES) ×3 IMPLANT
CANISTER SUCT LVC 12 LTR MEDI- (MISCELLANEOUS) ×3 IMPLANT
CATH INTERMIT  6FR 70CM (CATHETERS) IMPLANT
CATH URET 5FR 28IN CONE TIP (BALLOONS)
CATH URET 5FR 28IN OPEN ENDED (CATHETERS) IMPLANT
CATH URET 5FR 70CM CONE TIP (BALLOONS) IMPLANT
CLOTH BEACON ORANGE TIMEOUT ST (SAFETY) ×3 IMPLANT
DRAPE CAMERA CLOSED 9X96 (DRAPES) ×3 IMPLANT
DRSG TEGADERM 2-3/8X2-3/4 SM (GAUZE/BANDAGES/DRESSINGS) ×3 IMPLANT
GLOVE BIO SURGEON STRL SZ7.5 (GLOVE) ×3 IMPLANT
GLOVE ECLIPSE 6.0 STRL STRAW (GLOVE) ×3 IMPLANT
GLOVE INDICATOR 6.5 STRL GRN (GLOVE) ×3 IMPLANT
GOWN STRL REIN XL XLG (GOWN DISPOSABLE) ×3 IMPLANT
GOWN SURGICAL LARGE (GOWNS) ×3 IMPLANT
GUIDEWIRE 0.038 PTFE COATED (WIRE) IMPLANT
GUIDEWIRE ANG ZIPWIRE 038X150 (WIRE) ×3 IMPLANT
GUIDEWIRE STR DUAL SENSOR (WIRE) IMPLANT
KIT BALLIN UROMAX 15FX10 (LABEL) IMPLANT
KIT BALLN UROMAX 15FX4 (MISCELLANEOUS) IMPLANT
KIT BALLN UROMAX 26 75X4 (MISCELLANEOUS)
NS IRRIG 500ML POUR BTL (IV SOLUTION) ×3 IMPLANT
PACK CYSTOSCOPY (CUSTOM PROCEDURE TRAY) ×3 IMPLANT
SET HIGH PRES BAL DIL (LABEL)
SHEATH ACCESS URETERAL 38CM (SHEATH) ×3 IMPLANT
SHEATH URET ACCESS 12FR/35CM (UROLOGICAL SUPPLIES) IMPLANT
SHEATH URET ACCESS 12FR/55CM (UROLOGICAL SUPPLIES) IMPLANT
STENT CONTOUR 7FRX24 (STENTS) ×3 IMPLANT
STENT CONTOUR 7FRX24X.038 (STENTS) IMPLANT

## 2011-11-07 NOTE — Interval H&P Note (Signed)
History and Physical Interval Note:  11/07/2011 11:38 AM  [name]  has presented today for surgery, with the diagnosis of GROSS HEMATURIA  The various methods of treatment have been discussed with the patient and family. After consideration of risks, benefits and other options for treatment, the patient has consented to  Procedure(s) (LRB): CYSTOSCOPY WITH RETROGRADE PYELOGRAM (Bilateral) FLEXIBLE URETEROSCOPY (Right) CYSTOSCOPY WITH STENT REPLACEMENT (Right) as a surgical intervention .  The patient's history has been reviewed, patient examined, no change in status, stable for surgery.  I have reviewed the patients' chart and labs.  Questions were answered to the patient's satisfaction.     Haruko Mersch S

## 2011-11-07 NOTE — Transfer of Care (Signed)
Immediate Anesthesia Transfer of Care Note  Patient: [name]  Procedure(s) Performed: Procedure(s) (LRB): CYSTOSCOPY WITH RETROGRADE PYELOGRAM (Bilateral) FLEXIBLE URETEROSCOPY (Right) CYSTOSCOPY WITH STENT REPLACEMENT (Right)  Patient Location: PACU  Anesthesia Type: General  Level of Consciousness: awake, alert  and oriented  Airway & Oxygen Therapy: Patient Spontanous Breathing and Patient connected to face mask oxygen  Post-op Assessment: Report given to PACU RN and Post -op Vital signs reviewed and stable  Post vital signs: Reviewed and stable  Complications: No apparent anesthesia complications

## 2011-11-07 NOTE — H&P (View-Only) (Signed)
To [facility] at 1100,Istat on arrival ,Ekg in epic-Npo after mn-will take amlodipine,pain medication  with sip water that am. 

## 2011-11-07 NOTE — Op Note (Signed)
Preoperative diagnosis: Gross hematuria and positive urine cytology Postoperative diagnosis: Same. 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  Procedure: Cystoscopy, right retrograde pyelogram, right digital flexible ureteroscopy, right upper tract barbotage for cytology, ureteral biopsy and brushing.   Surgeon: Valetta Fuller M.D.  Anesthesia: Gen.  Indications: Patient is 71 year old male. He has had several months of intermittent gross hematuria. Initial assessment included a CT scan of the abdomen and pelvis by hematuria protocol which was unremarkable. The patient also had office cystoscopy. No significant pathology was noted but his cytology was abnormal. The patient socially was taken to surgery where bilateral retrogrades were unremarkable. Bladder washings were negative at that time. The patient's hematuria resolved but has recently recurred. Office cystoscopy suggested blood coming from the right ureteral orifice. He is here for further assessment asymptomatic lateralizing gross hematuria.     Technique and findings: Patient was brought the operating room where he had successful induction general anesthesia. He was placed in lithotomy position and prepped and draped in usual manner. Patient had placement of PAS compression boots and received perioperative antibiotics. Cystoscopy revealed unremarkable anterior urethra. Prostatic urethra showed mild trilobar hyperplasia with mild visual obstruction. The bladder showed no obvious pathology and was no evidence of active bleeding although the urine in the bladder was initially bloody. One could clearly see reflux of bloody urine from the right ureteral orifice and clear urine from the left side. Attention was turned to retrograde pyelography on the right side. Retrograde pyelogram was performed which showed a normal caliber ureter without filling defect or obstruction. The collecting system itself appeared  unremarkable. He was delicate without evidence of dilation and no evidence of clear filling defect could be appreciated.   Over the guidewire a flexible digital ureteroscope access sheath was placed with fluoroscopic guidance. The digital ureteroscope was then inserted. Upon entering the right renal pelvis was clear that there were multiple areas of abnormal appearing mucosa. While this could be potentially inflammatory it was more consistent with a higher grade transitional cell carcinoma. There were small areas of nodule liver appearance and also some small frondular areas. Representative pictures were taken. Of note after the retrograde pyelogram the open-ended catheter had been inserted to the right renal pelvis and saline washings were done of the right renal pelvis prior to the flexible ureteroscopy.   To the flexible ureteroscope we took several small cold cup biopsies of these areas and also did brushings. Hopefully there will be tissue to establish a definitive diagnosis. There was moderate ongoing bleeding and oozing from this area. Over the guidewire we then placed a 7 French 24 cm double-J stent with a dangle string. Good positioning was confirmed and this was secured to the patient's penis. He was brought to recovery room stable condition having had no obvious complications or problems.

## 2011-11-07 NOTE — Anesthesia Preprocedure Evaluation (Signed)
Anesthesia Evaluation  Patient identified by MRN, date of birth, ID band Patient awake    Reviewed: Allergy & Precautions, H&P , NPO status , Patient's Chart, lab work & pertinent test results  Airway Mallampati: II TM Distance: >3 FB Neck ROM: full    Dental  (+) Caps and Dental Advisory Given Multiple caps and implants front top and bottom.:   Pulmonary neg pulmonary ROS,  breath sounds clear to auscultation  Pulmonary exam normal       Cardiovascular hypertension, Pt. on medications Rhythm:regular Rate:Normal  4.6 cm AAA being followed.  PVD   Neuro/Psych negative neurological ROS  negative psych ROS   GI/Hepatic negative GI ROS, Neg liver ROS, (+)     substance abuse  alcohol use, Alcoholic pancreatitis   Endo/Other  negative endocrine ROS  Renal/GU negative Renal ROS  negative genitourinary   Musculoskeletal   Abdominal   Peds  Hematology negative hematology ROS (+)   Anesthesia Other Findings   Reproductive/Obstetrics negative OB ROS                           Anesthesia Physical Anesthesia Plan  ASA: III  Anesthesia Plan: General   Post-op Pain Management:    Induction: Intravenous  Airway Management Planned: LMA  Additional Equipment:   Intra-op Plan:   Post-operative Plan:   Informed Consent: I have reviewed the patients History and Physical, chart, labs and discussed the procedure including the risks, benefits and alternatives for the proposed anesthesia with the patient or authorized representative who has indicated his/her understanding and acceptance.   Dental Advisory Given  Plan Discussed with: CRNA and Surgeon  Anesthesia Plan Comments:         Anesthesia Quick Evaluation

## 2011-11-07 NOTE — Anesthesia Postprocedure Evaluation (Signed)
  Anesthesia Post-op Note  Patient: [name]  Procedure(s) Performed: Procedure(s) (LRB): CYSTOSCOPY WITH RETROGRADE PYELOGRAM (Bilateral) FLEXIBLE URETEROSCOPY (Right) CYSTOSCOPY WITH STENT REPLACEMENT (Right)  Patient Location: PACU  Anesthesia Type: General  Level of Consciousness: oriented and sedated  Airway and Oxygen Therapy: Patient Spontanous Breathing  Post-op Pain: mild  Post-op Assessment: Post-op Vital signs reviewed, Patient's Cardiovascular Status Stable, Respiratory Function Stable and Patent Airway  Post-op Vital Signs: stable  Complications: No apparent anesthesia complications

## 2011-11-08 ENCOUNTER — Encounter (HOSPITAL_BASED_OUTPATIENT_CLINIC_OR_DEPARTMENT_OTHER): Payer: Self-pay | Admitting: Urology

## 2011-11-25 ENCOUNTER — Telehealth: Payer: Self-pay | Admitting: *Deleted

## 2011-11-25 NOTE — Telephone Encounter (Signed)
Dr [name] aware 

## 2011-11-25 NOTE — Telephone Encounter (Signed)
Pt has been dx with rt kidney cancer and Dr Isabel Caprice is just going to do a nephrectomy and hopefully pt will not have to have any other treatment.  Pt just wanted to make sure that Dr Cato Mulligan knew about it and was getting Dr Ellin Goodie notes

## 2011-11-29 ENCOUNTER — Other Ambulatory Visit (HOSPITAL_COMMUNITY): Payer: Self-pay | Admitting: Urology

## 2011-11-29 ENCOUNTER — Ambulatory Visit (HOSPITAL_COMMUNITY)
Admission: RE | Admit: 2011-11-29 | Discharge: 2011-11-29 | Disposition: A | Discharge status: Home / Self Care | Payer: Medicare Other | Source: Ambulatory Visit | Attending: Urology | Admitting: Urology

## 2011-11-29 ENCOUNTER — Telehealth: Payer: Self-pay | Admitting: Internal Medicine

## 2011-11-29 DIAGNOSIS — M439 Deforming dorsopathy, unspecified: Secondary | ICD-10-CM | POA: Insufficient documentation

## 2011-11-29 DIAGNOSIS — C659 Malignant neoplasm of unspecified renal pelvis: Secondary | ICD-10-CM

## 2011-11-29 NOTE — Telephone Encounter (Signed)
Pt requesting refill on oxyCODONE-acetaminophen (PERCOCET) 10-325 MG per tablet

## 2011-11-30 NOTE — Telephone Encounter (Signed)
OK 

## 2011-11-30 NOTE — Telephone Encounter (Signed)
Dr Cato Mulligan is out of the office, would you ok this for 1 month?

## 2011-12-01 MED ORDER — OXYCODONE-ACETAMINOPHEN 10-325 MG PO TABS: 1.0000 | ORAL_TABLET | ORAL | Status: DC | PRN

## 2011-12-01 NOTE — Telephone Encounter (Signed)
rx up front ready for p/u, pt aware 

## 2011-12-01 NOTE — Telephone Encounter (Signed)
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  Also wanted to know if Dr Cato Mulligan was getting all the reports on it.

## 2011-12-07 ENCOUNTER — Telehealth: Payer: Self-pay | Admitting: *Deleted

## 2011-12-07 NOTE — Telephone Encounter (Signed)
Patient confirmed over the phone the new date and time of the new patient appointment

## 2011-12-08 ENCOUNTER — Other Ambulatory Visit: Payer: Self-pay | Admitting: Oncology

## 2011-12-08 ENCOUNTER — Telehealth: Payer: Self-pay | Admitting: Oncology

## 2011-12-08 DIAGNOSIS — C659 Malignant neoplasm of unspecified renal pelvis: Secondary | ICD-10-CM

## 2011-12-08 NOTE — Telephone Encounter (Signed)
Referred by Dr. Borden Dx-Renal Pelvis Ca  °

## 2011-12-09 ENCOUNTER — Telehealth: Payer: Self-pay | Admitting: *Deleted

## 2011-12-09 ENCOUNTER — Other Ambulatory Visit (HOSPITAL_BASED_OUTPATIENT_CLINIC_OR_DEPARTMENT_OTHER): Payer: Medicare Other | Admitting: Lab

## 2011-12-09 ENCOUNTER — Ambulatory Visit (HOSPITAL_BASED_OUTPATIENT_CLINIC_OR_DEPARTMENT_OTHER): Payer: Medicare Other | Admitting: Oncology

## 2011-12-09 ENCOUNTER — Encounter: Payer: Self-pay | Admitting: *Deleted

## 2011-12-09 ENCOUNTER — Ambulatory Visit: Payer: Medicare Other

## 2011-12-09 VITALS — BP 126/77 | HR 65 | Temp 97.7°F | Resp 18 | Ht 72.5 in | Wt 196.2 lb

## 2011-12-09 DIAGNOSIS — C778 Secondary and unspecified malignant neoplasm of lymph nodes of multiple regions: Secondary | ICD-10-CM

## 2011-12-09 DIAGNOSIS — C659 Malignant neoplasm of unspecified renal pelvis: Secondary | ICD-10-CM

## 2011-12-09 LAB — COMPREHENSIVE METABOLIC PANEL
Albumin: 3.7 g/dL (ref 3.5–5.2)
BUN: 12 mg/dL (ref 6–23)
CO2: 29 mEq/L (ref 19–32)
Glucose, Bld: 116 mg/dL — ABNORMAL HIGH (ref 70–99)
Sodium: 137 mEq/L (ref 135–145)
Total Bilirubin: 0.2 mg/dL — ABNORMAL LOW (ref 0.3–1.2)
Total Protein: 7.4 g/dL (ref 6.0–8.3)

## 2011-12-09 LAB — CBC WITH DIFFERENTIAL/PLATELET
Basophils Absolute: 0.1 10*3/uL (ref 0.0–0.1)
Eosinophils Absolute: 0.1 10*3/uL (ref 0.0–0.5)
HCT: 34.5 % — ABNORMAL LOW (ref 38.4–49.9)
HGB: 11.3 g/dL — ABNORMAL LOW (ref 13.0–17.1)
LYMPH%: 11.1 % — ABNORMAL LOW (ref 14.0–49.0)
MONO#: 0.5 10*3/uL (ref 0.1–0.9)
NEUT#: 6.3 10*3/uL (ref 1.5–6.5)
Platelets: 261 10*3/uL (ref 140–400)
RBC: 3.58 10*6/uL — ABNORMAL LOW (ref 4.20–5.82)
WBC: 7.8 10*3/uL (ref 4.0–10.3)

## 2011-12-09 NOTE — Progress Notes (Signed)
CC:   Bruce H. [name], MD Heloise Purpura, MD  REASON FOR CONSULTATION:  Renal pelvis cancer.  HISTORY OF PRESENT ILLNESS:  Mr. Cline is a pleasant, rather healthy 71- year-old gentleman, a native of [place], of multiple locations, currently of [place].  He is retired.  He worked in a Theatre stage manager, [employer], and had multiple occupations in the past.  He is a gentleman with past medical history of hypertension, hyperlipidemia, chronic pancreatitis and developed initially hematuria back in October of 2012. At that time he did not have any problems with abdominal pain or discomfort.  He did have a cystoscopy that was unrevealing.  The patient subsequently had a CT scan as well which showed a renal calculus and AAA but otherwise really no other problems.  Subsequently he continued to follow with urology.  However, most recently developed more gross persistent hematuria on 2 consecutive days in July of 2013.  Flexible cystoscopy was done in the office, showed a lateralization of the hematuria from the upper ureter and subsequently underwent a cystoscopy and a ureteroscopy which revealed endoscopic findings consistent with a flat probably high-grade urothelial carcinomatous lesion on the right side.  The pathology from that on July 8 showed malignant cells consistent with transitional cell carcinoma.  That was case number [id number].  Staging workup at that time including abdomen and pelvis showed that he had evidence of infiltrative mass in the upper pole of the right renal collecting system consistent with a primary urothelial tumor.  He had extensive retroperitoneal nodal metastasis and chronic finding of pancreatitis.  CT scan of the chest showed no evidence of any metastatic disease in the chest.  The patient was referred to me for evaluation for systemic therapy.  The patient clinically feels well.  He is really asymptomatic at this point, not reporting any hematuria, not  reporting any chest pains, not reporting any difficulty in breathing at this time. He is not reporting any back pain, flank pain, shoulder pain.  He has excellent performance status.  Continues to perform activities of daily living without any hindrance or decline.  REVIEW OF SYSTEMS:  He does not report any headaches, blurry vision, double vision.  Does not report any motor or sensory neuropathy.  Did not report any alteration in mental status.  Does not report any psychiatric issues, depression.  Did not report any fever, chills, sweats.  Does not report any cough, hemoptysis, hematemesis.  No nausea or vomiting.  No abdominal pain.  No hematochezia, melena, genitourinary complaints other than what is mentioned in history of present illness. Rest of review of systems unremarkable.  PAST MEDICAL HISTORY:  Significant for abdominal aortic aneurysm, history of pancreatitis, history of hypertension, hyperlipidemia, peripheral vascular disease, history of insomnia.  Status post gallbladder surgery, hernia repair, knee operations as well as had the cystoscopies as mentioned.  MEDICATIONS:  He is currently on amlodipine, vitamin C, Benadryl. Lortab, Creon, Prinivil, Percocet, Crestor and senna.  SOCIAL HISTORY:  He is married.  He has no children.  Stopped smoking in 2004.  He also quit drinking in 1995.  He is currently retired.  FAMILY HISTORY:  Unremarkable for any malignancies.  Family history is significant for coronary disease.  PHYSICAL EXAMINATION:  Alert, awake gentleman, appeared in no active distress.  His blood pressure is 126/77, pulse is 65, respiration 18, temperature is 97.  He is normocephalic, atraumatic.  Pupils equal, round, reactive to light.  Oral mucosa moist and pink.  Neck supple.  No  lymphadenopathy.  Heart is regular rate and rhythm.  S1, S2.  Lungs clear to auscultation.  No rhonchi, wheeze, dullness to percussion. Abdomen is soft, nontender.  No  hepatosplenomegaly.  Extremities had no clubbing, cyanosis or edema.  Neurologically intact motor and sensory and deep tendon reflexes.  LABORATORY DATA:  Showed a hemoglobin of 11.3, white cell count 7.8, platelet count of 261.  His creatinine is 1.0 with a creatinine clearance of about 75 mL/minute.  He had normal liver function tests and electrolytes.  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  He has evidence of stage IV disease with extensive retroperitoneal nodal metastasis with the index lesion measuring 1.5 x 1.9 and aortic caval lymph node measuring 1.5 x 1.8.  I had a long discussion today with Mr. Pettijohn discussing the natural course of this particular disease and the treatment options.  I do agree with Dr. Laverle Patter that systemic therapy would be the way to go at this time.  If he has a complete response then surgical resection would be the 2nd step.  The deleterious effects of administration of chemotherapy discussed today in details.  Combination of Gemzar and cisplatin would be the regimen at this time.  Complication includes nausea, vomiting, myelosuppression, infusion related toxicity, renal dysfunction as well as peripheral neuropathy, hair loss.  Neutropenia and neutropenic sepsis and possible need for growth factor support, transfusion also discussed today.  I also discussed with him a possibility of enrolment of a clinical trial.  I have offered him a [id number] a regimen which is a randomized trial utilizing Gemzar and cisplatin plus or minus Avastin. He is interested and I will have the study coordinator discuss the logistics of this study and either way we will get him started in the near future.  I have also talked about the logistics of a Port-A-Cath placement which is down the line we would consider if his peripheral veins do not cooperate.  All his  questions were answered today.  We will set him up for followup depending on the start of his chemotherapy.    ______________________________ Benjiman Core, M.D. FNS/MEDQ  D:  12/09/2011  T:  12/09/2011  Job:  [id number]

## 2011-12-09 NOTE — Telephone Encounter (Signed)
Gave patient appointment for chemo class on 12-13-2011 at 5:00pm printed out calendar and gave to the patient

## 2011-12-09 NOTE — Progress Notes (Signed)
Note dictated

## 2011-12-12 ENCOUNTER — Other Ambulatory Visit (HOSPITAL_COMMUNITY): Payer: Self-pay | Admitting: Urology

## 2011-12-12 DIAGNOSIS — K769 Liver disease, unspecified: Secondary | ICD-10-CM

## 2011-12-13 ENCOUNTER — Encounter: Payer: Self-pay | Admitting: *Deleted

## 2011-12-13 ENCOUNTER — Ambulatory Visit (HOSPITAL_COMMUNITY)
Admission: RE | Admit: 2011-12-13 | Discharge: 2011-12-13 | Disposition: A | Discharge status: Home / Self Care | Payer: Medicare Other | Source: Ambulatory Visit | Attending: Urology | Admitting: Urology

## 2011-12-13 ENCOUNTER — Other Ambulatory Visit: Payer: Self-pay | Admitting: Oncology

## 2011-12-13 ENCOUNTER — Telehealth: Payer: Self-pay | Admitting: Oncology

## 2011-12-13 ENCOUNTER — Other Ambulatory Visit: Payer: Medicare Other

## 2011-12-13 DIAGNOSIS — N289 Disorder of kidney and ureter, unspecified: Secondary | ICD-10-CM | POA: Insufficient documentation

## 2011-12-13 DIAGNOSIS — N281 Cyst of kidney, acquired: Secondary | ICD-10-CM | POA: Insufficient documentation

## 2011-12-13 DIAGNOSIS — C679 Malignant neoplasm of bladder, unspecified: Secondary | ICD-10-CM | POA: Insufficient documentation

## 2011-12-13 DIAGNOSIS — K8689 Other specified diseases of pancreas: Secondary | ICD-10-CM | POA: Insufficient documentation

## 2011-12-13 DIAGNOSIS — I7409 Other arterial embolism and thrombosis of abdominal aorta: Secondary | ICD-10-CM | POA: Insufficient documentation

## 2011-12-13 DIAGNOSIS — I714 Abdominal aortic aneurysm, without rupture, unspecified: Secondary | ICD-10-CM | POA: Insufficient documentation

## 2011-12-13 DIAGNOSIS — I517 Cardiomegaly: Secondary | ICD-10-CM | POA: Insufficient documentation

## 2011-12-13 DIAGNOSIS — D3500 Benign neoplasm of unspecified adrenal gland: Secondary | ICD-10-CM | POA: Insufficient documentation

## 2011-12-13 DIAGNOSIS — K7689 Other specified diseases of liver: Secondary | ICD-10-CM | POA: Insufficient documentation

## 2011-12-13 DIAGNOSIS — C779 Secondary and unspecified malignant neoplasm of lymph node, unspecified: Secondary | ICD-10-CM | POA: Insufficient documentation

## 2011-12-13 DIAGNOSIS — K769 Liver disease, unspecified: Secondary | ICD-10-CM

## 2011-12-13 DIAGNOSIS — C649 Malignant neoplasm of unspecified kidney, except renal pelvis: Secondary | ICD-10-CM | POA: Insufficient documentation

## 2011-12-13 MED ORDER — PROCHLORPERAZINE MALEATE 10 MG PO TABS: 10.0000 mg | ORAL_TABLET | Freq: Four times a day (QID) | ORAL | Status: DC | PRN

## 2011-12-13 MED ORDER — ONDANSETRON HCL 8 MG PO TABS: 8.0000 mg | ORAL_TABLET | Freq: Three times a day (TID) | ORAL | Status: DC | PRN

## 2011-12-13 MED ORDER — GADOBENATE DIMEGLUMINE 529 MG/ML IV SOLN
18.0000 mL | Freq: Once | INTRAVENOUS | Status: AC | PRN
  Administered 2011-12-13: 18 mL via INTRAVENOUS

## 2011-12-13 NOTE — Telephone Encounter (Signed)
appt schedule for August/September was taking to [name] (chemo ed) pt in 5pm chemo ed class.

## 2011-12-14 ENCOUNTER — Telehealth: Payer: Self-pay | Admitting: *Deleted

## 2011-12-14 ENCOUNTER — Encounter: Payer: Self-pay | Admitting: Internal Medicine

## 2011-12-14 ENCOUNTER — Ambulatory Visit (INDEPENDENT_AMBULATORY_CARE_PROVIDER_SITE_OTHER): Payer: Medicare Other | Admitting: Internal Medicine

## 2011-12-14 VITALS — BP 146/84 | Temp 98.2°F | Wt 195.0 lb

## 2011-12-14 DIAGNOSIS — K859 Acute pancreatitis without necrosis or infection, unspecified: Secondary | ICD-10-CM

## 2011-12-14 DIAGNOSIS — C779 Secondary and unspecified malignant neoplasm of lymph node, unspecified: Secondary | ICD-10-CM

## 2011-12-14 DIAGNOSIS — C801 Malignant (primary) neoplasm, unspecified: Secondary | ICD-10-CM

## 2011-12-14 DIAGNOSIS — C7910 Secondary malignant neoplasm of unspecified urinary organs: Secondary | ICD-10-CM | POA: Insufficient documentation

## 2011-12-14 DIAGNOSIS — C649 Malignant neoplasm of unspecified kidney, except renal pelvis: Secondary | ICD-10-CM

## 2011-12-14 MED ORDER — OXYCODONE-ACETAMINOPHEN 10-325 MG PO TABS: 1.0000 | ORAL_TABLET | Freq: Four times a day (QID) | ORAL | Status: DC | PRN

## 2011-12-14 MED ORDER — PANCRELIPASE (LIP-PROT-AMYL) 12000-38000 UNITS PO CPEP: 2.0000 | ORAL_CAPSULE | Freq: Three times a day (TID) | ORAL | Status: DC

## 2011-12-14 MED ORDER — LORAZEPAM 1 MG PO TABS: 0.5000 mg | ORAL_TABLET | Freq: Every evening | ORAL | Status: AC | PRN

## 2011-12-14 MED ORDER — HYDROCODONE-ACETAMINOPHEN 7.5-500 MG PO TABS: 1.0000 | ORAL_TABLET | Freq: Four times a day (QID) | ORAL | Status: DC | PRN

## 2011-12-14 NOTE — Progress Notes (Signed)
Patient ID: [name], male   DOB: 12/26/1940, 71 y.o.   MRN: [id number] New dx metastatic renal cell ca-- see dictated note by dr. Milinda Cave.  Pt with chronic pancreatitis-- controlled on prn meds   recent uri with fever--- sxs resolved  Feels that he is more constipated- if no bm in 3 days he will take ex-lax  Past Medical History  Diagnosis Date  . Hx of colonic polyps   . Diverticulosis   . Hyperlipidemia   . AAA (abdominal aortic aneurysm) last ct 03-02-11    4.6cm  per note from dr fields w/ chart  . Substance abuse hx alcohol abuse  . Alcohol-induced chronic pancreatitis   . Hypertension stress test 1996- states wnl  . Peripheral vascular disease     occlusion left external iliac artery   . Gross hematuria     freq/ urge/ nocturia  . Arthritis     knees  . Peripheral arterial disease     History   Social History  . Marital Status: Divorced    Spouse Name: N/A    Number of Children: N/A  . Years of Education: N/A   Occupational History  . Not on file.   Social History Main Topics  . Smoking status: Former Smoker    Quit date: 03/11/2003  . Smokeless tobacco: Never Used  . Alcohol Use: No     hx alcohol abuse-- quit 1995  . Drug Use: No  . Sexually Active:    Other Topics Concern  . Not on file   Social History Narrative  . No narrative on file    Past Surgical History  Procedure Date  . Knee surgery 1961    left  . Wrist fracture surgery 2010    orif left wrist  . Cataract extraction w/ intraocular lens  implant, bilateral 1998  . Hernia repair 2010    Left inguinal herniorraphy with mesh  . Cystoscopy w/ retrogrades 03/14/2011    Procedure: CYSTOSCOPY WITH RETROGRADE PYELOGRAM;  Surgeon: Valetta Fuller, MD;  Location: [facility];  Service: Urology;  Laterality: Bilateral;  . Cystoscopy with biopsy 03/14/2011    Procedure: CYSTOSCOPY WITH BIOPSY;  Surgeon: Valetta Fuller, MD;  Location: [facility];  Service:  Urology;  Laterality: N/A;  c arm   . Cholecystectomy  July 1997- laparoscopic    complications of infection, w/ additional surg's 3 minor and 3 major  . Cystoscopy w/ retrogrades 11/07/2011    Procedure: CYSTOSCOPY WITH RETROGRADE PYELOGRAM;  Surgeon: Valetta Fuller, MD;  Location: [facility];  Service: Urology;  Laterality: Bilateral;  CYSTOSCOPY, (B) RETROGRADE PYELOGRAM, RIGHT FLEXIBLE URETEROSCOPY, RIGHT JJ STENT    . Cystoscopy w/ ureteral stent placement 11/07/2011    Procedure: CYSTOSCOPY WITH STENT REPLACEMENT;  Surgeon: Valetta Fuller, MD;  Location: [facility];  Service: Urology;  Laterality: Right;    Family History  Problem Relation Age of Onset  . Heart disease Mother   . Heart attack Father   . Heart disease Father   . Hypertension Brother     No Known Allergies  Current Outpatient Prescriptions on File Prior to Visit  Medication Sig Dispense Refill  . amLODipine (NORVASC) 5 MG tablet TAKE 1 TABLET (5 MG TOTAL) BY MOUTH DAILY  90 tablet  2  . Ascorbic Acid (VITAMIN C) 500 MG tablet Take 500 mg by mouth daily.       . diphenhydrAMINE (BENADRYL) 25 mg capsule  Take 75 mg by mouth at bedtime as needed.       Tery Sanfilippo Sodium (PHILLIPS STOOL SOFTENER PO) Take by mouth daily.       Marland Kitchen HYDROcodone-acetaminophen (LORTAB) 7.5-500 MG per tablet TAKE ONE TABLET BY MOUTH EVERY 4 HOURS IF NEEDED FOR PAIN  100 tablet  2  . lisinopril (PRINIVIL,ZESTRIL) 20 MG tablet Take 1 tablet (20 mg total) by mouth daily.  90 tablet  2  . oxyCODONE-acetaminophen (PERCOCET) 10-325 MG per tablet Take 1 tablet by mouth every 4 (four) hours as needed. In the evening  60 tablet  0  . rosuvastatin (CRESTOR) 10 MG tablet Take 1 tablet (10 mg total) by mouth every evening.  90 tablet  3  . Sennosides (HCA LAX-X PO) Take by mouth.         patient denies chest pain, shortness of breath, orthopnea. Denies lower extremity edema, abdominal pain, change in appetite, change in bowel  movements. Patient denies rashes, musculoskeletal complaints. No other specific complaints in a complete review of systems.   BP 146/84  Temp 98.2 F (36.8 C) (Oral)  Wt 195 lb (88.451 kg)  well-developed well-nourished male in no acute distress. HEENT exam atraumatic, normocephalic, neck supple without jugular venous distention. Chest clear to auscultation cardiac exam S1-S2 are regular. Abdominal exam overweight with bowel sounds, soft and nontender. Extremities no edema. Neurologic exam is alert with a normal gait.

## 2011-12-14 NOTE — Assessment & Plan Note (Signed)
Has f/u with oncology

## 2011-12-14 NOTE — Telephone Encounter (Signed)
Left message for patient

## 2011-12-15 ENCOUNTER — Telehealth: Payer: Self-pay | Admitting: *Deleted

## 2011-12-15 NOTE — Telephone Encounter (Signed)
No additional note

## 2011-12-16 ENCOUNTER — Ambulatory Visit (HOSPITAL_BASED_OUTPATIENT_CLINIC_OR_DEPARTMENT_OTHER): Payer: Medicare Other

## 2011-12-16 ENCOUNTER — Other Ambulatory Visit (HOSPITAL_BASED_OUTPATIENT_CLINIC_OR_DEPARTMENT_OTHER): Payer: Medicare Other

## 2011-12-16 VITALS — BP 149/82 | HR 58 | Temp 97.7°F | Resp 18

## 2011-12-16 DIAGNOSIS — C659 Malignant neoplasm of unspecified renal pelvis: Secondary | ICD-10-CM

## 2011-12-16 DIAGNOSIS — C778 Secondary and unspecified malignant neoplasm of lymph nodes of multiple regions: Secondary | ICD-10-CM

## 2011-12-16 DIAGNOSIS — Z5111 Encounter for antineoplastic chemotherapy: Secondary | ICD-10-CM

## 2011-12-16 DIAGNOSIS — C679 Malignant neoplasm of bladder, unspecified: Secondary | ICD-10-CM

## 2011-12-16 LAB — CBC WITH DIFFERENTIAL/PLATELET
Basophils Absolute: 0 10*3/uL (ref 0.0–0.1)
EOS%: 3.2 % (ref 0.0–7.0)
Eosinophils Absolute: 0.3 10*3/uL (ref 0.0–0.5)
HCT: 33.2 % — ABNORMAL LOW (ref 38.4–49.9)
HGB: 11.2 g/dL — ABNORMAL LOW (ref 13.0–17.1)
MCH: 31.4 pg (ref 27.2–33.4)
MCV: 93 fL (ref 79.3–98.0)
NEUT%: 74.8 % (ref 39.0–75.0)
lymph#: 1.3 10*3/uL (ref 0.9–3.3)

## 2011-12-16 LAB — COMPREHENSIVE METABOLIC PANEL
AST: 22 U/L (ref 0–37)
Albumin: 3.9 g/dL (ref 3.5–5.2)
Alkaline Phosphatase: 40 U/L (ref 39–117)
Potassium: 4.5 mEq/L (ref 3.5–5.3)
Sodium: 136 mEq/L (ref 135–145)
Total Protein: 7.3 g/dL (ref 6.0–8.3)

## 2011-12-16 MED ORDER — POTASSIUM CHLORIDE 2 MEQ/ML IV SOLN
Freq: Once | INTRAVENOUS | Status: AC
  Administered 2011-12-16: 11:00:00 via INTRAVENOUS
  Filled 2011-12-16: qty 10

## 2011-12-16 MED ORDER — DEXAMETHASONE SODIUM PHOSPHATE 4 MG/ML IJ SOLN
12.0000 mg | Freq: Once | INTRAMUSCULAR | Status: AC
  Administered 2011-12-16: 10:00:00 via INTRAVENOUS

## 2011-12-16 MED ORDER — SODIUM CHLORIDE 0.9 % IV SOLN
1000.0000 mg/m2 | Freq: Once | INTRAVENOUS | Status: AC
  Administered 2011-12-16: 2128 mg via INTRAVENOUS
  Filled 2011-12-16: qty 56

## 2011-12-16 MED ORDER — SODIUM CHLORIDE 0.9 % IV SOLN
INTRAVENOUS | Status: DC
  Administered 2011-12-16: 10:00:00 via INTRAVENOUS

## 2011-12-16 MED ORDER — PALONOSETRON HCL INJECTION 0.25 MG/5ML
0.2500 mg | Freq: Once | INTRAVENOUS | Status: AC
  Administered 2011-12-16: 0.25 mg via INTRAVENOUS

## 2011-12-16 MED ORDER — PROCHLORPERAZINE MALEATE 10 MG PO TABS
10.0000 mg | ORAL_TABLET | Freq: Once | ORAL | Status: AC
  Administered 2011-12-16: 10 mg via ORAL

## 2011-12-16 MED ORDER — SODIUM CHLORIDE 0.9 % IV SOLN
75.0000 mg/m2 | Freq: Once | INTRAVENOUS | Status: AC
  Administered 2011-12-16: 160 mg via INTRAVENOUS
  Filled 2011-12-16: qty 160

## 2011-12-16 MED ORDER — SODIUM CHLORIDE 0.9 % IV SOLN
150.0000 mg | Freq: Once | INTRAVENOUS | Status: AC
  Administered 2011-12-16: 150 mg via INTRAVENOUS
  Filled 2011-12-16: qty 5

## 2011-12-16 NOTE — Patient Instructions (Addendum)
[facility] Discharge Instructions for Patients Receiving Chemotherapy  Today you received the following chemotherapy agents  Gemzar/ cisplatin  To help prevent nausea and vomiting after your treatment, we encourage you to take your nausea medication     and take it as often as prescribed   If you develop nausea and vomiting that is not controlled by your nausea medication, call the clinic. If it is after clinic hours your family physician or the after hours number for the clinic or go to the Emergency Department.   BELOW ARE SYMPTOMS THAT SHOULD BE REPORTED IMMEDIATELY:  *FEVER GREATER THAN 100.5 F  *CHILLS WITH OR WITHOUT FEVER  NAUSEA AND VOMITING THAT IS NOT CONTROLLED WITH YOUR NAUSEA MEDICATION  *UNUSUAL SHORTNESS OF BREATH  *UNUSUAL BRUISING OR BLEEDING  TENDERNESS IN MOUTH AND THROAT WITH OR WITHOUT PRESENCE OF ULCERS  *URINARY PROBLEMS  *BOWEL PROBLEMS  UNUSUAL RASH Items with * indicate a potential emergency and should be followed up as soon as possible.  One of the nurses will contact you 24 hours after your treatment. Please let the nurse know about any problems that you may have experienced. Feel free to call the clinic you have any questions or concerns. The clinic phone number is [phone].   I have been informed and understand all the instructions given to me. I know to contact the clinic, my physician, or go to the Emergency Department if any problems should occur. I do not have any questions at this time, but understand that I may call the clinic during office hours   should I have any questions or need assistance in obtaining follow up care.    __________________________________________  _____________  __________ Signature of Patient or Authorized Representative            Date                   Time    __________________________________________ Nurse's Signature

## 2011-12-17 NOTE — Assessment & Plan Note (Signed)
Chronic pain Have refilled meds discssed longer acting meds Will stay on same for now

## 2011-12-20 ENCOUNTER — Encounter: Payer: Self-pay | Admitting: Cardiovascular Disease

## 2011-12-20 ENCOUNTER — Ambulatory Visit (INDEPENDENT_AMBULATORY_CARE_PROVIDER_SITE_OTHER): Payer: Medicare Other | Admitting: Cardiovascular Disease

## 2011-12-20 VITALS — BP 113/74 | HR 70 | Ht 72.0 in | Wt 188.0 lb

## 2011-12-20 DIAGNOSIS — I10 Essential (primary) hypertension: Secondary | ICD-10-CM

## 2011-12-20 DIAGNOSIS — Z01810 Encounter for preprocedural cardiovascular examination: Secondary | ICD-10-CM

## 2011-12-20 NOTE — Progress Notes (Signed)
History of Present Illness: 71 yo male with history of HLD, AAA, alcohol induced pancreatitis, HTN, PAD who is here today for cardiac risk assessment prior to planned surgical procedure. He is followed by Dr. Isabel [name] and Dr. Laverle [name] with [facility] for apparent high grade 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 He was started on chemotherapy 12/16/11. He tells me that he has not had a prior cardiac issue. He is very limited because of his PAD. He does not describe chest pressure or pain but he has been dizzy since starting chemotherapy. His breathing has been at baseline.   Primary Care Physician: [name]   Past Medical History  Diagnosis Date  . Hx of colonic polyps   . Diverticulosis   . Hyperlipidemia   . AAA (abdominal aortic aneurysm) last ct 03-02-11    4.6cm  per note from dr fields w/ chart  . Substance abuse hx alcohol abuse  . Alcohol-induced chronic pancreatitis   . Hypertension stress test 1996- states wnl  . Peripheral vascular disease     occlusion left external iliac artery   . Gross hematuria     freq/ urge/ nocturia  . Arthritis     knees  . Peripheral arterial disease     Past Surgical History  Procedure Date  . Knee surgery 1961    left  . Wrist fracture surgery 2010    orif left wrist  . Cataract extraction w/ intraocular lens  implant, bilateral 1998  . Hernia repair 2010    Left inguinal herniorraphy with mesh  . Cystoscopy w/ retrogrades 03/14/2011    Procedure: CYSTOSCOPY WITH RETROGRADE PYELOGRAM;  Surgeon: Valetta Fuller, MD;  Location: [facility];  Service: Urology;  Laterality: Bilateral;  . Cystoscopy with biopsy 03/14/2011    Procedure: CYSTOSCOPY WITH BIOPSY;  Surgeon: Valetta Fuller, MD;  Location: [facility];  Service: Urology;  Laterality: N/A;  c arm   . Cholecystectomy  July 1997- laparoscopic    complications of infection, w/ additional surg's 3 minor and 3 major  . Cystoscopy w/  retrogrades 11/07/2011    Procedure: CYSTOSCOPY WITH RETROGRADE PYELOGRAM;  Surgeon: Valetta Fuller, MD;  Location: [facility];  Service: Urology;  Laterality: Bilateral;  CYSTOSCOPY, (B) RETROGRADE PYELOGRAM, RIGHT FLEXIBLE URETEROSCOPY, RIGHT JJ STENT    . Cystoscopy w/ ureteral stent placement 11/07/2011    Procedure: CYSTOSCOPY WITH STENT REPLACEMENT;  Surgeon: Valetta Fuller, MD;  [facility];  Service: Urology;  Laterality: Right;    Current Outpatient Prescriptions  Medication Sig Dispense Refill  . amLODipine (NORVASC) 5 MG tablet TAKE 1 TABLET (5 MG TOTAL) BY MOUTH DAILY  90 tablet  2  . Ascorbic Acid (VITAMIN C) 500 MG tablet Take 500 mg by mouth daily.       Tery Sanfilippo Sodium (PHILLIPS STOOL SOFTENER PO) Take by mouth daily.       Marland Kitchen HYDROcodone-acetaminophen (LORTAB) 7.5-500 MG per tablet Take 1 tablet by mouth every 6 (six) hours as needed for pain.  100 tablet  5  . lipase/protease/amylase (CREON) 12000 UNITS CPEP Take 2 capsules by mouth 3 (three) times daily before meals.  270 capsule  3  . lisinopril (PRINIVIL,ZESTRIL) 20 MG tablet Take 1 tablet (20 mg total) by mouth daily.  90 tablet  2  . LORazepam (ATIVAN) 1 MG tablet Take 0.5-1 tablets (0.5-1 mg total) by mouth at bedtime as needed (sleep).  30 tablet  0  .  oxyCODONE-acetaminophen (PERCOCET) 10-325 MG per tablet Take 1 tablet by mouth every 6 (six) hours as needed for pain. In the evening  100 tablet  0  . rosuvastatin (CRESTOR) 10 MG tablet Take 1 tablet (10 mg total) by mouth every evening.  90 tablet  3  . Sennosides (HCA LAX-X PO) Take by mouth.        No Known Allergies  History   Social History  . Marital Status: Divorced    Spouse Name: N/A    Number of Children: 1  . Years of Education: N/A   Occupational History  . Retired-[employer]    Social History Main Topics  . Smoking status: Former Smoker -- 2.0 packs/day for 50 years    Types: Cigarettes    Quit date:  03/11/2003  . Smokeless tobacco: Never Used  . Alcohol Use: No     hx alcohol abuse-- quit 1995  . Drug Use: No  . Sexually Active: Not on file   Other Topics Concern  . Not on file   Social History Narrative  . No narrative on file    Family History  Problem Relation Age of Onset  . Heart disease Mother 42  . Heart attack Father 63  . Heart disease Father   . Hypertension Brother     Review of Systems:  As stated in the HPI and otherwise negative.   BP 113/74  Pulse 70  Ht 6' (1.829 m)  Wt 188 lb (85.276 kg)  BMI 25.50 kg/m2  Physical Examination: General: Well developed, well nourished, NAD HEENT: OP clear, mucus membranes moist SKIN: warm, dry. No rashes. Neuro: No focal deficits Musculoskeletal: Muscle strength 5/5 all ext Psychiatric: Mood and affect normal Neck: No JVD, no carotid bruits, no thyromegaly, no lymphadenopathy. Lungs:Clear bilaterally, no wheezes, rhonci, crackles Cardiovascular: Regular rate and rhythm. Slight systolic murmurs. No gallops or rubs. Abdomen:Soft. Bowel sounds present. Non-tender.  Extremities: No lower extremity edema. Pulses are 1-2 + in the bilateral DP/PT.  EKG:NSR, rate 62 bpm. RBBB.   Assessment and Plan:   1. Pre-operative cardiovascular examination: He has no chest pain but he is very limited in his level of exertion by chest pain. He has multiple risk factors for CAD including former tobacco abuse, HTN, HLD, strong FH of CAD, age and known PAD. Will arrange Lexiscan myoview to risk stratify and echo to exclude structural heart disease. I will see him back next week and review and make formal recs for surgery at that time.

## 2011-12-20 NOTE — Patient Instructions (Addendum)
Your physician recommends that you schedule a follow-up appointment on December 28, 2011 at 9:15 with Dr. Clifton James  Your physician has requested that you have an echocardiogram. Echocardiography is a painless test that uses sound waves to create images of your heart. It provides your doctor with information about the size and shape of your heart and how well your heart's chambers and valves are working. This procedure takes approximately one hour. There are no restrictions for this procedure.   Your physician has requested that you have a lexiscan myoview. For further information please visit [email or web address] Please follow instruction sheet, as given.

## 2011-12-21 ENCOUNTER — Ambulatory Visit (HOSPITAL_COMMUNITY): Payer: Medicare Other | Attending: Cardiology | Admitting: Radiology

## 2011-12-21 DIAGNOSIS — I428 Other cardiomyopathies: Secondary | ICD-10-CM | POA: Insufficient documentation

## 2011-12-21 DIAGNOSIS — Z01810 Encounter for preprocedural cardiovascular examination: Secondary | ICD-10-CM

## 2011-12-21 DIAGNOSIS — C679 Malignant neoplasm of bladder, unspecified: Secondary | ICD-10-CM | POA: Insufficient documentation

## 2011-12-21 DIAGNOSIS — I739 Peripheral vascular disease, unspecified: Secondary | ICD-10-CM | POA: Insufficient documentation

## 2011-12-21 DIAGNOSIS — I379 Nonrheumatic pulmonary valve disorder, unspecified: Secondary | ICD-10-CM | POA: Insufficient documentation

## 2011-12-21 DIAGNOSIS — I10 Essential (primary) hypertension: Secondary | ICD-10-CM | POA: Insufficient documentation

## 2011-12-21 DIAGNOSIS — I079 Rheumatic tricuspid valve disease, unspecified: Secondary | ICD-10-CM | POA: Insufficient documentation

## 2011-12-21 DIAGNOSIS — I080 Rheumatic disorders of both mitral and aortic valves: Secondary | ICD-10-CM | POA: Insufficient documentation

## 2011-12-21 MED ORDER — GADOBENATE DIMEGLUMINE 529 MG/ML IV SOLN
18.0000 mL | Freq: Once | INTRAVENOUS | Status: AC | PRN
  Administered 2011-12-21: 18 mL via INTRAVENOUS

## 2011-12-21 NOTE — Progress Notes (Signed)
Echocardiogram performed.  

## 2011-12-22 ENCOUNTER — Encounter: Payer: Self-pay | Admitting: Oncology

## 2011-12-22 ENCOUNTER — Other Ambulatory Visit (HOSPITAL_BASED_OUTPATIENT_CLINIC_OR_DEPARTMENT_OTHER): Payer: Medicare Other

## 2011-12-22 ENCOUNTER — Ambulatory Visit (HOSPITAL_BASED_OUTPATIENT_CLINIC_OR_DEPARTMENT_OTHER): Payer: Medicare Other | Admitting: Oncology

## 2011-12-22 VITALS — BP 126/69 | HR 63 | Temp 97.0°F | Resp 20 | Ht 72.0 in | Wt 186.0 lb

## 2011-12-22 DIAGNOSIS — C679 Malignant neoplasm of bladder, unspecified: Secondary | ICD-10-CM

## 2011-12-22 DIAGNOSIS — D6481 Anemia due to antineoplastic chemotherapy: Secondary | ICD-10-CM

## 2011-12-22 DIAGNOSIS — K5900 Constipation, unspecified: Secondary | ICD-10-CM

## 2011-12-22 DIAGNOSIS — C659 Malignant neoplasm of unspecified renal pelvis: Secondary | ICD-10-CM

## 2011-12-22 DIAGNOSIS — R110 Nausea: Secondary | ICD-10-CM

## 2011-12-22 LAB — CBC WITH DIFFERENTIAL/PLATELET
Basophils Absolute: 0 10*3/uL (ref 0.0–0.1)
EOS%: 0.5 % (ref 0.0–7.0)
Eosinophils Absolute: 0 10*3/uL (ref 0.0–0.5)
HGB: 10.9 g/dL — ABNORMAL LOW (ref 13.0–17.1)
LYMPH%: 20.7 % (ref 14.0–49.0)
MCH: 30.6 pg (ref 27.2–33.4)
MCV: 91.3 fL (ref 79.3–98.0)
MONO%: 1.2 % (ref 0.0–14.0)
NEUT#: 3.1 10*3/uL (ref 1.5–6.5)
Platelets: 210 10*3/uL (ref 140–400)

## 2011-12-22 LAB — COMPREHENSIVE METABOLIC PANEL
Alkaline Phosphatase: 34 U/L — ABNORMAL LOW (ref 39–117)
BUN: 28 mg/dL — ABNORMAL HIGH (ref 6–23)
Creatinine, Ser: 1.24 mg/dL (ref 0.50–1.35)
Glucose, Bld: 123 mg/dL — ABNORMAL HIGH (ref 70–99)
Total Bilirubin: 0.4 mg/dL (ref 0.3–1.2)

## 2011-12-22 MED ORDER — PROMETHAZINE HCL 25 MG PO TABS: 25.0000 mg | ORAL_TABLET | Freq: Four times a day (QID) | ORAL | Status: DC | PRN

## 2011-12-22 NOTE — Progress Notes (Signed)
Hematology and Oncology Follow Up Visit  [name] [id number] [birth year] 70 y.o. 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 Pathology showed malignant cells consistent with transitional cell carcinoma (case number [id number]).  Current therapy: The patient started systemic chemotherapy with cisplatin and Gemzar on 12/16/2011. Cisplatin is given on day 1 and Gemzar is given on day 1 and day 8 of a 21 day cycle. The patient is here for cycle 1 day 8 today.  Interim History:  [name] is a 71 year old gentleman seen for routine followup today. The patient received his first dose of chemotherapy last week has had having a lot of problems. He has had no appetite and has lost 10 pounds. He's been having a lot of issues with nausea but not really vomiting. He states he been taking his Zofran, Compazine, Ativan on a routine basis but is not really getting much relief at this time. He was able to eat a small amount this morning however. Denies fevers, chills, night sweats, chest pain, shortness of breath, dyspnea, abdominal pain. His hematuria has resolved. He reports he is having some issues with constipation. Denies neuropathy and tinnitus.  Medications: I have reviewed the patient's current medications. Current outpatient prescriptions:amLODipine (NORVASC) 5 MG tablet, TAKE 1 TABLET (5 MG TOTAL) BY MOUTH DAILY, Disp: 90 tablet, Rfl: 2;  Ascorbic Acid (VITAMIN C) 500 MG tablet, Take 500 mg by mouth daily. , Disp: , Rfl: ;  Docusate Sodium (PHILLIPS STOOL SOFTENER PO), Take by mouth daily. , Disp: , Rfl:  HYDROcodone-acetaminophen (LORTAB) 7.5-500 MG per tablet, Take 1 tablet by mouth every 6 (six) hours as needed for pain.,  Disp: 100 tablet, Rfl: 5;  lipase/protease/amylase (CREON) 12000 UNITS CPEP, Take 2 capsules by mouth 3 (three) times daily before meals., Disp: 270 capsule, Rfl: 3;  lisinopril (PRINIVIL,ZESTRIL) 20 MG tablet, Take 1 tablet (20 mg total) by mouth daily., Disp: 90 tablet, Rfl: 2 LORazepam (ATIVAN) 1 MG tablet, Take 0.5-1 tablets (0.5-1 mg total) by mouth at bedtime as needed (sleep)., Disp: 30 tablet, Rfl: 0;  oxyCODONE-acetaminophen (PERCOCET) 10-325 MG per tablet, Take 1 tablet by mouth every 6 (six) hours as needed for pain. In the evening, Disp: 100 tablet, Rfl: 0;  promethazine (PHENERGAN) 25 MG tablet, Take 1 tablet (25 mg total) by mouth every 6 (six) hours as needed for nausea., Disp: 30 tablet, Rfl: 1 rosuvastatin (CRESTOR) 10 MG tablet, Take 1 tablet (10 mg total) by mouth every evening., Disp: 90 tablet, Rfl: 3;  Sennosides (HCA LAX-X PO), Take by mouth., Disp: , Rfl:   Allergies: No Known Allergies  Past Medical History, Surgical history, Social history, and Family History were reviewed and updated.  Review of Systems: Constitutional:  Negative for fever, chills, night sweats, anorexia, weight loss, pain. Cardiovascular: no chest pain or dyspnea on exertion Respiratory: no cough, shortness of breath, or wheezing Neurological: no TIA or stroke symptoms Dermatological: negative ENT: negative Skin: Negative. Gastrointestinal: no abdominal pain, change in bowel habits, or black or bloody stools positive for - appetite loss, constipation and nausea/vomiting Genito-Urinary: no dysuria, trouble voiding, or hematuria Hematological and Lymphatic: negative Breast: negative for breast lumps Musculoskeletal: negative Remaining ROS negative.  Physical Exam: Blood pressure 126/69, pulse 63, temperature 97 F (36.1 C), temperature  source Oral, resp. rate 20, height 6' (1.829 m), weight 186 lb (84.369 kg). ECOG: 1 General appearance: alert, cooperative and no distress Head: Normocephalic,  without obvious abnormality, atraumatic Neck: no adenopathy, no carotid bruit, no JVD, supple, symmetrical, trachea midline and thyroid not enlarged, symmetric, no tenderness/mass/nodules Lymph nodes: Cervical, supraclavicular, and axillary nodes normal. Heart:regular rate and rhythm, S1, S2 normal, no murmur, click, rub or gallop Lung:chest clear, no wheezing, rales, normal symmetric air entry, no tachypnea, retractions or cyanosis Abdomen: soft, non-tender, without masses or organomegaly EXT:no erythema, induration, or nodules   Lab Results: Lab Results  Component Value Date   WBC 4.1 12/22/2011   HGB 10.9* 12/22/2011   HCT 32.5* 12/22/2011   MCV 91.3 12/22/2011   PLT 210 12/22/2011     Chemistry      Component Value Date/Time   NA 136 12/22/2011 1125   K 3.8 12/22/2011 1125   CL 99 12/22/2011 1125   CO2 28 12/22/2011 1125   BUN 28* 12/22/2011 1125   CREATININE 1.24 12/22/2011 1125      Component Value Date/Time   CALCIUM 9.3 12/22/2011 1125   ALKPHOS 34* 12/22/2011 1125   AST 19 12/22/2011 1125   ALT 18 12/22/2011 1125   BILITOT 0.4 12/22/2011 1125     Impression and Plan: This is a 71 year old gentleman with the following issues:  1. 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 The patient is on systemic chemotherapy with cisplatin and Gemzar. The patient is due for cycle 1 day 8 tomorrow. He is experiencing grade 1 fatigue, grade 1 nausea, and grade 1 anemia. The patient tells today that he is adamant that he not receive any further chemotherapy at this point in time. I discussed with him that the side effects is experiencing are most likely due to cisplatin and that we can adjust his antibiotics to help to get through chemotherapy easier but he does not want to proceed at this point in time. His plans are to discuss with Dr Clelia Croft at his next visit other options for treatment. He is also wondering whether not he can proceed with surgery and forego any chemotherapy. I'll  therefore cancel his chemotherapy schedule tomorrow per his request and he will followup with Dr Clelia Croft as scheduled in early September.  2. Anemia. Due to his chemotherapy. His hematuria has resolved at this point in time. He is not actively bleeding and no transfusion is indicated.  3. Nausea. I have given him written instructions on how to take his Zofran, Compazine, Ativan. I have given up her scheduled for Phenergan that he may use as a Compazine to see if this is more effective for him. He is not vomiting I've encouraged him to take as much fluid by mouth to maintain hydration.  4. Constipation. I have recommended that he use MiraLax on a daily basis. If this is not effective and he may use magnesium citrate.  5. Followup. The patient will keep his followup with Dr Clelia Croft as scheduled on 01/05/2012.  Spent more than half the time coordinating care.    Clenton Pare 8/22/20133:34 PM

## 2011-12-22 NOTE — Patient Instructions (Addendum)
Take Anti Nausea Meds as Directed: 1. Zofran (ondansetron) 8 mg every 12 hrs as needed. This is more preventative for chemotherapy induced nausea and also non sedating.  2. For unrelieved nausea; Add Compazine (prochloraperzine) 10 mg every 6 hrs as needed OR Phenergan (Promethazine) 25 mg every 6 hours as needed. Take every 6 hrs if any nausea. This is sedating.  3. For continued unrelieved nausea; Add the Ativan (lorazepam) 1 mg every 8 hrs as needed (can alternate with the compazine). Can melt under the tongue and this is also sedating.   For constipation:  Use Miralax 1 capful mixed in water or juice daily. If no bowel movement in 3 days, you may  Use Magnesium citrate. Drink 1/2 bottle and if no results in 4 hours drink the remainder of the bottle.

## 2011-12-23 ENCOUNTER — Ambulatory Visit: Payer: Medicare Other

## 2011-12-26 ENCOUNTER — Ambulatory Visit (HOSPITAL_COMMUNITY): Payer: Medicare Other | Attending: Internal Medicine | Admitting: Radiology

## 2011-12-26 VITALS — BP 113/63 | HR 50 | Ht 72.0 in | Wt 191.0 lb

## 2011-12-26 DIAGNOSIS — R5381 Other malaise: Secondary | ICD-10-CM | POA: Insufficient documentation

## 2011-12-26 DIAGNOSIS — E785 Hyperlipidemia, unspecified: Secondary | ICD-10-CM

## 2011-12-26 DIAGNOSIS — R0609 Other forms of dyspnea: Secondary | ICD-10-CM | POA: Insufficient documentation

## 2011-12-26 DIAGNOSIS — I739 Peripheral vascular disease, unspecified: Secondary | ICD-10-CM | POA: Insufficient documentation

## 2011-12-26 DIAGNOSIS — I10 Essential (primary) hypertension: Secondary | ICD-10-CM

## 2011-12-26 DIAGNOSIS — R0602 Shortness of breath: Secondary | ICD-10-CM

## 2011-12-26 DIAGNOSIS — I4510 Unspecified right bundle-branch block: Secondary | ICD-10-CM

## 2011-12-26 DIAGNOSIS — Z87891 Personal history of nicotine dependence: Secondary | ICD-10-CM | POA: Insufficient documentation

## 2011-12-26 DIAGNOSIS — R002 Palpitations: Secondary | ICD-10-CM | POA: Insufficient documentation

## 2011-12-26 DIAGNOSIS — Z01810 Encounter for preprocedural cardiovascular examination: Secondary | ICD-10-CM

## 2011-12-26 DIAGNOSIS — R0789 Other chest pain: Secondary | ICD-10-CM

## 2011-12-26 DIAGNOSIS — R0989 Other specified symptoms and signs involving the circulatory and respiratory systems: Secondary | ICD-10-CM | POA: Insufficient documentation

## 2011-12-26 DIAGNOSIS — I714 Abdominal aortic aneurysm, without rupture: Secondary | ICD-10-CM

## 2011-12-26 MED ORDER — AMINOPHYLLINE 25 MG/ML IV SOLN
75.0000 mg | Freq: Once | INTRAVENOUS | Status: AC
  Administered 2011-12-26: 75 mg via INTRAVENOUS

## 2011-12-26 MED ORDER — REGADENOSON 0.4 MG/5ML IV SOLN
0.4000 mg | Freq: Once | INTRAVENOUS | Status: AC
  Administered 2011-12-26: 0.4 mg via INTRAVENOUS

## 2011-12-26 MED ORDER — TECHNETIUM TC 99M TETROFOSMIN IV KIT
10.0000 | PACK | Freq: Once | INTRAVENOUS | Status: AC | PRN
  Administered 2011-12-26: 10 via INTRAVENOUS

## 2011-12-26 MED ORDER — TECHNETIUM TC 99M TETROFOSMIN IV KIT
30.0000 | PACK | Freq: Once | INTRAVENOUS | Status: AC | PRN
  Administered 2011-12-26: 30 via INTRAVENOUS

## 2011-12-26 NOTE — Progress Notes (Signed)
[facility] SITE 3 NUCLEAR MED [address] [place] [address] [phone]  Cardiology Nuclear Med Study  [name] is a 71 y.o. male     MRN : [id number]     DOB: [birth year]  Procedure Date: 12/26/2011  Nuclear Med Background Indication for Stress Test:  Evaluation for Ischemia and Pending Clearance Bladder Surgery by Dr. Isabel Caprice and Dr. Laverle Patter History:  '95 GXT :OK per patient; 8/13 Chemo; 12/21/11 Echo:EF=60%, mild AR, severe dilatation ascending aorta, h/o Chemo Cardiac Risk Factors: Family History - CAD, History of Smoking and PVD  Symptoms:  DOE, Fatigue, Palpitations and Rapid HR   Nuclear Pre-Procedure Caffeine/Decaff Intake:  None NPO After: 7:00am   Lungs:  Clear. O2 Sat: 95% on room air. IV 0.9% NS with Angio Cath:  20g  IV Site: R Antecubital  IV Started by:  Stanton Kidney, EMT-P  Chest Size (in):  44 Cup Size: n/a  Height: 6' (1.829 m)  Weight:  191 lb (86.637 kg)  BMI:  Body mass index is 25.90 kg/(m^2). Tech Comments:  NA    Nuclear Med Study 1 or 2 day study: 1 day  Stress Test Type:  Lexiscan  Reading MD: Dietrich Pates, MD  Order Authorizing Provider:  Verne Carrow, MD  Resting Radionuclide: Technetium 28m Tetrofosmin  Resting Radionuclide Dose: 11.0 mCi   Stress Radionuclide:  Technetium 6m Tetrofosmin  Stress Radionuclide Dose: 33.0 mCi           Stress Protocol Rest HR: 50 Stress HR: 71  Rest BP: 113/63 Stress BP: 124/59  Exercise Time (min): n/a METS: n/a   Predicted Max HR: 148 bpm % Max HR: 47.97 bpm Rate Pressure Product: 8804   Dose of Adenosine (mg):  n/a Dose of Lexiscan: 0.4 mg Dose of Aminophylline: 75. mg  Dose of Atropine (mg): n/a Dose of Dobutamine: n/a mcg/kg/min (at max HR)  Stress Test Technologist: Smiley Houseman, CMA-N  Nuclear Technologist:  Domenic Polite, CNMT     Rest Procedure:  Myocardial perfusion imaging was performed at rest 45 minutes following the intravenous administration of  Technetium 76m Tetrofosmin.  Rest ECG: SR  RBBB  Stress Procedure:  The patient received IV Lexiscan 0.4 mg over 15-seconds.  Technetium 61m Tetrofosmin injected at 30-seconds.  There were no significant changes with Lexiscan.  He did c/o chest tightness, 8/10, with Lexiscan.  Aminophylline 75 mg was given 8-minutes into recovery.  Quantitative spect images were obtained after a 45 minute delay.  Stress ECG: No significant change from baseline ECG  QPS Raw Data Images:  Images were motion corrected.  Soft tissue (diaphragm, breast) surround heart. Stress Images:  Normal perfusion with mild apical thinning . Rest Images:  no significant change from the stress images. Subtraction (SDS):  No evidence of ischemia. Transient Ischemic Dilatation (Normal <1.22):  1.10 Lung/Heart Ratio (Normal <0.45):  0.28  Quantitative Gated Spect Images QGS EDV:  172 ml QGS ESV:  68 ml  Impression Exercise Capacity:  Lexiscan with no exercise. BP Response:  Normal blood pressure response. Clinical Symptoms:  Significant chest pain. ECG Impression:  No significant ST segment change suggestive of ischemia. Comparison with Prior Nuclear Study: No previous nuclear study performed  Overall Impression:  Normal stress nuclear study.  LV Ejection Fraction: 60%.  LV Wall Motion:  NL LV Function; NL Wall Motion

## 2011-12-28 ENCOUNTER — Encounter: Payer: Self-pay | Admitting: Cardiovascular Disease

## 2011-12-28 ENCOUNTER — Ambulatory Visit (INDEPENDENT_AMBULATORY_CARE_PROVIDER_SITE_OTHER): Payer: Medicare Other | Admitting: Cardiovascular Disease

## 2011-12-28 VITALS — BP 124/70 | HR 60 | Ht 72.0 in | Wt 189.0 lb

## 2011-12-28 DIAGNOSIS — Z01810 Encounter for preprocedural cardiovascular examination: Secondary | ICD-10-CM

## 2011-12-28 DIAGNOSIS — I712 Thoracic aortic aneurysm, without rupture: Secondary | ICD-10-CM

## 2011-12-28 NOTE — Progress Notes (Signed)
History of Present Illness: 71 yo male with history of HLD, thoracic aortic aneurysm, AAA, alcohol induced pancreatitis, HTN, PAD, recent diagnosis of transitional cell carcinoma of the right renal pelvis who is here today for cardiac follow up. I saw him as a new consult last week for cardiac risk assessment prior to planned surgical procedure. He is followed by Dr. Isabel Caprice and Dr. Laverle Patter with [facility] for TCC renal pelvis.  He was started on chemotherapy 12/16/11. He told me that he has not had a prior diagnosed cardiac issue. He is very limited because of his PAD. He does not describe chest pressure or pain but he has been dizzy since starting chemotherapy. His breathing has been at baseline. I arranged an echo and a stress myoview. His stress myoview showed no ischemia. His echo showed normal LV size and function with mild aortic valve insufficiency with dilated aortic root. Review of his chart shows a CT of the chest on 12/02/11 with ascending aorta measuring 5.1 cm.   He is here today for follow up. He continues to feel weak from chemotherapy. No chest pain or SOB. Playing golf later today. Limited by weakness.    Primary Care Physician: [name]   Past Medical History  Diagnosis Date  . Hx of colonic polyps   . Diverticulosis   . Hyperlipidemia   . AAA (abdominal aortic aneurysm) last ct 03-02-11    4.6cm  per note from dr fields w/ chart  . Substance abuse hx alcohol abuse  . Alcohol-induced chronic pancreatitis   . Hypertension stress test 1996- states wnl  . Peripheral vascular disease     occlusion left external iliac artery   . Gross hematuria     freq/ urge/ nocturia  . Arthritis     knees  . Peripheral arterial disease     Past Surgical History  Procedure Date  . Knee surgery 1961    left  . Wrist fracture surgery 2010    orif left wrist  . Cataract extraction w/ intraocular lens  implant, bilateral 1998  . Hernia repair 2010    Left inguinal herniorraphy  with mesh  . Cystoscopy w/ retrogrades 03/14/2011    Procedure: CYSTOSCOPY WITH RETROGRADE PYELOGRAM;  Surgeon: Valetta Fuller, MD;  Location: [facility]  Service: Urology;  Laterality: Bilateral;  . Cystoscopy with biopsy 03/14/2011    Procedure: CYSTOSCOPY WITH BIOPSY;  Surgeon: Valetta Fuller, MD;  Location: [facility];  Service: Urology;  Laterality: N/A;  c arm   . Cholecystectomy  July 1997- laparoscopic    complications of infection, w/ additional surg's 3 minor and 3 major  . Cystoscopy w/ retrogrades 11/07/2011    Procedure: CYSTOSCOPY WITH RETROGRADE PYELOGRAM;  Surgeon: Valetta Fuller, MD;  Location: [facility];  Service: Urology;  Laterality: Bilateral;  CYSTOSCOPY, (B) RETROGRADE PYELOGRAM, RIGHT FLEXIBLE URETEROSCOPY, RIGHT JJ STENT    . Cystoscopy w/ ureteral stent placement 11/07/2011    Procedure: CYSTOSCOPY WITH STENT REPLACEMENT;  Surgeon: Valetta Fuller, MD;  Location[facility];  Service: Urology;  Laterality: Right;    Current Outpatient Prescriptions  Medication Sig Dispense Refill  . amLODipine (NORVASC) 5 MG tablet TAKE 1 TABLET (5 MG TOTAL) BY MOUTH DAILY  90 tablet  2  . Ascorbic Acid (VITAMIN C) 500 MG tablet Take 500 mg by mouth daily.       Tery Sanfilippo Sodium (PHILLIPS STOOL SOFTENER PO) Take by mouth daily.       Marland Kitchen  HYDROcodone-acetaminophen (LORTAB) 7.5-500 MG per tablet Take 1 tablet by mouth every 6 (six) hours as needed for pain.  100 tablet  5  . lipase/protease/amylase (CREON) 12000 UNITS CPEP Take 2 capsules by mouth 3 (three) times daily before meals.  270 capsule  3  . lisinopril (PRINIVIL,ZESTRIL) 20 MG tablet Take 1 tablet (20 mg total) by mouth daily.  90 tablet  2  . oxyCODONE-acetaminophen (PERCOCET) 10-325 MG per tablet Take 1 tablet by mouth every 6 (six) hours as needed for pain. In the evening  100 tablet  0  . promethazine (PHENERGAN) 25 MG tablet Take 1 tablet (25 mg total) by mouth  every 6 (six) hours as needed for nausea.  30 tablet  1  . rosuvastatin (CRESTOR) 10 MG tablet Take 1 tablet (10 mg total) by mouth every evening.  90 tablet  3  . Sennosides (HCA LAX-X PO) Take by mouth.        No Known Allergies  History   Social History  . Marital Status: Divorced    Spouse Name: N/A    Number of Children: 1  . Years of Education: N/A   Occupational History  . Retired-[employer]    Social History Main Topics  . Smoking status: Former Smoker -- 2.0 packs/day for 50 years    Types: Cigarettes    Quit date: 03/11/2003  . Smokeless tobacco: Never Used  . Alcohol Use: No     hx alcohol abuse-- quit 1995  . Drug Use: No  . Sexually Active: Not on file   Other Topics Concern  . Not on file   Social History Narrative  . No narrative on file    Family History  Problem Relation Age of Onset  . Heart disease Mother 37  . Heart attack Father 16  . Heart disease Father   . Hypertension Brother     Review of Systems:  As stated in the HPI and otherwise negative.   BP 124/70  Pulse 60  Ht 6' (1.829 m)  Wt 189 lb (85.73 kg)  BMI 25.63 kg/m2  Physical Examination: General: Well developed, well nourished, NAD HEENT: OP clear, mucus membranes moist SKIN: warm, dry. No rashes. Neuro: No focal deficits Musculoskeletal: Muscle strength 5/5 all ext Psychiatric: Mood and affect normal Neck: No JVD, no carotid bruits, no thyromegaly, no lymphadenopathy. Lungs:Clear bilaterally, no wheezes, rhonci, crackles Cardiovascular: Regular rate and rhythm. No murmurs, gallops or rubs. Abdomen:Soft. Bowel sounds present. Non-tender.  Extremities: No lower extremity edema. Pulses are trace  in the bilateral DP/PT.  Echo: 12/21/11: Left ventricle: The cavity size was normal. Wall thickness was increased in a pattern of mild LVH. Systolic function was normal. The estimated ejection fraction was in the range of 55% to 60%. - Aortic valve: Mild regurgitation. -  Aorta: Severe dilatation of ascending aorta. Suggest F/U CTA to size - Left atrium: The atrium was mildly dilated. - Atrial septum: No defect or patent foramen ovale was identified.  Lexiscan Myoview: 12/26/11: Stress Procedure: The patient received IV Lexiscan 0.4 mg over 15-seconds. Technetium 69m Tetrofosmin injected at 30-seconds. There were no significant changes with Lexiscan. He did c/o chest tightness, 8/10, with Lexiscan. Aminophylline 75 mg was given 8-minutes into recovery. Quantitative spect images were obtained after a 45 minute delay.  Stress ECG: No significant change from baseline ECG  QPS  Raw Data Images: Images were motion corrected. Soft tissue (diaphragm, breast) surround heart.  Stress Images: Normal perfusion with mild apical thinning .  Rest Images: no significant change from the stress images.  Subtraction (SDS): No evidence of ischemia.  Transient Ischemic Dilatation (Normal <1.22): 1.10  Lung/Heart Ratio (Normal <0.45): 0.28  Quantitative Gated Spect Images  QGS EDV: 172 ml  QGS ESV: 68 ml  Impression  Exercise Capacity: Lexiscan with no exercise.  BP Response: Normal blood pressure response.  Clinical Symptoms: Significant chest pain.  ECG Impression: No significant ST segment change suggestive of ischemia.  Comparison with Prior Nuclear Study: No previous nuclear study performed  Overall Impression: Normal stress nuclear study.  LV Ejection Fraction: 60%. LV Wall Motion: NL LV Function; NL Wall Motion      Assessment and Plan:   1. Thoracic aortic aneurysm: Will follow for now. BP well controlled. Repeat CTA chest in 6 months.   2. Pre-operative cardiac examination: Stress test without ischemia. LV function normal. No further cardiac workup necessary prior to planned surgical procedure.

## 2011-12-28 NOTE — Patient Instructions (Addendum)
Your physician wants you to follow-up in:  6 months. You will receive a reminder letter in the mail two months in advance. If you don't receive a letter, please call our office to schedule the follow-up appointment.   

## 2012-01-05 ENCOUNTER — Telehealth: Payer: Self-pay | Admitting: Oncology

## 2012-01-05 ENCOUNTER — Telehealth: Payer: Self-pay | Admitting: *Deleted

## 2012-01-05 ENCOUNTER — Other Ambulatory Visit (HOSPITAL_BASED_OUTPATIENT_CLINIC_OR_DEPARTMENT_OTHER): Payer: Medicare Other | Admitting: Lab

## 2012-01-05 ENCOUNTER — Ambulatory Visit (HOSPITAL_BASED_OUTPATIENT_CLINIC_OR_DEPARTMENT_OTHER): Payer: Medicare Other | Admitting: Oncology

## 2012-01-05 ENCOUNTER — Ambulatory Visit: Payer: Medicare Other | Admitting: Lab

## 2012-01-05 VITALS — BP 140/72 | HR 60 | Temp 97.1°F | Resp 20 | Ht 72.0 in | Wt 186.5 lb

## 2012-01-05 DIAGNOSIS — R110 Nausea: Secondary | ICD-10-CM

## 2012-01-05 DIAGNOSIS — K5900 Constipation, unspecified: Secondary | ICD-10-CM

## 2012-01-05 DIAGNOSIS — C679 Malignant neoplasm of bladder, unspecified: Secondary | ICD-10-CM

## 2012-01-05 DIAGNOSIS — D6481 Anemia due to antineoplastic chemotherapy: Secondary | ICD-10-CM

## 2012-01-05 LAB — COMPREHENSIVE METABOLIC PANEL (CC13)
Albumin: 3.7 g/dL (ref 3.5–5.0)
Alkaline Phosphatase: 44 U/L (ref 40–150)
BUN: 15 mg/dL (ref 7.0–26.0)
Glucose: 100 mg/dl — ABNORMAL HIGH (ref 70–99)
Potassium: 4.5 mEq/L (ref 3.5–5.1)

## 2012-01-05 LAB — CBC WITH DIFFERENTIAL/PLATELET
Basophils Absolute: 0 10*3/uL (ref 0.0–0.1)
Eosinophils Absolute: 0.1 10*3/uL (ref 0.0–0.5)
HCT: 32.1 % — ABNORMAL LOW (ref 38.4–49.9)
HGB: 10.8 g/dL — ABNORMAL LOW (ref 13.0–17.1)
LYMPH%: 22.6 % (ref 14.0–49.0)
MCV: 95.5 fL (ref 79.3–98.0)
MONO%: 11.2 % (ref 0.0–14.0)
NEUT#: 2.8 10*3/uL (ref 1.5–6.5)
NEUT%: 64 % (ref 39.0–75.0)
Platelets: 420 10*3/uL — ABNORMAL HIGH (ref 140–400)
RDW: 13.7 % (ref 11.0–14.6)

## 2012-01-05 MED ORDER — LORAZEPAM 1 MG PO TABS: 0.5000 mg | ORAL_TABLET | Freq: Three times a day (TID) | ORAL | Status: DC | PRN

## 2012-01-05 MED ORDER — PROMETHAZINE HCL 25 MG PO TABS: 25.0000 mg | ORAL_TABLET | Freq: Four times a day (QID) | ORAL | Status: DC | PRN

## 2012-01-05 NOTE — Telephone Encounter (Signed)
appts made and printred for pt

## 2012-01-05 NOTE — Telephone Encounter (Signed)
Per staff message and POF I have scheduled appts.  JMW  

## 2012-01-05 NOTE — Telephone Encounter (Signed)
appts made and printed for pt  Pt awaer that tx will be added

## 2012-01-05 NOTE — Progress Notes (Signed)
Hematology and Oncology Follow Up Visit  [name] [id number] 1940-09-08 70 y.o. 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 Pathology showed malignant cells consistent with transitional cell carcinoma (case number [id number]).  Current therapy: The patient started systemic chemotherapy with cisplatin and Gemzar on 12/16/2011. Cisplatin is given on day 1 and Gemzar is given on day 1 and day 8 of a 21 day cycle. The patient is here for cycle 2 day 1 today. He missed day 8 cycle one due to poor tolerance.   Interim History:  [name] is a 71 year old gentleman seen for routine followup today. The patient received his first dose of chemotherapy on 8/16 and did poorly with it. He has had no appetite and has lost 10 pounds. He's been having a lot of issues with nausea but not really vomiting which has resolved. He states he been taking his Zofran, Compazine, Ativan on a routine basis but phenergan has helped instead  at this time. He was able to eat a small amount this morning however. Denies fevers, chills, night sweats, chest pain, shortness of breath, dyspnea, abdominal pain. His hematuria has resolved. He reports he is having some issues with constipation. Denies neuropathy and tinnitus. He reports his symptoms have resolved mostly at this time.   Medications: I have reviewed the patient's current medications. Current outpatient prescriptions:amLODipine (NORVASC) 5 MG tablet, TAKE 1 TABLET (5 MG TOTAL) BY MOUTH DAILY, Disp: 90 tablet, Rfl: 2;  Ascorbic Acid (VITAMIN C) 500 MG tablet, Take 500 mg by mouth daily. , Disp: , Rfl: ;  Docusate Sodium (PHILLIPS STOOL SOFTENER PO), Take by mouth daily. , Disp: ,  Rfl: ;  ferrous fumarate (HEMOCYTE - 106 MG FE) 325 (106 FE) MG TABS, Take 1 tablet by mouth. occasionally, Disp: , Rfl:  HYDROcodone-acetaminophen (LORTAB) 7.5-500 MG per tablet, Take 1 tablet by mouth every 6 (six) hours as needed for pain., Disp: 100 tablet, Rfl: 5;  lipase/protease/amylase (CREON) 12000 UNITS CPEP, Take 2 capsules by mouth 3 (three) times daily before meals., Disp: 270 capsule, Rfl: 3;  lisinopril (PRINIVIL,ZESTRIL) 20 MG tablet, Take 1 tablet (20 mg total) by mouth daily., Disp: 90 tablet, Rfl: 2 LORazepam (ATIVAN) 1 MG tablet, Take 0.5 tablets (0.5 mg total) by mouth every 8 (eight) hours as needed for anxiety. If  needed, Disp: 30 tablet, Rfl: 1;  ondansetron (ZOFRAN) 8 MG tablet, Take 8 mg by mouth every 8 (eight) hours as needed., Disp: , Rfl: ;  oxyCODONE-acetaminophen (PERCOCET) 10-325 MG per tablet, Take 1 tablet by mouth every 6 (six) hours as needed for pain. In the evening, Disp: 100 tablet, Rfl: 0 prochlorperazine (COMPAZINE) 10 MG tablet, Take 10 mg by mouth every 6 (six) hours as needed., Disp: , Rfl: ;  rosuvastatin (CRESTOR) 10 MG tablet, Take 1 tablet (10 mg total) by mouth every evening., Disp: 90 tablet, Rfl: 3;  Sennosides (HCA LAX-X PO), Take by mouth., Disp: , Rfl: ;  promethazine (PHENERGAN) 25 MG tablet, Take 1 tablet (25 mg total) by mouth every 6 (six) hours as needed for nausea., Disp: 30 tablet, Rfl: 1 promethazine (PHENERGAN) 25 MG tablet, Take 1 tablet (25 mg total) by mouth every 6 (six) hours as needed for nausea., Disp: 30  tablet, Rfl: 1  Allergies: No Known Allergies  Past Medical History, Surgical history, Social history, and Family History were reviewed and updated.  Review of Systems: Constitutional:  Negative for fever, chills, night sweats, anorexia, weight loss, pain. Cardiovascular: no chest pain or dyspnea on exertion Respiratory: no cough, shortness of breath, or wheezing Neurological: no TIA or stroke symptoms Dermatological:  negative ENT: negative Skin: Negative. Gastrointestinal: no abdominal pain, change in bowel habits, or black or bloody stools positive for - appetite loss, constipation and nausea/vomiting Genito-Urinary: no dysuria, trouble voiding, or hematuria Hematological and Lymphatic: negative Breast: negative for breast lumps Musculoskeletal: negative Remaining ROS negative.  Physical Exam: Blood pressure 140/72, pulse 60, temperature 97.1 F (36.2 C), temperature source Oral, resp. rate 20, height 6' (1.829 m), weight 186 lb 8 oz (84.596 kg). ECOG: 1 General appearance: alert, cooperative and no distress Head: Normocephalic, without obvious abnormality, atraumatic Neck: no adenopathy, no carotid bruit, no JVD, supple, symmetrical, trachea midline and thyroid not enlarged, symmetric, no tenderness/mass/nodules Lymph nodes: Cervical, supraclavicular, and axillary nodes normal. Heart:regular rate and rhythm, S1, S2 normal, no murmur, click, rub or gallop Lung:chest clear, no wheezing, rales, normal symmetric air entry, no tachypnea, retractions or cyanosis Abdomen: soft, non-tender, without masses or organomegaly EXT:no erythema, induration, or nodules   Lab Results: Lab Results  Component Value Date   WBC 4.1 12/22/2011   HGB 10.9* 12/22/2011   HCT 32.5* 12/22/2011   MCV 91.3 12/22/2011   PLT 210 12/22/2011     Chemistry      Component Value Date/Time   NA 136 12/22/2011 1125   K 3.8 12/22/2011 1125   CL 99 12/22/2011 1125   CO2 28 12/22/2011 1125   BUN 28* 12/22/2011 1125   CREATININE 1.24 12/22/2011 1125      Component Value Date/Time   CALCIUM 9.3 12/22/2011 1125   ALKPHOS 34* 12/22/2011 1125   AST 19 12/22/2011 1125   ALT 18 12/22/2011 1125   BILITOT 0.4 12/22/2011 1125     Impression and Plan: This is a 71 year old gentleman with the following issues:  1. 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 The patient is on systemic chemotherapy with cisplatin and Gemzar. The  patient is due for cycle 2 day 1 tomorrow. He is experiencing grade 2 fatigue, grade 2 nausea, and grade 1 anemia as well as 10 lb weight loss. He is agreeable to to try cycle two with 50 % dose reduction across the board.  He will follow up on day 8 on 9/13.  2. Anemia. Due to his chemotherapy. His hematuria has resolved at this point in time. He is not actively bleeding and no transfusion is indicated.  3. Nausea. Rx for phenergan given today which has helped.   4. Constipation. I have recommended that he use MiraLax on a daily basis. If this is not effective and he may use magnesium citrate.  5. Followup. 9/6 for day 1chemotherapy and 9/13 for day 8.     [facility] 9/5/201312:38 PM

## 2012-01-06 ENCOUNTER — Ambulatory Visit (HOSPITAL_BASED_OUTPATIENT_CLINIC_OR_DEPARTMENT_OTHER): Payer: Medicare Other

## 2012-01-06 VITALS — BP 131/75 | HR 51 | Temp 97.4°F

## 2012-01-06 DIAGNOSIS — Z5111 Encounter for antineoplastic chemotherapy: Secondary | ICD-10-CM

## 2012-01-06 DIAGNOSIS — C679 Malignant neoplasm of bladder, unspecified: Secondary | ICD-10-CM

## 2012-01-06 DIAGNOSIS — C659 Malignant neoplasm of unspecified renal pelvis: Secondary | ICD-10-CM

## 2012-01-06 MED ORDER — MANNITOL 25 % IV SOLN
Freq: Once | INTRAVENOUS | Status: AC
  Administered 2012-01-06: 09:00:00 via INTRAVENOUS
  Filled 2012-01-06: qty 10

## 2012-01-06 MED ORDER — SODIUM CHLORIDE 0.9 % IV SOLN: INTRAVENOUS | Status: DC

## 2012-01-06 MED ORDER — SODIUM CHLORIDE 0.9 % IV SOLN
37.5000 mg/m2 | Freq: Once | INTRAVENOUS | Status: AC
  Administered 2012-01-06: 80 mg via INTRAVENOUS
  Filled 2012-01-06: qty 80

## 2012-01-06 MED ORDER — DEXAMETHASONE SODIUM PHOSPHATE 4 MG/ML IJ SOLN
12.0000 mg | Freq: Once | INTRAMUSCULAR | Status: AC
  Administered 2012-01-06: 11:00:00 via INTRAVENOUS

## 2012-01-06 MED ORDER — PALONOSETRON HCL INJECTION 0.25 MG/5ML
0.2500 mg | Freq: Once | INTRAVENOUS | Status: AC
  Administered 2012-01-06: 0.25 mg via INTRAVENOUS

## 2012-01-06 MED ORDER — SODIUM CHLORIDE 0.9 % IV SOLN
150.0000 mg | Freq: Once | INTRAVENOUS | Status: AC
  Administered 2012-01-06: 150 mg via INTRAVENOUS
  Filled 2012-01-06: qty 5

## 2012-01-06 MED ORDER — SODIUM CHLORIDE 0.9 % IV SOLN
500.0000 mg/m2 | Freq: Once | INTRAVENOUS | Status: AC
  Administered 2012-01-06: 1064 mg via INTRAVENOUS
  Filled 2012-01-06: qty 28

## 2012-01-06 NOTE — Patient Instructions (Addendum)
[facility] Discharge Instructions for Patients Receiving Chemotherapy  Today you received the following chemotherapy agents Cisplatin and Gemzar.  To help prevent nausea and vomiting after your treatment, we encourage you to take your nausea medication as prescribed.   If you develop nausea and vomiting that is not controlled by your nausea medication, call the clinic. If it is after clinic hours your family physician or the after hours number for the clinic or go to the Emergency Department.   BELOW ARE SYMPTOMS THAT SHOULD BE REPORTED IMMEDIATELY:  *FEVER GREATER THAN 100.5 F  *CHILLS WITH OR WITHOUT FEVER  NAUSEA AND VOMITING THAT IS NOT CONTROLLED WITH YOUR NAUSEA MEDICATION  *UNUSUAL SHORTNESS OF BREATH  *UNUSUAL BRUISING OR BLEEDING  TENDERNESS IN MOUTH AND THROAT WITH OR WITHOUT PRESENCE OF ULCERS  *URINARY PROBLEMS  *BOWEL PROBLEMS  UNUSUAL RASH Items with * indicate a potential emergency and should be followed up as soon as possible.  One of the nurses will contact you 24 hours after your treatment. Please let the nurse know about any problems that you may have experienced. Feel free to call the clinic you have any questions or concerns. The clinic phone number is [phone].   I have been informed and understand all the instructions given to me. I know to contact the clinic, my physician, or go to the Emergency Department if any problems should occur. I do not have any questions at this time, but understand that I may call the clinic during office hours   should I have any questions or need assistance in obtaining follow up care.    __________________________________________  _____________  __________ Signature of Patient or Authorized Representative            Date                   Time    __________________________________________ Nurse's Signature    

## 2012-01-09 ENCOUNTER — Telehealth: Payer: Self-pay | Admitting: *Deleted

## 2012-01-09 NOTE — Telephone Encounter (Signed)
Patient called with c/o that throat "sore" after chemotherapy.  Patient denies bleeding, open sores, white patches in mouth/throat.  Patient has not taken a recent temperature, but is asymptomatic otherwise.  Encouraged patient to take temperatures regularly.  Clenton Pare, DNP, made aware of patient c/os and suggested that at this time, patient alleviate symptoms with OTC Cepacol/Chloroseptic, etc.

## 2012-01-10 ENCOUNTER — Other Ambulatory Visit: Payer: Self-pay | Admitting: Internal Medicine

## 2012-01-13 ENCOUNTER — Other Ambulatory Visit (HOSPITAL_BASED_OUTPATIENT_CLINIC_OR_DEPARTMENT_OTHER): Payer: Medicare Other | Admitting: Lab

## 2012-01-13 ENCOUNTER — Encounter: Payer: Self-pay | Admitting: Oncology

## 2012-01-13 ENCOUNTER — Ambulatory Visit: Payer: Medicare Other

## 2012-01-13 ENCOUNTER — Ambulatory Visit (HOSPITAL_BASED_OUTPATIENT_CLINIC_OR_DEPARTMENT_OTHER): Payer: Medicare Other | Admitting: Oncology

## 2012-01-13 VITALS — BP 128/68 | HR 57 | Temp 97.0°F | Resp 20 | Ht 72.0 in | Wt 187.8 lb

## 2012-01-13 DIAGNOSIS — D6481 Anemia due to antineoplastic chemotherapy: Secondary | ICD-10-CM

## 2012-01-13 DIAGNOSIS — K5900 Constipation, unspecified: Secondary | ICD-10-CM

## 2012-01-13 DIAGNOSIS — C679 Malignant neoplasm of bladder, unspecified: Secondary | ICD-10-CM

## 2012-01-13 DIAGNOSIS — R110 Nausea: Secondary | ICD-10-CM

## 2012-01-13 DIAGNOSIS — C659 Malignant neoplasm of unspecified renal pelvis: Secondary | ICD-10-CM

## 2012-01-13 LAB — CBC WITH DIFFERENTIAL/PLATELET
Basophils Absolute: 0 10*3/uL (ref 0.0–0.1)
Eosinophils Absolute: 0 10*3/uL (ref 0.0–0.5)
HGB: 9.9 g/dL — ABNORMAL LOW (ref 13.0–17.1)
MCV: 94 fL (ref 79.3–98.0)
MONO%: 3.5 % (ref 0.0–14.0)
NEUT#: 9.8 10*3/uL — ABNORMAL HIGH (ref 1.5–6.5)
RDW: 14.3 % (ref 11.0–14.6)
lymph#: 1.1 10*3/uL (ref 0.9–3.3)

## 2012-01-13 MED ORDER — LEVOFLOXACIN 500 MG PO TABS: 500.0000 mg | ORAL_TABLET | Freq: Every day | ORAL | Status: AC

## 2012-01-13 MED ORDER — NYSTATIN 100000 UNIT/ML MT SUSP: 500000.0000 [IU] | Freq: Four times a day (QID) | OROMUCOSAL | Status: AC

## 2012-01-13 NOTE — Patient Instructions (Addendum)
Mouth rinse:  - salt/baking soda (1 teaspoon of salt; 1 teaspoon of baking soda; in 1 Liter of water). Use 3-4 times per day. - Continue Biotene rinses 3-4 times per day.  Use Nystatin 5 cc swish and swallow four times a day until gone. Prescription sent to pharmacy.  I am prescribing an antibiotic for your chest congestion. It has been sent to your pharmacy. It is Levaquin 500 mg daily for 10 days.

## 2012-01-13 NOTE — Progress Notes (Signed)
Hematology and Oncology Follow Up Visit  [name] [id number] Jul 18, 1940 71 y.o. 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 Pathology showed malignant cells consistent with transitional cell carcinoma (case number [id number]).  Current therapy: The patient started systemic chemotherapy with cisplatin and Gemzar on 12/16/2011. Cisplatin is given on day 1 and Gemzar is given on day 1 and day 8 of a 21 day cycle. He missed cycle 1, day 8 due to poor tolerance. Cycle 2 day 1 was dose reduced by 50%.  Interim History:  [name] is a 71 year old gentleman seen for routine followup today. Patient's last cycle of chemotherapy was dose reduced by 50%. Patient today is complaining of increased fatigue, as well as a sore mouth and throat. He is also having persistent nausea and has vomited on one or 2 occasions. He states he been taking his Zofran, Compazine, Ativan on a routine basis but phenergan has helped instead  at this time. He was able to eat a small amount this morning however. Weight is stable. Denies fevers, chills, night sweats, chest pain, shortness of breath, dyspnea, abdominal pain. He reports a cough with green sputum production. His hematuria has resolved. He reports he is having some issues with constipation. Denies neuropathy and tinnitus.   Medications: I have reviewed the patient's current medications. Current outpatient prescriptions:amLODipine (NORVASC) 5 MG tablet, TAKE 1 TABLET (5 MG TOTAL) BY MOUTH DAILY, Disp: 90 tablet, Rfl: 2;  Ascorbic Acid (VITAMIN C) 500 MG tablet, Take 500 mg by mouth daily. , Disp: , Rfl: ;  CREON 12000 UNITS CPEP, TAKE 2 CAPSULES 3 TIMES DAILY, Disp: 540 capsule,  Rfl: 0;  Docusate Sodium (PHILLIPS STOOL SOFTENER PO), Take by mouth daily. , Disp: , Rfl:  ferrous fumarate (HEMOCYTE - 106 MG FE) 325 (106 FE) MG TABS, Take 1 tablet by mouth. occasionally, Disp: , Rfl: ;  HYDROcodone-acetaminophen (LORTAB) 7.5-500 MG per tablet, Take 1 tablet by mouth every 6 (six) hours as needed for pain., Disp: 100 tablet, Rfl: 5;  levofloxacin (LEVAQUIN) 500 MG tablet, Take 1 tablet (500 mg total) by mouth daily., Disp: 10 tablet, Rfl: 0 lisinopril (PRINIVIL,ZESTRIL) 20 MG tablet, Take 1 tablet (20 mg total) by mouth daily., Disp: 90 tablet, Rfl: 2;  LORazepam (ATIVAN) 1 MG tablet, Take 0.5 tablets (0.5 mg total) by mouth every 8 (eight) hours as needed for anxiety. If  needed, Disp: 30 tablet, Rfl: 1;  nystatin (MYCOSTATIN) 100000 UNIT/ML suspension, Take 5 mLs (500,000 Units total) by mouth 4 (four) times daily., Disp: 180 mL, Rfl: 0 ondansetron (ZOFRAN) 8 MG tablet, Take 8 mg by mouth every 8 (eight) hours as needed., Disp: , Rfl: ;  oxyCODONE-acetaminophen (PERCOCET) 10-325 MG per tablet, Take 1 tablet by mouth every 6 (six) hours as needed for pain. In the evening, Disp: 100 tablet, Rfl: 0;  prochlorperazine (COMPAZINE) 10 MG tablet, Take 10 mg by mouth every 6 (six) hours as needed., Disp: , Rfl:  promethazine (PHENERGAN) 25 MG tablet, Take 1 tablet (25 mg total) by mouth every 6 (six) hours as needed for nausea., Disp: 30 tablet, Rfl: 1;  promethazine (PHENERGAN) 25 MG tablet, Take 1 tablet (25 mg total) by mouth every 6 (six) hours as needed for nausea., Disp:  30 tablet, Rfl: 1;  rosuvastatin (CRESTOR) 10 MG tablet, Take 1 tablet (10 mg total) by mouth every evening., Disp: 90 tablet, Rfl: 3 Sennosides (HCA LAX-X PO), Take by mouth., Disp: , Rfl:   Allergies: No Known Allergies  Past Medical History, Surgical history, Social history, and Family History were reviewed and updated.  Review of Systems: Constitutional:  Negative for fever, chills, night sweats, anorexia, weight  loss, pain. Cardiovascular: no chest pain or dyspnea on exertion Respiratory: no shortness of breath, or wheezing Neurological: no TIA or stroke symptoms Dermatological: negative ENT: negative Skin: Negative. Gastrointestinal: no abdominal pain, change in bowel habits, or black or bloody stools positive for - appetite loss, constipation and nausea/vomiting Genito-Urinary: no dysuria, trouble voiding, or hematuria Hematological and Lymphatic: negative Breast: negative for breast lumps Musculoskeletal: negative Remaining ROS negative.  Physical Exam: Blood pressure 128/68, pulse 57, temperature 97 F (36.1 C), temperature source Oral, resp. rate 20, height 6' (1.829 m), weight 187 lb 12.8 oz (85.186 kg). ECOG: 1 General appearance: alert, cooperative and no distress Head: Normocephalic, without obvious abnormality, atraumatic Neck: no adenopathy, no carotid bruit, no JVD, supple, symmetrical, trachea midline and thyroid not enlarged, symmetric, no tenderness/mass/nodules Lymph nodes: Cervical, supraclavicular, and axillary nodes normal. Heart:regular rate and rhythm, S1, S2 normal, no murmur, click, rub or gallop Lung:chest clear, no wheezing, rales, normal symmetric air entry, no tachypnea, retractions or cyanosis Abdomen: soft, non-tender, without masses or organomegaly EXT:no erythema, induration, or nodules   Lab Results: Lab Results  Component Value Date   WBC 11.3* 01/13/2012   HGB 9.9* 01/13/2012   HCT 29.9* 01/13/2012   MCV 94.0 01/13/2012   PLT 147 01/13/2012     Chemistry      Component Value Date/Time   NA 139 01/05/2012 1303   NA 136 12/22/2011 1125   K 4.5 01/05/2012 1303   K 3.8 12/22/2011 1125   CL 105 01/05/2012 1303   CL 99 12/22/2011 1125   CO2 24 01/05/2012 1303   CO2 28 12/22/2011 1125   BUN 15.0 01/05/2012 1303   BUN 28* 12/22/2011 1125   CREATININE 0.9 01/05/2012 1303   CREATININE 1.24 12/22/2011 1125      Component Value Date/Time   CALCIUM 9.7 01/05/2012 1303    CALCIUM 9.3 12/22/2011 1125   ALKPHOS 44 01/05/2012 1303   ALKPHOS 34* 12/22/2011 1125   AST 21 01/05/2012 1303   AST 19 12/22/2011 1125   ALT 19 01/05/2012 1303   ALT 18 12/22/2011 1125   BILITOT 0.30 01/05/2012 1303   BILITOT 0.4 12/22/2011 1125     Impression and Plan: This is a 71 year old gentleman with the following issues:  1. 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 The patient is on systemic chemotherapy with cisplatin and Gemzar. He is experiencing grade 2 mucositis, grade 2 fatigue, grade 2 nausea, and grade 1 anemia. Last chemotherapy as dose reduced by 50%. The patient has inquired about an alternative regimen and specifically asked about Carboplatin or Oxaliplatin. Due to his mucositis and congestion, will hold chemotherapy today.  2. Anemia. Due to his chemotherapy. His hematuria has resolved at this point in time. He is not actively bleeding and no transfusion is indicated.  3. Nausea. Has Zofran/Compazine/Phenergan.  4. Constipation. I have recommended that he use MiraLax on a daily basis. If this is not effective and he may use magnesium citrate.  5. Mucositis. Recommended salt/baking soda rinses and Biotene. Will also given him Nystatin swish and swallow.  6. Congestion. WBC mildly elevated. I have prescribed Levaquin 500 mg daily for 10 days.  7. Followup. 9/26 to discuss further treatment.     Clenton Pare 9/13/20133:38 PM

## 2012-01-19 ENCOUNTER — Encounter: Payer: Self-pay | Admitting: Gastroenterology

## 2012-01-23 ENCOUNTER — Telehealth: Payer: Self-pay | Admitting: Internal Medicine

## 2012-01-23 MED ORDER — OXYCODONE-ACETAMINOPHEN 10-325 MG PO TABS: 1.0000 | ORAL_TABLET | Freq: Four times a day (QID) | ORAL | Status: DC | PRN

## 2012-01-23 NOTE — Telephone Encounter (Signed)
Patient called stating that he need a refill of his percocet 100 tablets. Please assist.

## 2012-01-23 NOTE — Telephone Encounter (Signed)
rx up front ready for p/u, pt aware 

## 2012-01-26 ENCOUNTER — Other Ambulatory Visit (HOSPITAL_BASED_OUTPATIENT_CLINIC_OR_DEPARTMENT_OTHER): Payer: Medicare Other | Admitting: Lab

## 2012-01-26 ENCOUNTER — Ambulatory Visit (HOSPITAL_BASED_OUTPATIENT_CLINIC_OR_DEPARTMENT_OTHER): Payer: Medicare Other | Admitting: Oncology

## 2012-01-26 ENCOUNTER — Telehealth: Payer: Self-pay | Admitting: Oncology

## 2012-01-26 VITALS — BP 139/76 | HR 57 | Temp 97.3°F | Resp 18 | Ht 72.0 in | Wt 185.8 lb

## 2012-01-26 DIAGNOSIS — R110 Nausea: Secondary | ICD-10-CM

## 2012-01-26 DIAGNOSIS — K5900 Constipation, unspecified: Secondary | ICD-10-CM

## 2012-01-26 DIAGNOSIS — D649 Anemia, unspecified: Secondary | ICD-10-CM

## 2012-01-26 DIAGNOSIS — C679 Malignant neoplasm of bladder, unspecified: Secondary | ICD-10-CM

## 2012-01-26 DIAGNOSIS — C659 Malignant neoplasm of unspecified renal pelvis: Secondary | ICD-10-CM

## 2012-01-26 LAB — COMPREHENSIVE METABOLIC PANEL (CC13)
CO2: 24 mEq/L (ref 22–29)
Creatinine: 0.9 mg/dL (ref 0.7–1.3)
Glucose: 105 mg/dl — ABNORMAL HIGH (ref 70–99)
Total Bilirubin: 0.2 mg/dL (ref 0.20–1.20)
Total Protein: 6.8 g/dL (ref 6.4–8.3)

## 2012-01-26 LAB — CBC WITH DIFFERENTIAL/PLATELET
Eosinophils Absolute: 0.1 10*3/uL (ref 0.0–0.5)
HCT: 30.4 % — ABNORMAL LOW (ref 38.4–49.9)
HGB: 10.2 g/dL — ABNORMAL LOW (ref 13.0–17.1)
LYMPH%: 16 % (ref 14.0–49.0)
MONO#: 0.6 10*3/uL (ref 0.1–0.9)
NEUT#: 4.8 10*3/uL (ref 1.5–6.5)
NEUT%: 72.5 % (ref 39.0–75.0)
Platelets: 516 10*3/uL — ABNORMAL HIGH (ref 140–400)
WBC: 6.7 10*3/uL (ref 4.0–10.3)

## 2012-01-26 NOTE — Progress Notes (Signed)
Hematology and Oncology Follow Up Visit  [name] [id number] 1940/12/05 71 y.o. 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 Pathology showed malignant cells consistent with transitional cell carcinoma (case number [id number]).  Current therapy: The patient started systemic chemotherapy with cisplatin and Gemzar on 12/16/2011. Cisplatin is given on day 1 and Gemzar is given on day 1 and day 8 of a 21 day cycle. He missed cycle 1, day 8 due to poor tolerance. Cycle 2 day 1 was dose reduced by 50%. He missed day 8 cycle 2 as well.   Interim History:  [name] is a 71 year old gentleman seen for routine followup today. Patient's last cycle of chemotherapy was dose reduced by 50%. Patient missed day 8 of cycle 2 due to increased fatigue, as well as a sore mouth and throat. He is also had persistent nausea and has vomited on one or 2 occasions. He states he been taking his Zofran, Compazine, Ativan on a routine basis but phenergan has helped instead  at this time. He was able to eat a small amount this morning however. Weight is stable. Denies fevers, chills, night sweats, chest pain, shortness of breath, dyspnea, abdominal pain. He reports a cough with green sputum production. His hematuria has resolved. He reports That most these symptoms resolved at this point. He is able to eat well. He was able to play golf last week without any issues.  He noticed lips swelling in the last 24 hours. No breathing difficulty.   Medications: I have reviewed the patient's current medications. Current outpatient prescriptions:amLODipine (NORVASC) 5 MG tablet, TAKE 1 TABLET (5 MG TOTAL) BY MOUTH DAILY, Disp: 90 tablet, Rfl: 2;   Ascorbic Acid (VITAMIN C) 500 MG tablet, Take 500 mg by mouth daily. , Disp: , Rfl: ;  CREON 12000 UNITS CPEP, TAKE 2 CAPSULES 3 TIMES DAILY, Disp: 540 capsule, Rfl: 0;  Docusate Sodium (PHILLIPS STOOL SOFTENER PO), Take by mouth daily. , Disp: , Rfl:  ferrous fumarate (HEMOCYTE - 106 MG FE) 325 (106 FE) MG TABS, Take 1 tablet by mouth. occasionally, Disp: , Rfl: ;  HYDROcodone-acetaminophen (LORTAB) 7.5-500 MG per tablet, Take 1 tablet by mouth every 6 (six) hours as needed for pain., Disp: 100 tablet, Rfl: 5;  lisinopril (PRINIVIL,ZESTRIL) 20 MG tablet, Take 1 tablet (20 mg total) by mouth daily., Disp: 90 tablet, Rfl: 2 LORazepam (ATIVAN) 1 MG tablet, Take 0.5 tablets (0.5 mg total) by mouth every 8 (eight) hours as needed for anxiety. If  needed, Disp: 30 tablet, Rfl: 1;  ondansetron (ZOFRAN) 8 MG tablet, Take 8 mg by mouth every 8 (eight) hours as needed., Disp: , Rfl: ;  oxyCODONE-acetaminophen (PERCOCET) 10-325 MG per tablet, Take 1 tablet by mouth every 6 (six) hours as needed for pain. In the evening, Disp: 100 tablet, Rfl: 0 prochlorperazine (COMPAZINE) 10 MG tablet, Take 10 mg by mouth every 6 (six) hours as needed., Disp: , Rfl: ;  promethazine (PHENERGAN) 25 MG tablet, Take 1 tablet (25 mg total) by mouth every 6 (six) hours as needed for nausea., Disp: 30 tablet, Rfl: 1;  promethazine (PHENERGAN) 25 MG tablet, Take 1 tablet (25 mg total) by mouth every 6 (six) hours as needed for nausea., Disp: 30 tablet,  Rfl: 1 rosuvastatin (CRESTOR) 10 MG tablet, Take 1 tablet (10 mg total) by mouth every evening., Disp: 90 tablet, Rfl: 3;  Sennosides (HCA LAX-X PO), Take by mouth., Disp: , Rfl:   Allergies: No Known Allergies  Past Medical History, Surgical history, Social history, and Family History were reviewed and updated.  Review of Systems: Constitutional:  Negative for fever, chills, night sweats, anorexia, weight loss, pain. Cardiovascular: no chest pain or dyspnea on exertion Respiratory: no  shortness of breath, or wheezing Neurological: no TIA or stroke symptoms Dermatological: negative ENT: negative Skin: Negative. Gastrointestinal: no abdominal pain, change in bowel habits, or black or bloody stools positive for - appetite loss, constipation and nausea/vomiting Genito-Urinary: no dysuria, trouble voiding, or hematuria Hematological and Lymphatic: negative Breast: negative for breast lumps Musculoskeletal: negative Remaining ROS negative.  Physical Exam: Blood pressure 139/76, pulse 57, temperature 97.3 F (36.3 C), temperature source Oral, resp. rate 18, height 6' (1.829 m), weight 185 lb 12.8 oz (84.278 kg). ECOG: 1 General appearance: alert, cooperative and no distress Head: Normocephalic, without obvious abnormality, atraumatic. Perioral swelling noted since last visit.  Neck: no adenopathy, no carotid bruit, no JVD, supple, symmetrical, trachea midline and thyroid not enlarged, symmetric, no tenderness/mass/nodules Lymph nodes: Cervical, supraclavicular, and axillary nodes normal. Heart:regular rate and rhythm, S1, S2 normal, no murmur, click, rub or gallop Lung:chest clear, no wheezing, rales, normal symmetric air entry, no tachypnea, retractions or cyanosis Abdomen: soft, non-tender, without masses or organomegaly EXT:no erythema, induration, or nodules   Lab Results: Lab Results  Component Value Date   WBC 6.7 01/26/2012   HGB 10.2* 01/26/2012   HCT 30.4* 01/26/2012   MCV 95.6 01/26/2012   PLT 516* 01/26/2012     Chemistry      Component Value Date/Time   NA 139 01/05/2012 1303   NA 136 12/22/2011 1125   K 4.5 01/05/2012 1303   K 3.8 12/22/2011 1125   CL 105 01/05/2012 1303   CL 99 12/22/2011 1125   CO2 24 01/05/2012 1303   CO2 28 12/22/2011 1125   BUN 15.0 01/05/2012 1303   BUN 28* 12/22/2011 1125   CREATININE 0.9 01/05/2012 1303   CREATININE 1.24 12/22/2011 1125      Component Value Date/Time   CALCIUM 9.7 01/05/2012 1303   CALCIUM 9.3 12/22/2011 1125   ALKPHOS  44 01/05/2012 1303   ALKPHOS 34* 12/22/2011 1125   AST 21 01/05/2012 1303   AST 19 12/22/2011 1125   ALT 19 01/05/2012 1303   ALT 18 12/22/2011 1125   BILITOT 0.30 01/05/2012 1303   BILITOT 0.4 12/22/2011 1125     Impression and Plan: This is a 71 year old gentleman with the following issues:  1. 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 The patient is on systemic chemotherapy with cisplatin and Gemzar. He is experiencing grade 2 mucositis, grade 2 fatigue, grade 2 nausea, and grade 1 anemia. Last chemotherapy as dose reduced by 50%. The patient has inquired about an alternative regimen and specifically asked about Carboplatin or Oxaliplatin. I explained to him that these agents are inferior to cisplatin. He is agreeable to continue with the same drugs now with 50% dose reduction.    2. Anemia. Due to his chemotherapy. His hematuria has resolved at this point in time. He is not actively bleeding and no transfusion is indicated.  3. Nausea. Has Zofran/Compazine/Phenergan.  4. Constipation. I have recommended that he use MiraLax on a daily basis. If this is not effective and  he may use magnesium citrate.  5. Mucositis. Resolved.  6. Congestion. resolved  7. Followup. 10/4 for day 8 cycle 3. He will have a CT scan on 10/15 and MD on 10/17 to discuss further chemotherapy after completing cycle 3.  8. Oral swelling: likely allergic reaction. He is taking benadryl and seems to be improving.     Claire Bridge 9/26/20138:44 AM

## 2012-01-26 NOTE — Telephone Encounter (Signed)
appts made and printed for pt  °

## 2012-01-27 ENCOUNTER — Ambulatory Visit (HOSPITAL_BASED_OUTPATIENT_CLINIC_OR_DEPARTMENT_OTHER): Payer: Medicare Other

## 2012-01-27 VITALS — BP 143/74 | HR 57 | Temp 97.4°F

## 2012-01-27 DIAGNOSIS — C679 Malignant neoplasm of bladder, unspecified: Secondary | ICD-10-CM

## 2012-01-27 DIAGNOSIS — Z5111 Encounter for antineoplastic chemotherapy: Secondary | ICD-10-CM

## 2012-01-27 DIAGNOSIS — C659 Malignant neoplasm of unspecified renal pelvis: Secondary | ICD-10-CM

## 2012-01-27 MED ORDER — SODIUM CHLORIDE 0.9 % IV SOLN
INTRAVENOUS | Status: DC
  Administered 2012-01-27: 10:00:00 via INTRAVENOUS

## 2012-01-27 MED ORDER — PALONOSETRON HCL INJECTION 0.25 MG/5ML
0.2500 mg | Freq: Once | INTRAVENOUS | Status: AC
  Administered 2012-01-27: 0.25 mg via INTRAVENOUS

## 2012-01-27 MED ORDER — SODIUM CHLORIDE 0.9 % IV SOLN
500.0000 mg/m2 | Freq: Once | INTRAVENOUS | Status: AC
  Administered 2012-01-27: 1064 mg via INTRAVENOUS
  Filled 2012-01-27: qty 28

## 2012-01-27 MED ORDER — DEXAMETHASONE SODIUM PHOSPHATE 4 MG/ML IJ SOLN
12.0000 mg | Freq: Once | INTRAMUSCULAR | Status: AC
  Administered 2012-01-27: 20 mg via INTRAVENOUS

## 2012-01-27 MED ORDER — POTASSIUM CHLORIDE 2 MEQ/ML IV SOLN
Freq: Once | INTRAVENOUS | Status: AC
  Administered 2012-01-27: 11:00:00 via INTRAVENOUS
  Filled 2012-01-27: qty 10

## 2012-01-27 MED ORDER — SODIUM CHLORIDE 0.9 % IV SOLN
150.0000 mg | Freq: Once | INTRAVENOUS | Status: AC
  Administered 2012-01-27: 150 mg via INTRAVENOUS
  Filled 2012-01-27: qty 5

## 2012-01-27 MED ORDER — SODIUM CHLORIDE 0.9 % IV SOLN
37.5000 mg/m2 | Freq: Once | INTRAVENOUS | Status: AC
  Administered 2012-01-27: 80 mg via INTRAVENOUS
  Filled 2012-01-27: qty 80

## 2012-01-27 NOTE — Patient Instructions (Signed)
[facility] Discharge Instructions for Patients Receiving Chemotherapy  Today you received the following chemotherapy agents Cisplatin/Gemzar To help prevent nausea and vomiting after your treatment, we encourage you to take your nausea medication as prescribed.    If you develop nausea and vomiting that is not controlled by your nausea medication, call the clinic. If it is after clinic hours your family physician or the after hours number for the clinic or go to the Emergency Department.   BELOW ARE SYMPTOMS THAT SHOULD BE REPORTED IMMEDIATELY:  *FEVER GREATER THAN 100.5 F  *CHILLS WITH OR WITHOUT FEVER  NAUSEA AND VOMITING THAT IS NOT CONTROLLED WITH YOUR NAUSEA MEDICATION  *UNUSUAL SHORTNESS OF BREATH  *UNUSUAL BRUISING OR BLEEDING  TENDERNESS IN MOUTH AND THROAT WITH OR WITHOUT PRESENCE OF ULCERS  *URINARY PROBLEMS  *BOWEL PROBLEMS  UNUSUAL RASH Items with * indicate a potential emergency and should be followed up as soon as possible.  One of the nurses will contact you 24 hours after your treatment. Please let the nurse know about any problems that you may have experienced. Feel free to call the clinic you have any questions or concerns. The clinic phone number is [phone].   I have been informed and understand all the instructions given to me. I know to contact the clinic, my physician, or go to the Emergency Department if any problems should occur. I do not have any questions at this time, but understand that I may call the clinic during office hours   should I have any questions or need assistance in obtaining follow up care.    __________________________________________  _____________  __________ Signature of Patient or Authorized Representative            Date                   Time    __________________________________________ Nurse's Signature    

## 2012-02-02 ENCOUNTER — Ambulatory Visit (HOSPITAL_BASED_OUTPATIENT_CLINIC_OR_DEPARTMENT_OTHER): Payer: Medicare Other | Admitting: Oncology

## 2012-02-02 ENCOUNTER — Encounter: Payer: Self-pay | Admitting: Oncology

## 2012-02-02 ENCOUNTER — Other Ambulatory Visit (HOSPITAL_BASED_OUTPATIENT_CLINIC_OR_DEPARTMENT_OTHER): Payer: Medicare Other | Admitting: Lab

## 2012-02-02 VITALS — BP 130/69 | HR 58 | Temp 97.5°F | Resp 20 | Ht 72.0 in | Wt 188.3 lb

## 2012-02-02 DIAGNOSIS — R110 Nausea: Secondary | ICD-10-CM

## 2012-02-02 DIAGNOSIS — C659 Malignant neoplasm of unspecified renal pelvis: Secondary | ICD-10-CM

## 2012-02-02 DIAGNOSIS — C679 Malignant neoplasm of bladder, unspecified: Secondary | ICD-10-CM

## 2012-02-02 DIAGNOSIS — D6481 Anemia due to antineoplastic chemotherapy: Secondary | ICD-10-CM

## 2012-02-02 LAB — COMPREHENSIVE METABOLIC PANEL (CC13)
ALT: 15 U/L (ref 0–55)
Alkaline Phosphatase: 39 U/L — ABNORMAL LOW (ref 40–150)
CO2: 22 mEq/L (ref 22–29)
Creatinine: 0.8 mg/dL (ref 0.7–1.3)
Sodium: 138 mEq/L (ref 136–145)
Total Bilirubin: 0.3 mg/dL (ref 0.20–1.20)

## 2012-02-02 LAB — CBC WITH DIFFERENTIAL/PLATELET
BASO%: 0.9 % (ref 0.0–2.0)
EOS%: 0.8 % (ref 0.0–7.0)
HCT: 28 % — ABNORMAL LOW (ref 38.4–49.9)
LYMPH%: 22.6 % (ref 14.0–49.0)
MCH: 31.6 pg (ref 27.2–33.4)
MCHC: 33.3 g/dL (ref 32.0–36.0)
MCV: 94.9 fL (ref 79.3–98.0)
MONO#: 0.1 10*3/uL (ref 0.1–0.9)
MONO%: 1.7 % (ref 0.0–14.0)
NEUT%: 74 % (ref 39.0–75.0)
Platelets: 240 10*3/uL (ref 140–400)
RBC: 2.95 10*6/uL — ABNORMAL LOW (ref 4.20–5.82)

## 2012-02-02 NOTE — Progress Notes (Signed)
Hematology and Oncology Follow Up Visit  [name] [id number] Dec 22, 1940 71 y.o. 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 Pathology showed malignant cells consistent with transitional cell carcinoma (case number [id number]).  Current therapy: The patient started systemic chemotherapy with cisplatin and Gemzar on 12/16/2011. Cisplatin is given on day 1 and Gemzar is given on day 1 and day 8 of a 21 day cycle. He missed cycle 1, day 8 due to poor tolerance. Cycle 2 day 1 was dose reduced by 50%. He missed day 8 cycle 2 as well.   Interim History:  [name] is a 71 year old gentleman seen for routine followup today. Patient's last cycle of chemotherapy was dose reduced by 50%. He received cycle 3 day 1 last week and tolerated this well. Fatigue is better. No nausea or vomiting. He is eating better and has gained back some of his weight. He states he been taking his Zofran, Compazine, Ativan on a routine basis but phenergan has helped instead  at this time. Denies fevers, chills, night sweats, chest pain, shortness of breath, dyspnea, abdominal pain. He was able to play golf last week without any issues.   Medications: I have reviewed the patient's current medications. Current outpatient prescriptions:amLODipine (NORVASC) 5 MG tablet, TAKE 1 TABLET (5 MG TOTAL) BY MOUTH DAILY, Disp: 90 tablet, Rfl: 2;  Ascorbic Acid (VITAMIN C) 500 MG tablet, Take 500 mg by mouth daily. , Disp: , Rfl: ;  CREON 12000 UNITS CPEP, TAKE 2 CAPSULES 3 TIMES DAILY, Disp: 540 capsule, Rfl: 0;  Docusate Sodium (PHILLIPS STOOL SOFTENER PO), Take by mouth daily. , Disp: , Rfl:  ferrous fumarate (HEMOCYTE - 106 MG FE) 325 (106 FE) MG  TABS, Take 1 tablet by mouth. occasionally, Disp: , Rfl: ;  HYDROcodone-acetaminophen (LORTAB) 7.5-500 MG per tablet, Take 1 tablet by mouth every 6 (six) hours as needed for pain., Disp: 100 tablet, Rfl: 5;  lisinopril (PRINIVIL,ZESTRIL) 20 MG tablet, Take 1 tablet (20 mg total) by mouth daily., Disp: 90 tablet, Rfl: 2 LORazepam (ATIVAN) 1 MG tablet, Take 0.5 tablets (0.5 mg total) by mouth every 8 (eight) hours as needed for anxiety. If  needed, Disp: 30 tablet, Rfl: 1;  ondansetron (ZOFRAN) 8 MG tablet, Take 8 mg by mouth every 8 (eight) hours as needed., Disp: , Rfl: ;  oxyCODONE-acetaminophen (PERCOCET) 10-325 MG per tablet, Take 1 tablet by mouth every 6 (six) hours as needed for pain. In the evening, Disp: 100 tablet, Rfl: 0 prochlorperazine (COMPAZINE) 10 MG tablet, Take 10 mg by mouth every 6 (six) hours as needed., Disp: , Rfl: ;  promethazine (PHENERGAN) 25 MG tablet, Take 1 tablet (25 mg total) by mouth every 6 (six) hours as needed for nausea., Disp: 30 tablet, Rfl: 1;  promethazine (PHENERGAN) 25 MG tablet, Take 1 tablet (25 mg total) by mouth every 6 (six) hours as needed for nausea., Disp: 30 tablet, Rfl: 1 rosuvastatin (CRESTOR) 10 MG tablet, Take 1 tablet (10 mg total) by mouth every evening., Disp: 90 tablet, Rfl: 3;  Sennosides (HCA LAX-X PO), Take by mouth., Disp: , Rfl:   Allergies: No Known Allergies  Past Medical History, Surgical history, Social history, and Family History were reviewed and updated.  Review of  Systems: Constitutional:  Negative for fever, chills, night sweats, anorexia, weight loss, pain. Cardiovascular: no chest pain or dyspnea on exertion Respiratory: no shortness of breath, or wheezing Neurological: no TIA or stroke symptoms Dermatological: negative ENT: negative Skin: Negative. Gastrointestinal: no abdominal pain, change in bowel habits, or black or bloody stools positive for - appetite loss, constipation and nausea/vomiting Genito-Urinary: no dysuria,  trouble voiding, or hematuria Hematological and Lymphatic: negative Breast: negative for breast lumps Musculoskeletal: negative Remaining ROS negative.  Physical Exam: Blood pressure 130/69, pulse 58, temperature 97.5 F (36.4 C), temperature source Oral, resp. rate 20, height 6' (1.829 m), weight 188 lb 4.8 oz (85.412 kg). ECOG: 1 General appearance: alert, cooperative and no distress Head: Normocephalic, without obvious abnormality, atraumatic. Perioral swelling noted since last visit.  Neck: no adenopathy, no carotid bruit, no JVD, supple, symmetrical, trachea midline and thyroid not enlarged, symmetric, no tenderness/mass/nodules Lymph nodes: Cervical, supraclavicular, and axillary nodes normal. Heart:regular rate and rhythm, S1, S2 normal, no murmur, click, rub or gallop Lung:chest clear, no wheezing, rales, normal symmetric air entry, no tachypnea, retractions or cyanosis Abdomen: soft, non-tender, without masses or organomegaly EXT:no erythema, induration, or nodules   Lab Results: Lab Results  Component Value Date   WBC 4.2 02/02/2012   HGB 9.3* 02/02/2012   HCT 28.0* 02/02/2012   MCV 94.9 02/02/2012   PLT 240 02/02/2012     Chemistry      Component Value Date/Time   NA 138 02/02/2012 1245   NA 136 12/22/2011 1125   K 4.5 02/02/2012 1245   K 3.8 12/22/2011 1125   CL 105 02/02/2012 1245   CL 99 12/22/2011 1125   CO2 22 02/02/2012 1245   CO2 28 12/22/2011 1125   BUN 18.0 02/02/2012 1245   BUN 28* 12/22/2011 1125   CREATININE 0.8 02/02/2012 1245   CREATININE 1.24 12/22/2011 1125      Component Value Date/Time   CALCIUM 9.3 02/02/2012 1245   CALCIUM 9.3 12/22/2011 1125   ALKPHOS 39* 02/02/2012 1245   ALKPHOS 34* 12/22/2011 1125   AST 20 02/02/2012 1245   AST 19 12/22/2011 1125   ALT 15 02/02/2012 1245   ALT 18 12/22/2011 1125   BILITOT 0.30 02/02/2012 1245   BILITOT 0.4 12/22/2011 1125     Impression and Plan: This is a 71 year old gentleman with the following issues:  1. Stage  IV transitional cell carcinoma of the renal pelvis. The patient is on systemic chemotherapy with cisplatin and Gemzar. He has grade 1 fatigue and grade 1 anemia. Last chemotherapy as dose reduced by 50%. He is due for cycle 3 day 8 tomorrow and recommend that he proceed without further dose modification.  2. Anemia. Due to his chemotherapy. His hematuria has resolved at this point in time. He is not actively bleeding and no transfusion is indicated.  3. Nausea. Has Zofran/Compazine/Phenergan.  4. Constipation. I have recommended that he use MiraLax on a daily basis. If this is not effective and he may use magnesium citrate.  5. Followup. He will have a CT scan on 10/15 and MD on 10/17 to discuss further chemotherapy after completing cycle 3.    CURCIO, KRISTIN 10/3/20134:20 PM

## 2012-02-03 ENCOUNTER — Ambulatory Visit (HOSPITAL_BASED_OUTPATIENT_CLINIC_OR_DEPARTMENT_OTHER): Payer: Medicare Other

## 2012-02-03 ENCOUNTER — Other Ambulatory Visit: Payer: Self-pay | Admitting: Oncology

## 2012-02-03 VITALS — BP 126/69 | HR 54 | Temp 97.8°F | Resp 18

## 2012-02-03 DIAGNOSIS — C679 Malignant neoplasm of bladder, unspecified: Secondary | ICD-10-CM

## 2012-02-03 DIAGNOSIS — Z5111 Encounter for antineoplastic chemotherapy: Secondary | ICD-10-CM

## 2012-02-03 DIAGNOSIS — C659 Malignant neoplasm of unspecified renal pelvis: Secondary | ICD-10-CM

## 2012-02-03 DIAGNOSIS — C649 Malignant neoplasm of unspecified kidney, except renal pelvis: Secondary | ICD-10-CM

## 2012-02-03 MED ORDER — ONDANSETRON 8 MG/50ML IVPB (CHCC)
8.0000 mg | Freq: Once | INTRAVENOUS | Status: AC
  Administered 2012-02-03: 8 mg via INTRAVENOUS

## 2012-02-03 MED ORDER — DEXAMETHASONE SODIUM PHOSPHATE 10 MG/ML IJ SOLN
10.0000 mg | Freq: Once | INTRAMUSCULAR | Status: AC
  Administered 2012-02-03: 10 mg via INTRAVENOUS

## 2012-02-03 MED ORDER — SODIUM CHLORIDE 0.9 % IV SOLN
500.0000 mg/m2 | Freq: Once | INTRAVENOUS | Status: AC
  Administered 2012-02-03: 1064 mg via INTRAVENOUS
  Filled 2012-02-03: qty 28

## 2012-02-03 MED ORDER — SODIUM CHLORIDE 0.9 % IV SOLN
Freq: Once | INTRAVENOUS | Status: AC
  Administered 2012-02-03: 14:00:00 via INTRAVENOUS

## 2012-02-03 NOTE — Patient Instructions (Addendum)
[facility] Discharge Instructions for Patients Receiving Chemotherapy  Today you received the following chemotherapy agents: Gemzar  To help prevent nausea and vomiting after your treatment, we encourage you to take your nausea medication.  Take it as often as prescribed.     If you develop nausea and vomiting that is not controlled by your nausea medication, call the clinic. If it is after clinic hours your family physician or the after hours number for the clinic or go to the Emergency Department.   BELOW ARE SYMPTOMS THAT SHOULD BE REPORTED IMMEDIATELY:  *FEVER GREATER THAN 100.5 F  *CHILLS WITH OR WITHOUT FEVER  NAUSEA AND VOMITING THAT IS NOT CONTROLLED WITH YOUR NAUSEA MEDICATION  *UNUSUAL SHORTNESS OF BREATH  *UNUSUAL BRUISING OR BLEEDING  TENDERNESS IN MOUTH AND THROAT WITH OR WITHOUT PRESENCE OF ULCERS  *URINARY PROBLEMS  *BOWEL PROBLEMS  UNUSUAL RASH Items with * indicate a potential emergency and should be followed up as soon as possible.  Feel free to call the clinic you have any questions or concerns. The clinic phone number is [phone].   I have been informed and understand all the instructions given to me. I know to contact the clinic, my physician, or go to the Emergency Department if any problems should occur. I do not have any questions at this time, but understand that I may call the clinic during office hours   should I have any questions or need assistance in obtaining follow up care.    __________________________________________  _____________  __________ Signature of Patient or Authorized Representative            Date                   Time    __________________________________________ Nurse's Signature    

## 2012-02-06 ENCOUNTER — Ambulatory Visit: Payer: Medicare Other | Admitting: Oncology

## 2012-02-06 ENCOUNTER — Other Ambulatory Visit: Payer: Medicare Other | Admitting: Lab

## 2012-02-06 ENCOUNTER — Other Ambulatory Visit: Payer: Self-pay | Admitting: Internal Medicine

## 2012-02-07 ENCOUNTER — Other Ambulatory Visit: Payer: Self-pay | Admitting: Internal Medicine

## 2012-02-14 ENCOUNTER — Encounter (HOSPITAL_COMMUNITY): Payer: Self-pay

## 2012-02-14 ENCOUNTER — Ambulatory Visit (HOSPITAL_COMMUNITY)
Admission: RE | Admit: 2012-02-14 | Discharge: 2012-02-14 | Disposition: A | Discharge status: Home / Self Care | Payer: Medicare Other | Source: Ambulatory Visit | Attending: Oncology | Admitting: Oncology

## 2012-02-14 DIAGNOSIS — K8689 Other specified diseases of pancreas: Secondary | ICD-10-CM | POA: Insufficient documentation

## 2012-02-14 DIAGNOSIS — I700 Atherosclerosis of aorta: Secondary | ICD-10-CM | POA: Insufficient documentation

## 2012-02-14 DIAGNOSIS — N200 Calculus of kidney: Secondary | ICD-10-CM | POA: Insufficient documentation

## 2012-02-14 DIAGNOSIS — I723 Aneurysm of iliac artery: Secondary | ICD-10-CM | POA: Insufficient documentation

## 2012-02-14 DIAGNOSIS — I517 Cardiomegaly: Secondary | ICD-10-CM | POA: Insufficient documentation

## 2012-02-14 DIAGNOSIS — C772 Secondary and unspecified malignant neoplasm of intra-abdominal lymph nodes: Secondary | ICD-10-CM | POA: Insufficient documentation

## 2012-02-14 DIAGNOSIS — Z9089 Acquired absence of other organs: Secondary | ICD-10-CM | POA: Insufficient documentation

## 2012-02-14 DIAGNOSIS — E279 Disorder of adrenal gland, unspecified: Secondary | ICD-10-CM | POA: Insufficient documentation

## 2012-02-14 DIAGNOSIS — I714 Abdominal aortic aneurysm, without rupture, unspecified: Secondary | ICD-10-CM | POA: Insufficient documentation

## 2012-02-14 DIAGNOSIS — I5189 Other ill-defined heart diseases: Secondary | ICD-10-CM | POA: Insufficient documentation

## 2012-02-14 DIAGNOSIS — I712 Thoracic aortic aneurysm, without rupture, unspecified: Secondary | ICD-10-CM | POA: Insufficient documentation

## 2012-02-14 DIAGNOSIS — C649 Malignant neoplasm of unspecified kidney, except renal pelvis: Secondary | ICD-10-CM | POA: Insufficient documentation

## 2012-02-14 DIAGNOSIS — M439 Deforming dorsopathy, unspecified: Secondary | ICD-10-CM | POA: Insufficient documentation

## 2012-02-14 DIAGNOSIS — I7789 Other specified disorders of arteries and arterioles: Secondary | ICD-10-CM | POA: Insufficient documentation

## 2012-02-14 DIAGNOSIS — I2510 Atherosclerotic heart disease of native coronary artery without angina pectoris: Secondary | ICD-10-CM | POA: Insufficient documentation

## 2012-02-14 DIAGNOSIS — N289 Disorder of kidney and ureter, unspecified: Secondary | ICD-10-CM | POA: Insufficient documentation

## 2012-02-14 DIAGNOSIS — C679 Malignant neoplasm of bladder, unspecified: Secondary | ICD-10-CM

## 2012-02-14 DIAGNOSIS — Z79899 Other long term (current) drug therapy: Secondary | ICD-10-CM | POA: Insufficient documentation

## 2012-02-14 DIAGNOSIS — K861 Other chronic pancreatitis: Secondary | ICD-10-CM | POA: Insufficient documentation

## 2012-02-14 MED ORDER — IOHEXOL 300 MG/ML  SOLN
100.0000 mL | Freq: Once | INTRAMUSCULAR | Status: AC | PRN
  Administered 2012-02-14: 100 mL via INTRAVENOUS

## 2012-02-16 ENCOUNTER — Other Ambulatory Visit (HOSPITAL_BASED_OUTPATIENT_CLINIC_OR_DEPARTMENT_OTHER): Payer: Medicare Other | Admitting: Lab

## 2012-02-16 ENCOUNTER — Telehealth: Payer: Self-pay | Admitting: Oncology

## 2012-02-16 ENCOUNTER — Ambulatory Visit (HOSPITAL_BASED_OUTPATIENT_CLINIC_OR_DEPARTMENT_OTHER): Payer: Medicare Other | Admitting: Oncology

## 2012-02-16 ENCOUNTER — Ambulatory Visit: Payer: Medicare Other | Admitting: Oncology

## 2012-02-16 VITALS — BP 135/76 | HR 52 | Temp 96.9°F | Resp 20 | Ht 72.0 in | Wt 189.2 lb

## 2012-02-16 DIAGNOSIS — C659 Malignant neoplasm of unspecified renal pelvis: Secondary | ICD-10-CM

## 2012-02-16 DIAGNOSIS — R110 Nausea: Secondary | ICD-10-CM

## 2012-02-16 DIAGNOSIS — T451X5A Adverse effect of antineoplastic and immunosuppressive drugs, initial encounter: Secondary | ICD-10-CM

## 2012-02-16 DIAGNOSIS — C679 Malignant neoplasm of bladder, unspecified: Secondary | ICD-10-CM

## 2012-02-16 LAB — COMPREHENSIVE METABOLIC PANEL (CC13)
ALT: 11 U/L (ref 0–55)
CO2: 23 mEq/L (ref 22–29)
Calcium: 9.2 mg/dL (ref 8.4–10.4)
Chloride: 107 mEq/L (ref 98–107)
Creatinine: 0.9 mg/dL (ref 0.7–1.3)
Glucose: 124 mg/dl — ABNORMAL HIGH (ref 70–99)
Total Bilirubin: 0.2 mg/dL (ref 0.20–1.20)
Total Protein: 6.7 g/dL (ref 6.4–8.3)

## 2012-02-16 LAB — CBC WITH DIFFERENTIAL/PLATELET
Eosinophils Absolute: 0 10*3/uL (ref 0.0–0.5)
HCT: 26.5 % — ABNORMAL LOW (ref 38.4–49.9)
HGB: 8.9 g/dL — ABNORMAL LOW (ref 13.0–17.1)
LYMPH%: 23.9 % (ref 14.0–49.0)
MONO#: 0.5 10*3/uL (ref 0.1–0.9)
NEUT#: 2.4 10*3/uL (ref 1.5–6.5)
NEUT%: 61.9 % (ref 39.0–75.0)
Platelets: 355 10*3/uL (ref 140–400)
WBC: 3.8 10*3/uL — ABNORMAL LOW (ref 4.0–10.3)
lymph#: 0.9 10*3/uL (ref 0.9–3.3)

## 2012-02-16 NOTE — Progress Notes (Signed)
Hematology and Oncology Follow Up Visit  RASMUS PREUSSER [id number] 12-Jan-1941 71 y.o. 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 Pathology showed malignant cells consistent with transitional cell carcinoma (case number [id number]).  Current therapy: The patient started systemic chemotherapy with cisplatin and Gemzar on 12/16/2011. Cisplatin is given on day 1 and Gemzar is given on day 1 and day 8 of a 21 day cycle. He missed cycle 1, day 8 due to poor tolerance. Cycle 2 day 1 was dose reduced by 50%. He missed day 8 cycle 2 as well.   Interim History:  Mr. Mozer is a 71 year old gentleman seen for routine followup today. Patient's last cycle of chemotherapy was dose reduced by 50%. He tolerated the third cycle well. Fatigue is better. No nausea or vomiting. He is eating better and has gained back some of his weight. He states he been taking his Zofran, Compazine, Ativan on a routine basis but phenergan has helped instead  at this time. Denies fevers, chills, night sweats, chest pain, shortness of breath, dyspnea, abdominal pain. He was able to play golf last week without any issues. He has recovered well since the last chemotherapy and was able to play golf for the last 2 days.    Medications: I have reviewed the patient's current medications. Current outpatient prescriptions:amLODipine (NORVASC) 5 MG tablet, TAKE 1 TABLET (5 MG TOTAL) BY MOUTH DAILY, Disp: 90 tablet, Rfl: 2;  Ascorbic Acid (VITAMIN C) 500 MG tablet, Take 500 mg by mouth daily. , Disp: , Rfl: ;  CREON 12000 UNITS CPEP, TAKE 2 CAPSULES 3 TIMES DAILY, Disp: 540 capsule, Rfl: 0;  CRESTOR 10 MG tablet, TAKE 1 TABLET (10MG  TOTAL) BY MOUTH EVERY  EVENING, Disp: 90 tablet, Rfl: 0 Docusate Sodium (PHILLIPS STOOL SOFTENER PO), Take by mouth daily. , Disp: , Rfl: ;  ferrous fumarate (HEMOCYTE - 106 MG FE) 325 (106 FE) MG TABS, Take 1 tablet by mouth. occasionally, Disp: , Rfl: ;  HYDROcodone-acetaminophen (LORTAB) 7.5-500 MG per tablet, Take 1 tablet by mouth every 6 (six) hours as needed for pain., Disp: 100 tablet, Rfl: 5;  lisinopril (PRINIVIL,ZESTRIL) 20 MG tablet, TAKE ONE TABLET BY MOUTH DAILY, Disp: 90 tablet, Rfl: 0 LORazepam (ATIVAN) 1 MG tablet, Take 0.5 tablets (0.5 mg total) by mouth every 8 (eight) hours as needed for anxiety. If  needed, Disp: 30 tablet, Rfl: 1;  ondansetron (ZOFRAN) 8 MG tablet, Take 8 mg by mouth every 8 (eight) hours as needed., Disp: , Rfl: ;  oxyCODONE-acetaminophen (PERCOCET) 10-325 MG per tablet, Take 1 tablet by mouth every 6 (six) hours as needed for pain. In the evening, Disp: 100 tablet, Rfl: 0 prochlorperazine (COMPAZINE) 10 MG tablet, TAKE 1 TABLET BY MOUTH EVERY 6 HOURS AS NEEDED, Disp: 30 tablet, Rfl: 2;  Sennosides (HCA LAX-X PO), Take by mouth., Disp: , Rfl: ;  promethazine (PHENERGAN) 25 MG tablet, Take 1 tablet (25 mg total) by mouth every 6 (six) hours as needed for nausea., Disp: 30 tablet, Rfl: 1 DISCONTD: promethazine (PHENERGAN) 25 MG tablet, Take 1 tablet (25 mg total) by mouth every 6 (six) hours as needed for nausea., Disp: 30 tablet, Rfl: 1  Allergies: No Known Allergies  Past Medical History, Surgical history, Social history,  and Family History were reviewed and updated.  Review of Systems: Constitutional:  Negative for fever, chills, night sweats, anorexia, weight loss, pain. Cardiovascular: no chest pain or dyspnea on exertion Respiratory: no shortness of breath, or wheezing Neurological: no TIA or stroke symptoms Dermatological: negative ENT: negative Skin: Negative. Gastrointestinal: no abdominal pain, change in bowel habits, or black or bloody stools positive for - appetite loss,  constipation and nausea/vomiting Genito-Urinary: no dysuria, trouble voiding, or hematuria Hematological and Lymphatic: negative Breast: negative for breast lumps Musculoskeletal: negative Remaining ROS negative.  Physical Exam: Blood pressure 135/76, pulse 52, temperature 96.9 F (36.1 C), temperature source Oral, resp. rate 20, height 6' (1.829 m), weight 189 lb 3.2 oz (85.821 kg). ECOG: 1 General appearance: alert, cooperative and no distress Head: Normocephalic, without obvious abnormality, atraumatic. Perioral swelling noted since last visit.  Neck: no adenopathy, no carotid bruit, no JVD, supple, symmetrical, trachea midline and thyroid not enlarged, symmetric, no tenderness/mass/nodules Lymph nodes: Cervical, supraclavicular, and axillary nodes normal. Heart:regular rate and rhythm, S1, S2 normal, no murmur, click, rub or gallop Lung:chest clear, no wheezing, rales, normal symmetric air entry, no tachypnea, retractions or cyanosis Abdomen: soft, non-tender, without masses or organomegaly EXT:no erythema, induration, or nodules   Lab Results: Lab Results  Component Value Date   WBC 3.8* 02/16/2012   HGB 8.9* 02/16/2012   HCT 26.5* 02/16/2012   MCV 96.3 02/16/2012   PLT 355 02/16/2012     Chemistry      Component Value Date/Time   NA 138 02/16/2012 1558   NA 136 12/22/2011 1125   K 4.3 02/16/2012 1558   K 3.8 12/22/2011 1125   CL 107 02/16/2012 1558   CL 99 12/22/2011 1125   CO2 23 02/16/2012 1558   CO2 28 12/22/2011 1125   BUN 15.0 02/16/2012 1558   BUN 28* 12/22/2011 1125   CREATININE 0.9 02/16/2012 1558   CREATININE 1.24 12/22/2011 1125      Component Value Date/Time   CALCIUM 9.2 02/16/2012 1558   CALCIUM 9.3 12/22/2011 1125   ALKPHOS 43 02/16/2012 1558   ALKPHOS 34* 12/22/2011 1125   AST 18 02/16/2012 1558   AST 19 12/22/2011 1125   ALT 11 02/16/2012 1558   ALT 18 12/22/2011 1125   BILITOT 0.20 02/16/2012 1558   BILITOT 0.4 12/22/2011 1125     CT CHEST,  ABDOMEN AND PELVIS WITH CONTRAST  Technique: Contiguous axial images of the chest abdomen and pelvis  were obtained after IV contrast administration.  Contrast: 100 ml Omnipaque-300  Comparison: Chest CT of 12/02/2011. Abdominal pelvic CT of  11/29/2011. Abdominal MRI of 12/13/2011 also reviewed.  CT CHEST  Findings: Lung windows demonstrate probable secretions along the  left sided trachea on image 11. Mild interstitial thickening at  the left lung base. Likely post infectious or inflammatory. Mild  motion degradation at the lung bases as well.  Soft tissue windows demonstrate no supraclavicular adenopathy.  Dilatation of the ascending aorta, 5.2 cm today versus similar on  the prior. Normal caliber of the descending and transverse  segments.  Mild cardiomegaly, with multivessel coronary artery  atherosclerosis. No pericardial or pleural effusion. No central  pulmonary embolism, on this non-dedicated study.  Pulmonary arteries are mildly enlarged, with outflow tract  measuring 3.4 cm.  No mediastinal or hilar adenopathy. Mild wedge deformity of the T7  vertebral body is without significant canal compromise. This is  similar to on the 12/02/2011 exam but new since 11/12/2005.  There is a  probable underlying Schmorl's type deformity.  The T6 vertebral body also has mild superior endplate irregularity,  felt to be unchanged.  IMPRESSION:  1. No acute process or evidence of metastatic disease in the chest.  2. Similar ascending aortic aneurysm.  3. Cardiomegaly with multivessel coronary artery atherosclerosis.  4. Pulmonary artery enlargement suggests pulmonary arterial  hypertension.  5. Similar mild T7 compression deformity. 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 Consider dedicated thoracic spine MRI to  exclude less likely underlying metastasis.  CT ABDOMEN AND PELVIS  Findings: Pneumobilia. No focal liver lesions. The areas of  serial hyperenhancement within the liver on  the prior exam are not  well visualized on this portal venous phase study.  Normal spleen, stomach.  Findings of chronic pancreatitis, pancreatic atrophy and ductal  dilatation. There is also air within the pancreatic duct. No  evidence of acute pancreatitis. Pancreatic head calcifications are  again identified, without dominant mass. Cholecystectomy without  biliary ductal dilatation.  Normal left adrenal gland. Right adrenal nodularity is unchanged  at 1.4 cm. Consistent with an adenoma on prior MRI. Bilateral  renal cysts. Punctate lower pole left renal calculus. Too small  to characterize left renal lesion. Infiltrative mass centered at  the central interpolar right kidney is no longer identified. There  is residual wall thickening within the right renal pelvis on image  16 of series 3 delay. No well-defined residual mass. No  hydronephrosis.  Aortic and branch vessel atherosclerosis. Infrarenal abdominal  aortic aneurysm with extensive mural thrombus. 4.9 x 4.4 cm on  image 84 versus 4.7 x 4.3 cm on the prior.  Improvement in retroperitoneal adenopathy. Index aortocaval node  measures 7 mm on image 78 versus 1.5 cm short axis on the prior.  Index retrocaval node measures 6 mm on image 67 versus 1.3 cm on  the prior.  Colonic stool burden suggests constipation.  Normal terminal ileum and appendix. Normal small bowel without  abdominal ascites.  No evidence of omental or peritoneal disease.  The right internal iliac is mildly prominent at 1.0 cm, similar.  The left internal iliac artery is similarly ectatic at 1.2 cm.  No pelvic adenopathy.  The left external iliac artery is likely thrombosed and  reconstituted via collaterals in the left common femoral  distribution. This is similar to on the prior exam.  Normal urinary bladder and prostate. No significant free fluid.  Bladder incompletely opacified on delayed images.  Lucency in the right glenoid is similar back to 2007 and  likely due  to a subchondral cyst.  IMPRESSION:  1. 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  2. No evidence of new or progressive disease within the  abdomen/pelvis.  3. Infrarenal abdominal aortic aneurysm may be slightly enlarged  since the prior. 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     Impression and Plan: This is a 71 year old gentleman with the following issues:  1. 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 The patient is on systemic chemotherapy with cisplatin and Gemzar. He has grade 1 fatigue and grade 1 anemia. Last chemotherapy as dose reduced by 50%. His scan from 10/15 was reviewed today and showed complete resolution of any abnormal LAD. He is due for cycle 4 day 1 tomorrow and recommend that he proceed without further dose modification.   2. Anemia. Due to his chemotherapy. His hematuria has resolved at this  point in time. He is not actively bleeding and no transfusion is indicated.  3. Nausea. Has Zofran/Compazine/Phenergan.  4. Constipation. Improved.  5. Followup. On 10/25 for day 8 cycle 4. After that, he will see Dr. Laverle Patter for evaluation for possible surgery. MD follow up is set up for 11/14.      Serin Thornell 10/17/20134:36 PM

## 2012-02-16 NOTE — Telephone Encounter (Signed)
Gave pt appt calendar for October and November 2013 , emailed Marcelino Duster regarding chemo on 02/24/12

## 2012-02-17 ENCOUNTER — Ambulatory Visit (HOSPITAL_BASED_OUTPATIENT_CLINIC_OR_DEPARTMENT_OTHER): Payer: Medicare Other

## 2012-02-17 ENCOUNTER — Telehealth: Payer: Self-pay | Admitting: *Deleted

## 2012-02-17 ENCOUNTER — Telehealth: Payer: Self-pay | Admitting: Oncology

## 2012-02-17 VITALS — BP 133/73 | HR 55 | Temp 97.7°F | Resp 20

## 2012-02-17 DIAGNOSIS — Z5111 Encounter for antineoplastic chemotherapy: Secondary | ICD-10-CM

## 2012-02-17 DIAGNOSIS — C679 Malignant neoplasm of bladder, unspecified: Secondary | ICD-10-CM

## 2012-02-17 DIAGNOSIS — C659 Malignant neoplasm of unspecified renal pelvis: Secondary | ICD-10-CM

## 2012-02-17 MED ORDER — SODIUM CHLORIDE 0.9 % IV SOLN
INTRAVENOUS | Status: DC
  Administered 2012-02-17: 10:00:00 via INTRAVENOUS

## 2012-02-17 MED ORDER — SODIUM CHLORIDE 0.9 % IV SOLN
500.0000 mg/m2 | Freq: Once | INTRAVENOUS | Status: AC
  Administered 2012-02-17: 1064 mg via INTRAVENOUS
  Filled 2012-02-17: qty 28

## 2012-02-17 MED ORDER — PALONOSETRON HCL INJECTION 0.25 MG/5ML
0.2500 mg | Freq: Once | INTRAVENOUS | Status: AC
  Administered 2012-02-17: 0.25 mg via INTRAVENOUS

## 2012-02-17 MED ORDER — DEXAMETHASONE SODIUM PHOSPHATE 4 MG/ML IJ SOLN
12.0000 mg | Freq: Once | INTRAMUSCULAR | Status: AC
  Administered 2012-02-17: 12 mg via INTRAVENOUS

## 2012-02-17 MED ORDER — SODIUM CHLORIDE 0.9 % IV SOLN
150.0000 mg | Freq: Once | INTRAVENOUS | Status: AC
  Administered 2012-02-17: 150 mg via INTRAVENOUS
  Filled 2012-02-17: qty 5

## 2012-02-17 MED ORDER — SODIUM CHLORIDE 0.9 % IV SOLN
37.5000 mg/m2 | Freq: Once | INTRAVENOUS | Status: AC
  Administered 2012-02-17: 80 mg via INTRAVENOUS
  Filled 2012-02-17: qty 80

## 2012-02-17 MED ORDER — POTASSIUM CHLORIDE 2 MEQ/ML IV SOLN
Freq: Once | INTRAVENOUS | Status: AC
  Administered 2012-02-17: 10:00:00 via INTRAVENOUS
  Filled 2012-02-17: qty 10

## 2012-02-17 NOTE — Patient Instructions (Addendum)
[facility] Discharge Instructions for Patients Receiving Chemotherapy  Today you received the following chemotherapy agents Gemzar and Cisplatin.  To help prevent nausea and vomiting after your treatment, we encourage you to take your nausea medication.   If you develop nausea and vomiting that is not controlled by your nausea medication, call the clinic. If it is after clinic hours your family physician or the after hours number for the clinic or go to the Emergency Department.   BELOW ARE SYMPTOMS THAT SHOULD BE REPORTED IMMEDIATELY:  *FEVER GREATER THAN 100.5 F  *CHILLS WITH OR WITHOUT FEVER  NAUSEA AND VOMITING THAT IS NOT CONTROLLED WITH YOUR NAUSEA MEDICATION  *UNUSUAL SHORTNESS OF BREATH  *UNUSUAL BRUISING OR BLEEDING  TENDERNESS IN MOUTH AND THROAT WITH OR WITHOUT PRESENCE OF ULCERS  *URINARY PROBLEMS  *BOWEL PROBLEMS  UNUSUAL RASH Items with * indicate a potential emergency and should be followed up as soon as possible.  One of the nurses will contact you 24 hours after your treatment. Please let the nurse know about any problems that you may have experienced. Feel free to call the clinic you have any questions or concerns. The clinic phone number is [phone].   I have been informed and understand all the instructions given to me. I know to contact the clinic, my physician, or go to the Emergency Department if any problems should occur. I do not have any questions at this time, but understand that I may call the clinic during office hours   should I have any questions or need assistance in obtaining follow up care.    __________________________________________  _____________  __________ Signature of Patient or Authorized Representative            Date                   Time    __________________________________________ Nurse's Signature

## 2012-02-17 NOTE — Telephone Encounter (Signed)
Pt has new appt calendar for October, aware of appt on 02/24/12

## 2012-02-17 NOTE — Telephone Encounter (Signed)
Per staff message I have adjusted treatment for 10/25.  JMW

## 2012-02-20 ENCOUNTER — Telehealth: Payer: Self-pay | Admitting: Internal Medicine

## 2012-02-20 NOTE — Telephone Encounter (Signed)
Ok to refill 

## 2012-02-20 NOTE — Telephone Encounter (Signed)
Pt called req refill of oxyCODONE-acetaminophen (PERCOCET) 10-325 MG per tablet  ° °

## 2012-02-21 MED ORDER — OXYCODONE-ACETAMINOPHEN 10-325 MG PO TABS: 1.0000 | ORAL_TABLET | Freq: Four times a day (QID) | ORAL | Status: DC | PRN

## 2012-02-21 NOTE — Telephone Encounter (Signed)
Ok per Dr [name], rx up front ready for p/u, pt aware 

## 2012-02-24 ENCOUNTER — Other Ambulatory Visit (HOSPITAL_BASED_OUTPATIENT_CLINIC_OR_DEPARTMENT_OTHER): Payer: Medicare Other | Admitting: Lab

## 2012-02-24 ENCOUNTER — Encounter: Payer: Self-pay | Admitting: Oncology

## 2012-02-24 ENCOUNTER — Telehealth: Payer: Self-pay | Admitting: Oncology

## 2012-02-24 ENCOUNTER — Ambulatory Visit (HOSPITAL_BASED_OUTPATIENT_CLINIC_OR_DEPARTMENT_OTHER): Payer: Medicare Other | Admitting: Oncology

## 2012-02-24 ENCOUNTER — Ambulatory Visit: Payer: Medicare Other

## 2012-02-24 VITALS — BP 135/75 | HR 52 | Temp 97.4°F | Resp 20 | Ht 72.0 in | Wt 191.6 lb

## 2012-02-24 DIAGNOSIS — C679 Malignant neoplasm of bladder, unspecified: Secondary | ICD-10-CM

## 2012-02-24 DIAGNOSIS — R110 Nausea: Secondary | ICD-10-CM

## 2012-02-24 DIAGNOSIS — C659 Malignant neoplasm of unspecified renal pelvis: Secondary | ICD-10-CM

## 2012-02-24 DIAGNOSIS — D6481 Anemia due to antineoplastic chemotherapy: Secondary | ICD-10-CM

## 2012-02-24 DIAGNOSIS — D649 Anemia, unspecified: Secondary | ICD-10-CM

## 2012-02-24 LAB — CBC WITH DIFFERENTIAL/PLATELET
BASO%: 2.8 % — ABNORMAL HIGH (ref 0.0–2.0)
EOS%: 1.1 % (ref 0.0–7.0)
MCH: 30.7 pg (ref 27.2–33.4)
MCV: 97 fL (ref 79.3–98.0)
MONO%: 2.8 % (ref 0.0–14.0)
RBC: 2.64 10*6/uL — ABNORMAL LOW (ref 4.20–5.82)
RDW: 17.6 % — ABNORMAL HIGH (ref 11.0–14.6)
lymph#: 0.9 10*3/uL (ref 0.9–3.3)
nRBC: 0 % (ref 0–0)

## 2012-02-24 MED ORDER — LORAZEPAM 1 MG PO TABS: 0.5000 mg | ORAL_TABLET | Freq: Three times a day (TID) | ORAL | Status: DC | PRN

## 2012-02-24 NOTE — Progress Notes (Signed)
Hematology and Oncology Follow Up Visit  [name] [id number] June 19, 1940 71 y.o. 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 Pathology showed malignant cells consistent with transitional cell carcinoma (case number [id number]).  Current therapy: The patient started systemic chemotherapy with cisplatin and Gemzar on 12/16/2011. Cisplatin is given on day 1 and Gemzar is given on day 1 and day 8 of a 21 day cycle. He missed cycle 1, day 8 due to poor tolerance. Cycle 2 day 1 was dose reduced by 50%. He missed day 8 cycle 2 as well.   Interim History:  Mr. [name] is a 71 year old gentleman seen for routine followup today. He tolerated the third cycle well. Fatigue is better. No nausea or vomiting. He is eating better and has gained back some of his weight. He states he been taking his Zofran, Compazine, Ativan on a routine basis but phenergan has helped instead  at this time. Denies fevers, chills, night sweats, chest pain, shortness of breath, abdominal pain. He has mild dyspnea when playing 18 holes of golf. No hematuria or other bleeding noted.  Medications: I have reviewed the patient's current medications. Current outpatient prescriptions:amLODipine (NORVASC) 5 MG tablet, TAKE 1 TABLET (5 MG TOTAL) BY MOUTH DAILY, Disp: 90 tablet, Rfl: 2;  Ascorbic Acid (VITAMIN C) 500 MG tablet, Take 500 mg by mouth daily. , Disp: , Rfl: ;  CREON 12000 UNITS CPEP, TAKE 2 CAPSULES 3 TIMES DAILY, Disp: 540 capsule, Rfl: 0;  CRESTOR 10 MG tablet, TAKE 1 TABLET (10MG  TOTAL) BY MOUTH EVERY EVENING, Disp: 90 tablet, Rfl: 0 Docusate Sodium (PHILLIPS STOOL SOFTENER PO), Take by mouth daily. , Disp: , Rfl: ;  ferrous fumarate  (HEMOCYTE - 106 MG FE) 325 (106 FE) MG TABS, Take 1 tablet by mouth. occasionally, Disp: , Rfl: ;  HYDROcodone-acetaminophen (LORTAB) 7.5-500 MG per tablet, Take 1 tablet by mouth every 6 (six) hours as needed for pain., Disp: 100 tablet, Rfl: 5;  lisinopril (PRINIVIL,ZESTRIL) 20 MG tablet, TAKE ONE TABLET BY MOUTH DAILY, Disp: 90 tablet, Rfl: 0 LORazepam (ATIVAN) 1 MG tablet, Take 0.5 tablets (0.5 mg total) by mouth every 8 (eight) hours as needed for anxiety. If  needed, Disp: 30 tablet, Rfl: 1;  ondansetron (ZOFRAN) 8 MG tablet, Take 8 mg by mouth every 8 (eight) hours as needed., Disp: , Rfl: ;  oxyCODONE-acetaminophen (PERCOCET) 10-325 MG per tablet, Take 1 tablet by mouth every 6 (six) hours as needed for pain. In the evening, Disp: 100 tablet, Rfl: 0 prochlorperazine (COMPAZINE) 10 MG tablet, TAKE 1 TABLET BY MOUTH EVERY 6 HOURS AS NEEDED, Disp: 30 tablet, Rfl: 2;  promethazine (PHENERGAN) 25 MG tablet, Take 1 tablet (25 mg total) by mouth every 6 (six) hours as needed for nausea., Disp: 30 tablet, Rfl: 1;  Sennosides (HCA LAX-X PO), Take by mouth., Disp: , Rfl:   Allergies: No Known Allergies  Past Medical History, Surgical history, Social history, and Family History were reviewed and updated.  Review of Systems: Constitutional:  Negative for fever, chills, night sweats, anorexia, weight loss, pain. Cardiovascular: no chest pain or dyspnea on exertion Respiratory: no shortness of breath, or wheezing Neurological: no TIA or stroke symptoms Dermatological: negative ENT: negative Skin: Negative. Gastrointestinal: no abdominal pain,  change in bowel habits, or black or bloody stools positive for - appetite loss, constipation and nausea/vomiting Genito-Urinary: no dysuria, trouble voiding, or hematuria Hematological and Lymphatic: negative Breast: negative for breast lumps Musculoskeletal: negative Remaining ROS negative.  Physical Exam: Blood pressure 135/75, pulse 52, temperature 97.4 F  (36.3 C), temperature source Oral, resp. rate 20, height 6' (1.829 m), weight 191 lb 9.6 oz (86.909 kg). ECOG: 1 General appearance: alert, cooperative and no distress Head: Normocephalic, without obvious abnormality, atraumatic. Perioral swelling noted since last visit.  Neck: no adenopathy, no carotid bruit, no JVD, supple, symmetrical, trachea midline and thyroid not enlarged, symmetric, no tenderness/mass/nodules Lymph nodes: Cervical, supraclavicular, and axillary nodes normal. Heart:regular rate and rhythm, S1, S2 normal, no murmur, click, rub or gallop Lung:chest clear, no wheezing, rales, normal symmetric air entry, no tachypnea, retractions or cyanosis Abdomen: soft, non-tender, without masses or organomegaly EXT:no erythema, induration, or nodules   Lab Results: Lab Results  Component Value Date   WBC 1.8* 02/24/2012   HGB 8.1* 02/24/2012   HCT 25.6* 02/24/2012   MCV 97.0 02/24/2012   PLT 276 02/24/2012     Chemistry      Component Value Date/Time   NA 138 02/16/2012 1558   NA 136 12/22/2011 1125   K 4.3 02/16/2012 1558   K 3.8 12/22/2011 1125   CL 107 02/16/2012 1558   CL 99 12/22/2011 1125   CO2 23 02/16/2012 1558   CO2 28 12/22/2011 1125   BUN 15.0 02/16/2012 1558   BUN 28* 12/22/2011 1125   CREATININE 0.9 02/16/2012 1558   CREATININE 1.24 12/22/2011 1125      Component Value Date/Time   CALCIUM 9.2 02/16/2012 1558   CALCIUM 9.3 12/22/2011 1125   ALKPHOS 43 02/16/2012 1558   ALKPHOS 34* 12/22/2011 1125   AST 18 02/16/2012 1558   AST 19 12/22/2011 1125   ALT 11 02/16/2012 1558   ALT 18 12/22/2011 1125   BILITOT 0.20 02/16/2012 1558   BILITOT 0.4 12/22/2011 1125      Impression and Plan: This is a 71 year old gentleman with the following issues:  1. 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 The patient is on systemic chemotherapy with cisplatin and Gemzar. He has grade 1 fatigue, grade 3 neutropenia, and grade 2 anemia. Chemotherapy dose reduced  by 50%. His scan from 10/15 showed complete resolution of any abnormal LAD. He is due for cycle 4 day 8 today. Will cancel chemotherapy today due to neutropenia. He has a follow-up with Dr Laverle Patter on 03/12/12 to discuss surgery.   2. Anemia. Due to his chemotherapy. His hematuria has resolved at this point in time. He is not actively bleeding and no transfusion is indicated. Will recheck CBC next week. May need a blood transfusion if Hgb drops below 7.  3. Nausea. Has Zofran/Compazine/Phenergan.  4. Constipation. Improved.  5. Followup. As scheduled on 03/15/12.     Kashari Chalmers 10/25/20132:53 PM

## 2012-02-24 NOTE — Telephone Encounter (Signed)
Gave pt appt for 10/31 lab only, November lab and MD

## 2012-03-01 ENCOUNTER — Other Ambulatory Visit (HOSPITAL_BASED_OUTPATIENT_CLINIC_OR_DEPARTMENT_OTHER): Payer: Medicare Other | Admitting: Lab

## 2012-03-01 DIAGNOSIS — D649 Anemia, unspecified: Secondary | ICD-10-CM

## 2012-03-01 LAB — CBC WITH DIFFERENTIAL/PLATELET
BASO%: 0.5 % (ref 0.0–2.0)
LYMPH%: 16.5 % (ref 14.0–49.0)
MCHC: 33.4 g/dL (ref 32.0–36.0)
MONO#: 0.5 10*3/uL (ref 0.1–0.9)
RBC: 2.85 10*6/uL — ABNORMAL LOW (ref 4.20–5.82)
RDW: 19.4 % — ABNORMAL HIGH (ref 11.0–14.6)
WBC: 4.3 10*3/uL (ref 4.0–10.3)
lymph#: 0.7 10*3/uL — ABNORMAL LOW (ref 0.9–3.3)

## 2012-03-01 LAB — HOLD TUBE, BLOOD BANK

## 2012-03-08 ENCOUNTER — Ambulatory Visit: Payer: Medicare Other | Admitting: Vascular Surgery

## 2012-03-08 ENCOUNTER — Other Ambulatory Visit: Payer: Medicare Other

## 2012-03-14 ENCOUNTER — Other Ambulatory Visit: Payer: Self-pay | Admitting: Internal Medicine

## 2012-03-14 ENCOUNTER — Other Ambulatory Visit: Payer: Self-pay | Admitting: Urology

## 2012-03-14 DIAGNOSIS — L918 Other hypertrophic disorders of the skin: Secondary | ICD-10-CM

## 2012-03-15 ENCOUNTER — Other Ambulatory Visit (HOSPITAL_BASED_OUTPATIENT_CLINIC_OR_DEPARTMENT_OTHER): Payer: Medicare Other | Admitting: Lab

## 2012-03-15 ENCOUNTER — Telehealth: Payer: Self-pay | Admitting: Oncology

## 2012-03-15 ENCOUNTER — Ambulatory Visit (HOSPITAL_BASED_OUTPATIENT_CLINIC_OR_DEPARTMENT_OTHER): Payer: Medicare Other | Admitting: Oncology

## 2012-03-15 VITALS — BP 142/77 | HR 54 | Temp 97.5°F | Resp 20 | Ht 72.0 in | Wt 195.2 lb

## 2012-03-15 DIAGNOSIS — C659 Malignant neoplasm of unspecified renal pelvis: Secondary | ICD-10-CM

## 2012-03-15 DIAGNOSIS — C679 Malignant neoplasm of bladder, unspecified: Secondary | ICD-10-CM

## 2012-03-15 LAB — COMPREHENSIVE METABOLIC PANEL (CC13)
ALT: 11 U/L (ref 0–55)
AST: 19 U/L (ref 5–34)
Albumin: 3.5 g/dL (ref 3.5–5.0)
Calcium: 9.7 mg/dL (ref 8.4–10.4)
Chloride: 107 mEq/L (ref 98–107)
Potassium: 4.8 mEq/L (ref 3.5–5.1)
Total Protein: 7.1 g/dL (ref 6.4–8.3)

## 2012-03-15 LAB — CBC WITH DIFFERENTIAL/PLATELET
BASO%: 1.2 % (ref 0.0–2.0)
EOS%: 1 % (ref 0.0–7.0)
HGB: 10.3 g/dL — ABNORMAL LOW (ref 13.0–17.1)
MCH: 32.6 pg (ref 27.2–33.4)
MCHC: 33.2 g/dL (ref 32.0–36.0)
RDW: 18.6 % — ABNORMAL HIGH (ref 11.0–14.6)
lymph#: 0.8 10*3/uL — ABNORMAL LOW (ref 0.9–3.3)

## 2012-03-15 NOTE — Progress Notes (Signed)
Hematology and Oncology Follow Up Visit  [name] [id number] 08-16-1940 71 y.o. 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 Pathology showed malignant cells consistent with transitional cell carcinoma (case number [id number]). Patient is S/P  systemic chemotherapy with cisplatin and Gemzar on 12/16/2011. Cisplatin is given on day 1 and Gemzar is given on day 1 and day 8 of a 21 day cycle. He missed cycle 1, day 8 due to poor tolerance. Cycle 2 day 1 was dose reduced by 50%. Therapy completed on 02/24/2012   Current therapy: He is under evaluation for possible salvage surgery.  Interim History:  [name] is a 71 year old gentleman seen for routine followup today. He tolerated the last chemotherapy well. Fatigue is better at this time, he gained weight since his last visit. No nausea or vomiting. He states he been taking his Zofran, Compazine, Ativan on a routine basis but phenergan has helped instead  at this time. Denies fevers, chills, night sweats, chest pain, shortness of breath, abdominal pain.   Medications: I have reviewed the patient's current medications. Current outpatient prescriptions:amLODipine (NORVASC) 5 MG tablet, TAKE 1 TABLET (5 MG TOTAL) BY MOUTH DAILY, Disp: 90 tablet, Rfl: 2;  Ascorbic Acid (VITAMIN C) 500 MG tablet, Take 500 mg by mouth daily. , Disp: , Rfl: ;  CREON 12000 UNITS CPEP, TAKE 2 CAPSULES 3 TIMES DAILY, Disp: 540 capsule, Rfl: 0;  CRESTOR 10 MG tablet, TAKE 1 TABLET (10MG  TOTAL) BY MOUTH EVERY EVENING, Disp: 90 tablet, Rfl: 0 Docusate Sodium (PHILLIPS STOOL SOFTENER PO), Take by mouth daily. , Disp: , Rfl: ;  ferrous fumarate (HEMOCYTE - 106 MG FE) 325 (106 FE) MG TABS,  Take 1 tablet by mouth. occasionally, Disp: , Rfl: ;  HYDROcodone-acetaminophen (LORTAB) 7.5-500 MG per tablet, Take 1 tablet by mouth every 6 (six) hours as needed for pain., Disp: 100 tablet, Rfl: 5;  lisinopril (PRINIVIL,ZESTRIL) 20 MG tablet, TAKE ONE TABLET BY MOUTH DAILY, Disp: 90 tablet, Rfl: 0 LORazepam (ATIVAN) 1 MG tablet, Take 0.5 tablets (0.5 mg total) by mouth every 8 (eight) hours as needed for anxiety. If  needed, Disp: 30 tablet, Rfl: 1;  ondansetron (ZOFRAN) 8 MG tablet, Take 8 mg by mouth every 8 (eight) hours as needed., Disp: , Rfl: ;  oxyCODONE-acetaminophen (PERCOCET) 10-325 MG per tablet, Take 1 tablet by mouth every 6 (six) hours as needed for pain. In the evening, Disp: 100 tablet, Rfl: 0 prochlorperazine (COMPAZINE) 10 MG tablet, TAKE 1 TABLET BY MOUTH EVERY 6 HOURS AS NEEDED, Disp: 30 tablet, Rfl: 2;  Sennosides (HCA LAX-X PO), Take by mouth., Disp: , Rfl: ;  promethazine (PHENERGAN) 25 MG tablet, Take 1 tablet (25 mg total) by mouth every 6 (six) hours as needed for nausea., Disp: 30 tablet, Rfl: 1  Allergies: No Known Allergies  Past Medical History, Surgical history, Social history, and Family History were reviewed and updated.  Review of Systems: Constitutional:  Negative for fever, chills, night sweats, anorexia, weight loss, pain. Cardiovascular: no chest pain or dyspnea on exertion Respiratory: no shortness of breath, or wheezing Neurological: no TIA or stroke symptoms Dermatological: negative ENT: negative Skin: Negative. Gastrointestinal: no abdominal pain, change in bowel habits, or black or bloody stools positive for -  appetite loss, constipation and nausea/vomiting Genito-Urinary: no dysuria, trouble voiding, or hematuria Hematological and Lymphatic: negative Breast: negative for breast lumps Musculoskeletal: negative Remaining ROS negative.  Physical Exam: Blood pressure 142/77, pulse 54, temperature 97.5 F (36.4 C), temperature source Oral, resp.  rate 20, height 6' (1.829 m), weight 195 lb 3.2 oz (88.542 kg). ECOG: 1 General appearance: alert, cooperative and no distress Head: Normocephalic, without obvious abnormality, atraumatic. Perioral swelling noted since last visit.  Neck: no adenopathy, no carotid bruit, no JVD, supple, symmetrical, trachea midline and thyroid not enlarged, symmetric, no tenderness/mass/nodules Lymph nodes: Cervical, supraclavicular, and axillary nodes normal. Heart:regular rate and rhythm, S1, S2 normal, no murmur, click, rub or gallop Lung:chest clear, no wheezing, rales, normal symmetric air entry, no tachypnea, retractions or cyanosis Abdomen: soft, non-tender, without masses or organomegaly EXT:no erythema, induration, or nodules   Lab Results: Lab Results  Component Value Date   WBC 4.8 03/15/2012   HGB 10.3* 03/15/2012   HCT 31.1* 03/15/2012   MCV 98.2* 03/15/2012   PLT 251 03/15/2012     Chemistry      Component Value Date/Time   NA 139 03/15/2012 1507   NA 136 12/22/2011 1125   K 4.8 03/15/2012 1507   K 3.8 12/22/2011 1125   CL 107 03/15/2012 1507   CL 99 12/22/2011 1125   CO2 25 03/15/2012 1507   CO2 28 12/22/2011 1125   BUN 13.0 03/15/2012 1507   BUN 28* 12/22/2011 1125   CREATININE 0.8 03/15/2012 1507   CREATININE 1.24 12/22/2011 1125      Component Value Date/Time   CALCIUM 9.7 03/15/2012 1507   CALCIUM 9.3 12/22/2011 1125   ALKPHOS 47 03/15/2012 1507   ALKPHOS 34* 12/22/2011 1125   AST 19 03/15/2012 1507   AST 19 12/22/2011 1125   ALT 11 03/15/2012 1507   ALT 18 12/22/2011 1125   BILITOT 0.26 03/15/2012 1507   BILITOT 0.4 12/22/2011 1125      Impression and Plan: This is a 71 year old gentleman with the following issues:  1. 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 The patient is S/P systemic chemotherapy with cisplatin and Gemzar. Chemotherapy dose reduced by 50%. His scan from 10/15 showed complete resolution of any abnormal LAD. He is tentatively scheduled  for salvage surgery by Dr Laverle Patter on 04/2012.   2. Anemia. Due to his chemotherapy. resolved  3. Nausea. Resolved now  4. Constipation. Improved.  5. Followup. After surgery in 05/2012    [facility] 11/14/20133:47 PM

## 2012-03-15 NOTE — Telephone Encounter (Signed)
appts made and printed for pt aom °

## 2012-03-21 ENCOUNTER — Other Ambulatory Visit: Payer: Self-pay | Admitting: Internal Medicine

## 2012-03-21 NOTE — Telephone Encounter (Signed)
Pt needs refill on PERCOCET 10-325mg , 1 / 6 hrs. Phone no. [phone]

## 2012-03-21 NOTE — Telephone Encounter (Signed)
Rx last filled 02/21/12 #100. Pt last seen 12/14/11.  Pls advise.

## 2012-03-22 MED ORDER — OXYCODONE-ACETAMINOPHEN 10-325 MG PO TABS: 1.0000 | ORAL_TABLET | Freq: Four times a day (QID) | ORAL | Status: DC | PRN

## 2012-03-22 NOTE — Telephone Encounter (Signed)
rx up front ready for p/u, pt aware 

## 2012-03-22 NOTE — Telephone Encounter (Signed)
Ok to refill 

## 2012-04-09 ENCOUNTER — Other Ambulatory Visit: Payer: Self-pay | Admitting: Internal Medicine

## 2012-04-09 ENCOUNTER — Encounter (HOSPITAL_COMMUNITY): Payer: Self-pay | Admitting: Pharmacy Technician

## 2012-04-10 ENCOUNTER — Encounter (HOSPITAL_COMMUNITY): Payer: Self-pay

## 2012-04-10 ENCOUNTER — Encounter (HOSPITAL_COMMUNITY)
Admission: RE | Admit: 2012-04-10 | Discharge: 2012-04-10 | Disposition: A | Discharge status: Home / Self Care | Payer: Medicare Other | Source: Ambulatory Visit | Attending: Urology | Admitting: Urology

## 2012-04-10 LAB — CBC
HCT: 37.2 % — ABNORMAL LOW (ref 39.0–52.0)
Hemoglobin: 12.1 g/dL — ABNORMAL LOW (ref 13.0–17.0)
MCH: 32 pg (ref 26.0–34.0)
MCHC: 32.5 g/dL (ref 30.0–36.0)

## 2012-04-10 LAB — BASIC METABOLIC PANEL
BUN: 14 mg/dL (ref 6–23)
Chloride: 102 mEq/L (ref 96–112)
Glucose, Bld: 96 mg/dL (ref 70–99)
Potassium: 5 mEq/L (ref 3.5–5.1)

## 2012-04-10 LAB — SURGICAL PCR SCREEN: Staphylococcus aureus: NEGATIVE

## 2012-04-10 NOTE — Patient Instructions (Signed)
20 DALEY GOSSE  04/10/2012   Your procedure is scheduled on: 04/16/12  Report to [facility] at 08:30 AM.  Call this number if you have problems the morning of surgery [phone]: [phone]   Remember: complete bowel prep   Do not eat food or drink liquids After Midnight. (follow bowel prep)     Take these medicines the morning of surgery with A SIP OF WATER: norvasc, lortab as needed, ativan if needed   Do not wear jewelry, make-up or nail polish.  Do not wear lotions, powders, or perfumes. You may wear deodorant.  Do not shave 48 hours prior to surgery. Men may shave face and neck.  Do not bring valuables to the hospital.  Contacts, dentures or bridgework may not be worn into surgery.  Leave suitcase in the car. After surgery it may be brought to your room.  For patients admitted to the hospital, checkout time is 11:00 AM the day of discharge.   Special Instructions: Shower using CHG 2 nights before surgery and the night before surgery.  If you shower the day of surgery use CHG.  Use special wash - you have one bottle of CHG for all showers.  You should use approximately 1/3 of the bottle for each shower.   Please read over the following fact sheets that you were given: MRSA Information, blood fact sheet, incentive spirometer [name], RN  pre op nurse call if needed [phone]    FAILURE TO FOLLOW THESE INSTRUCTIONS MAY RESULT IN CANCELLATION OF YOUR SURGERY   Patient Signature: ___________________________________________

## 2012-04-10 NOTE — Progress Notes (Addendum)
ECHO 12/21/11 on chart, stress test 12/26/11 on chart, last cardiac office visit and cardiac clearance Dr. Clifton James 12/28/11 on chart, last office visit Clenton Pare NP on 02/24/12 on chart, CT abdomen and pelvis with contrast results on 02/14/12 on chart, CT chest 02/14/12 on chart, chest x-ray 11/29/11 on [facility], EKG 12/20/11 on [facility]

## 2012-04-12 ENCOUNTER — Other Ambulatory Visit: Payer: Self-pay | Admitting: Oncology

## 2012-04-13 NOTE — H&P (Signed)
History of Present Illness  [name] is a 71 year old who presented initially to Dr. Isabel Caprice in October 2012 with multiple episodes of painless gross hematuria.  He underwent an initial evaluation with unremarkable cystoscopy, CT imaging which revealed an enlarging AAA (followed by vascular surgery) and 1 mm left lower pole renal calculus and a positive cytology. This prompted an endoscopic evaluation in the OR in November 2012 including cystoscopy and bladder biopsies and retrograde pyelograms.  His imaging findings and endoscopic findings were unremarkable and his biopsies and repeat cytology were negative for malignancy.  He then followed up in April 2013 and had continued to have intermittent painless hematuria.  While a voided cytology was again suspicious, FISH and NMP-22 testing were negative for malignancy. He was observed and presented again in July 2013 when he developed 10 consecutive days of gross hematuria.  Office cystoscopy at that time demostrated lateralizing hematuria from the right ureter.  Dr. Isabel Caprice then proceeded with further operative intervention including cystoscopy and right ureteroscopy which revealed endoscopic findings consistent with flat and probably high-grade urothelial carcinoma lesions.  While the biopsy from the right renal pelvis showed only inflammation, his cytology from the right renal pelvis demonstrated malignant cells. He underwent a staging evaluation including CT imaging of the abdomen and pelvis which indicated extensive retroperitoneal lymphadenopathy.  He was therefore seen by Dr. Clelia Croft and treated with systemic chemotherapy.  His comorbidities include a 4.6 cm AAA. He is followed by Dr. Darrick Penna and most recently underwent an ultrasound of the abdomen in April which demonstrated his AAA to measure 4.2 cm. He is being monitored every 6 months. He also has a history of peripheral artery disease.  He previously underwent a complicated cholecystectomy for an infected  gallbladder which required subsequent re-exploration. He estimates he underwent approximately 6 procedures and has a well-healed subcostal right upper quadrant incision as well as a right flank incision. He said both of these incisions were for issues related to his infected gallbladder. In addition, he has a history of chronic pancreatitis which is done with for many years. Fortunately, he has not required recent hospitalization but does have epigastric pain symptoms on a daily basis typically after eating. These are monitored chronically with hydrocodone during the daytime 7.5 mg and oxycodone at night. There is no family history of end-stage renal disease or urothelial carcinoma. He is a long-time smoker and smoked 2 packs per day for some time.   He completed 3 cycles of cisplatin-based chemotherapy under the care of Dr. Clelia Croft. Although he did necessitate dose reduction due to anemia, he was found to have excellent response to chemotherapy with almost complete resolution of his lymphadenopathy. A review of his CT scan from 02/14/12, he still has some small subcentimeter shotty lymph nodes in the retroperitoneum which correlate to his prior lymphadenopathy. However, his index lesion on his initial scan on July 30 was almost 2 cm indicating an excellent response to chemotherapy. He has no evidence of pulmonary metastasis that are obviously apparent. In addition, clinically he has done well without gross hematuria over the past few months. Overall, he does have fatigue and energy loss which has resulted from his chemotherapy treatment. However, he has been able to continue playing golf and maintain a very good performance status.    He did see Dr. Earney Hamburg in late August for a cardiac evaluation. He had a stress test with no evidence of inducible ischemia and an estimated ejection fraction of 55-60%. He was  felt to be at relatively low risk for cardiac complications. Again seen has been his known  thoracic aneurysm which measures approximately 5.2 cm and has been monitored with surveillance.   Past Medical History Problems  1. History of  Aneurysm Of The Abdominal Aorta 441.4 2. History of  Arthritis V13.4 3. History of  Chronic Pancreatitis 577.1 4. History of  Hyperlipidemia 272.4 5. History of  Hypertension 401.9 6. History of  Peripheral Vascular Disease 443.9 7. History of  Sleep Disturbances 780.50  Surgical History Problems  1. History of  Cystoscopy With Biopsy 2. History of  Cystoscopy With Insertion Of Ureteral Stent Right 3. History of  Cystoscopy With Ureteroscopy For Biopsy Right 4. History of  Gallbladder Surgery 5. History of  Hernia Repair 6. History of  Knee Surgery 7. History of  Wrist Surgery  Current Meds 1. AmLODIPine Besylate 5 MG Oral Tablet; Therapy: (Recorded:26Oct2012) to 2. Aspirin 81 MG Oral Tablet; Therapy: (Recorded:12Apr2013) to 3. Benadryl 25 MG Oral Tablet; 1 tablet every 6 hours as needed; Therapy: (Recorded:26Oct2012) to 4. Creon 12000 UNIT Oral Capsule Delayed Release Particles; Therapy: (Recorded:02Jul2013) to 5. Crestor 10 MG Oral Tablet; Therapy: (Recorded:26Oct2012) to 6. Docusate Sodium 100 MG Oral Capsule; TAKE 1 CAPSULE DAILY; Therapy:  (Recorded:26Oct2012) to 7. Fish Oil Concentrate 1000 MG Oral Capsule; TAKE 1 CAPSULE DAILY; Therapy:  (Recorded:26Oct2012) to 8. Hydrocodone-Acetaminophen 7.5-500 MG Oral Tablet; Therapy: (Recorded:12Apr2013) to 9. Lisinopril 20 MG Oral Tablet; Therapy: (Recorded:26Oct2012) to 10. Multi-Vitamin Oral Tablet; Therapy: (Recorded:12Apr2013) to 11. Uribel 118 MG Oral Capsule; Therapy: 08Jul2013 to 12. Vitamin C 500 MG Oral Tablet; 1 per day; Therapy: (Recorded:26Oct2012) to  Allergies Medication  1. No Known Drug Allergies  Family History Problems  1. Paternal history of  Cholelithiasis 2. Family history of  Father Deceased At Age ____ 49: Heart Disease 3. Maternal history of  Heart Disease  V17.49 4. Paternal history of  Heart Disease V17.49 5. Family history of  Mother Deceased At Age ____ 38: Heart Disease  Social History Problems  1. Former Smoker V15.82 Patient started smoking at the age of 38 or 63. He got up to 2 packs per day. Smoked for 49-50 years. In college he would quit smoking at times for 6-7 months for about 5 years. After graduating college he began to smoke daily. Quit smoking 8 years ago in 2004. Denies any other forms of tobacco use. 2. Marital History - Divorced V61.03 3. Occupation: Retired: Geneticist, molecular - [employer] 4. 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 Heavily drinking alcohol in his mid 20's. He quit drinking in 1995.  Review of Systems  Genitourinary: no hematuria.  Gastrointestinal: no nausea and no vomiting.  Constitutional: no fever, no night sweats and no recent weight loss.  Cardiovascular: no chest pain and no leg swelling.  Respiratory: no shortness of breath and no cough.    Vitals  BMI Calculated: 24.92 BSA Calculated: 2.07 Weight: 186 lb    Physical Exam Constitutional: Well nourished and well developed . No acute distress.  ENT:. The ears and nose are normal in appearance.  Neck: The appearance of the neck is normal and no neck mass is present.  Pulmonary: No respiratory distress, normal respiratory rhythm and effort and clear bilateral breath sounds.  Cardiovascular: Heart rate and rhythm are normal . No peripheral edema.  Abdomen: right upper quadrant, right flank incision site(s) well healed. The abdomen is soft and nontender. No masses are palpated. No CVA tenderness. No  hernias are palpable. No hepatosplenomegaly noted.         Assessment Assessed  1. Renal Pelvis Carcinoma 189.1   Discussion/Summary  1. Urothelial carcinoma of the right renal pelvis: He will proceed with a right nephroureterectomy and RPLND.

## 2012-04-16 ENCOUNTER — Encounter (HOSPITAL_COMMUNITY): Payer: Self-pay | Admitting: *Deleted

## 2012-04-16 ENCOUNTER — Encounter (HOSPITAL_COMMUNITY)
Admission: RE | Disposition: A | Discharge status: Home Health | Payer: Self-pay | Source: Ambulatory Visit | Attending: Urology

## 2012-04-16 ENCOUNTER — Encounter (HOSPITAL_COMMUNITY): Payer: Self-pay | Admitting: Anesthesiology

## 2012-04-16 ENCOUNTER — Inpatient Hospital Stay (HOSPITAL_COMMUNITY)
Admission: RE | Admit: 2012-04-16 | Discharge: 2012-04-22 | DRG: 657 | Disposition: A | Discharge status: Home Health | Payer: Medicare Other | Source: Ambulatory Visit | Attending: Urology | Admitting: Urology

## 2012-04-16 ENCOUNTER — Inpatient Hospital Stay (HOSPITAL_COMMUNITY): Payer: Medicare Other | Admitting: Anesthesiology

## 2012-04-16 DIAGNOSIS — I739 Peripheral vascular disease, unspecified: Secondary | ICD-10-CM | POA: Diagnosis present

## 2012-04-16 DIAGNOSIS — I714 Abdominal aortic aneurysm, without rupture, unspecified: Secondary | ICD-10-CM | POA: Diagnosis present

## 2012-04-16 DIAGNOSIS — R599 Enlarged lymph nodes, unspecified: Secondary | ICD-10-CM | POA: Diagnosis present

## 2012-04-16 DIAGNOSIS — Z79899 Other long term (current) drug therapy: Secondary | ICD-10-CM

## 2012-04-16 DIAGNOSIS — C649 Malignant neoplasm of unspecified kidney, except renal pelvis: Secondary | ICD-10-CM

## 2012-04-16 DIAGNOSIS — Z87891 Personal history of nicotine dependence: Secondary | ICD-10-CM

## 2012-04-16 DIAGNOSIS — K861 Other chronic pancreatitis: Secondary | ICD-10-CM | POA: Diagnosis present

## 2012-04-16 DIAGNOSIS — E785 Hyperlipidemia, unspecified: Secondary | ICD-10-CM | POA: Diagnosis present

## 2012-04-16 DIAGNOSIS — C659 Malignant neoplasm of unspecified renal pelvis: Principal | ICD-10-CM | POA: Diagnosis present

## 2012-04-16 DIAGNOSIS — Z01812 Encounter for preprocedural laboratory examination: Secondary | ICD-10-CM

## 2012-04-16 DIAGNOSIS — M1710 Unilateral primary osteoarthritis, unspecified knee: Secondary | ICD-10-CM | POA: Diagnosis present

## 2012-04-16 DIAGNOSIS — I10 Essential (primary) hypertension: Secondary | ICD-10-CM | POA: Diagnosis present

## 2012-04-16 HISTORY — PX: ROBOT ASSITED LAPAROSCOPIC NEPHROURETERECTOMY: SHX6077

## 2012-04-16 HISTORY — PX: LYMPHADENECTOMY: SHX5960

## 2012-04-16 LAB — ABO/RH: ABO/RH(D): A POS

## 2012-04-16 LAB — BASIC METABOLIC PANEL
CO2: 25 mEq/L (ref 19–32)
Calcium: 9.6 mg/dL (ref 8.4–10.5)
Creatinine, Ser: 1.08 mg/dL (ref 0.50–1.35)
GFR calc Af Amer: 77 mL/min — ABNORMAL LOW (ref >90)
GFR calc non Af Amer: 66 mL/min — ABNORMAL LOW (ref >90)

## 2012-04-16 LAB — HEMOGLOBIN AND HEMATOCRIT, BLOOD: Hemoglobin: 11.9 g/dL — ABNORMAL LOW (ref 13.0–17.0)

## 2012-04-16 LAB — TYPE AND SCREEN: Antibody Screen: NEGATIVE

## 2012-04-16 SURGERY — ROBOT ASSITED LAPAROSCOPIC NEPHROURETERECTOMY
Anesthesia: General | Laterality: Right | Wound class: Clean Contaminated

## 2012-04-16 MED ORDER — DIPHENHYDRAMINE HCL 50 MG/ML IJ SOLN: 12.5000 mg | Freq: Four times a day (QID) | INTRAMUSCULAR | Status: DC | PRN

## 2012-04-16 MED ORDER — BUPIVACAINE LIPOSOME 1.3 % IJ SUSP
20.0000 mL | Freq: Once | INTRAMUSCULAR | Status: AC
  Administered 2012-04-16: 40 mL
  Filled 2012-04-16: qty 20

## 2012-04-16 MED ORDER — LACTATED RINGERS IV SOLN
INTRAVENOUS | Status: DC
  Administered 2012-04-16 (×2): via INTRAVENOUS
  Administered 2012-04-16: 1000 mL via INTRAVENOUS

## 2012-04-16 MED ORDER — DEXTROSE-NACL 5-0.45 % IV SOLN
INTRAVENOUS | Status: DC
  Administered 2012-04-16 – 2012-04-17 (×4): via INTRAVENOUS

## 2012-04-16 MED ORDER — LACTATED RINGERS IR SOLN
Status: DC | PRN
  Administered 2012-04-16: 1000 mL

## 2012-04-16 MED ORDER — ZOLPIDEM TARTRATE 5 MG PO TABS
5.0000 mg | ORAL_TABLET | Freq: Every evening | ORAL | Status: DC | PRN
  Administered 2012-04-17 – 2012-04-21 (×4): 5 mg via ORAL
  Filled 2012-04-16 (×4): qty 1

## 2012-04-16 MED ORDER — CISATRACURIUM BESYLATE (PF) 10 MG/5ML IV SOLN
INTRAVENOUS | Status: DC | PRN
  Administered 2012-04-16: 5 mg via INTRAVENOUS
  Administered 2012-04-16: 4 mg via INTRAVENOUS
  Administered 2012-04-16: 3 mg via INTRAVENOUS
  Administered 2012-04-16: 2 mg via INTRAVENOUS
  Administered 2012-04-16 (×2): 4 mg via INTRAVENOUS
  Administered 2012-04-16: 10 mg via INTRAVENOUS

## 2012-04-16 MED ORDER — DOCUSATE SODIUM 100 MG PO CAPS
100.0000 mg | ORAL_CAPSULE | Freq: Two times a day (BID) | ORAL | Status: DC
  Administered 2012-04-16 – 2012-04-22 (×12): 100 mg via ORAL
  Filled 2012-04-16 (×13): qty 1

## 2012-04-16 MED ORDER — ONDANSETRON HCL 4 MG/2ML IJ SOLN: 4.0000 mg | INTRAMUSCULAR | Status: DC | PRN

## 2012-04-16 MED ORDER — CEFAZOLIN SODIUM 1-5 GM-% IV SOLN
1.0000 g | Freq: Three times a day (TID) | INTRAVENOUS | Status: AC
  Administered 2012-04-16 – 2012-04-17 (×2): 1 g via INTRAVENOUS
  Filled 2012-04-16 (×2): qty 50

## 2012-04-16 MED ORDER — GLYCOPYRROLATE 0.2 MG/ML IJ SOLN
INTRAMUSCULAR | Status: DC | PRN
  Administered 2012-04-16: .6 mg via INTRAVENOUS
  Administered 2012-04-16: 0.4 mg via INTRAVENOUS

## 2012-04-16 MED ORDER — PROPOFOL 10 MG/ML IV BOLUS
INTRAVENOUS | Status: DC | PRN
  Administered 2012-04-16: 180 mg via INTRAVENOUS
  Administered 2012-04-16: 70 mg via INTRAVENOUS

## 2012-04-16 MED ORDER — CEFAZOLIN SODIUM-DEXTROSE 2-3 GM-% IV SOLR
2.0000 g | INTRAVENOUS | Status: AC
  Administered 2012-04-16: 2 g via INTRAVENOUS

## 2012-04-16 MED ORDER — PROMETHAZINE HCL 25 MG/ML IJ SOLN: 6.2500 mg | INTRAMUSCULAR | Status: DC | PRN

## 2012-04-16 MED ORDER — MAGNESIUM CITRATE PO SOLN
1.0000 | Freq: Once | ORAL | Status: DC
  Filled 2012-04-16: qty 296

## 2012-04-16 MED ORDER — LACTATED RINGERS IV SOLN
INTRAVENOUS | Status: DC
  Administered 2012-04-16: 1000 mL via INTRAVENOUS
  Administered 2012-04-16 (×2): via INTRAVENOUS

## 2012-04-16 MED ORDER — ACETAMINOPHEN 10 MG/ML IV SOLN
1000.0000 mg | Freq: Four times a day (QID) | INTRAVENOUS | Status: AC
  Administered 2012-04-16 – 2012-04-17 (×3): 1000 mg via INTRAVENOUS
  Filled 2012-04-16 (×4): qty 100

## 2012-04-16 MED ORDER — FENTANYL CITRATE 0.05 MG/ML IJ SOLN
INTRAMUSCULAR | Status: DC | PRN
  Administered 2012-04-16: 100 ug via INTRAVENOUS
  Administered 2012-04-16 (×3): 50 ug via INTRAVENOUS
  Administered 2012-04-16: 100 ug via INTRAVENOUS
  Administered 2012-04-16 (×6): 50 ug via INTRAVENOUS
  Administered 2012-04-16: 100 ug via INTRAVENOUS

## 2012-04-16 MED ORDER — STERILE WATER FOR IRRIGATION IR SOLN
Status: DC | PRN
  Administered 2012-04-16: 3000 mL

## 2012-04-16 MED ORDER — DEXAMETHASONE SODIUM PHOSPHATE 10 MG/ML IJ SOLN
INTRAMUSCULAR | Status: DC | PRN
  Administered 2012-04-16: 10 mg via INTRAVENOUS

## 2012-04-16 MED ORDER — NEOSTIGMINE METHYLSULFATE 1 MG/ML IJ SOLN
INTRAMUSCULAR | Status: DC | PRN
  Administered 2012-04-16: 4 mg via INTRAVENOUS

## 2012-04-16 MED ORDER — ATROPINE SULFATE 0.4 MG/ML IJ SOLN
INTRAMUSCULAR | Status: DC | PRN
  Administered 2012-04-16: 0.4 mg via INTRAVENOUS

## 2012-04-16 MED ORDER — LACTATED RINGERS IV SOLN: INTRAVENOUS | Status: DC

## 2012-04-16 MED ORDER — LORAZEPAM 0.5 MG PO TABS
0.5000 mg | ORAL_TABLET | Freq: Three times a day (TID) | ORAL | Status: DC | PRN
  Administered 2012-04-16 – 2012-04-17 (×3): 0.5 mg via ORAL
  Filled 2012-04-16 (×3): qty 1

## 2012-04-16 MED ORDER — HYDROMORPHONE HCL PF 1 MG/ML IJ SOLN
0.5000 mg | INTRAMUSCULAR | Status: DC | PRN
  Administered 2012-04-16: 0.5 mg via INTRAVENOUS
  Administered 2012-04-16 – 2012-04-19 (×22): 1 mg via INTRAVENOUS
  Filled 2012-04-16 (×24): qty 1

## 2012-04-16 MED ORDER — PANCRELIPASE (LIP-PROT-AMYL) 12000-38000 UNITS PO CPEP: 2.0000 | ORAL_CAPSULE | Freq: Three times a day (TID) | ORAL | Status: DC

## 2012-04-16 MED ORDER — ATORVASTATIN CALCIUM 20 MG PO TABS
20.0000 mg | ORAL_TABLET | Freq: Every day | ORAL | Status: DC
  Administered 2012-04-16 – 2012-04-21 (×6): 20 mg via ORAL
  Filled 2012-04-16 (×7): qty 1

## 2012-04-16 MED ORDER — DIPHENHYDRAMINE HCL 12.5 MG/5ML PO ELIX: 12.5000 mg | ORAL_SOLUTION | Freq: Four times a day (QID) | ORAL | Status: DC | PRN

## 2012-04-16 MED ORDER — HYDROMORPHONE HCL PF 1 MG/ML IJ SOLN
0.2500 mg | INTRAMUSCULAR | Status: DC | PRN
  Administered 2012-04-16 (×4): 0.5 mg via INTRAVENOUS

## 2012-04-16 MED ORDER — PANCRELIPASE (LIP-PROT-AMYL) 12000-38000 UNITS PO CPEP
2.0000 | ORAL_CAPSULE | Freq: Three times a day (TID) | ORAL | Status: DC
  Administered 2012-04-17 – 2012-04-22 (×15): 2 via ORAL
  Filled 2012-04-16 (×18): qty 2

## 2012-04-16 MED ORDER — ONDANSETRON HCL 4 MG/2ML IJ SOLN
INTRAMUSCULAR | Status: DC | PRN
  Administered 2012-04-16: 4 mg via INTRAVENOUS

## 2012-04-16 SURGICAL SUPPLY — 76 items
APPLICATOR SURGIFLO ENDO (HEMOSTASIS) IMPLANT
CANNULA SEAL DVNC (CANNULA) ×1 IMPLANT
CANNULA SEALS DA VINCI (CANNULA) ×1
CATH FOLEY 3WAY  5CC 18FR (CATHETERS) ×1
CATH FOLEY 3WAY 5CC 18FR (CATHETERS) ×1 IMPLANT
CHLORAPREP W/TINT 26ML (MISCELLANEOUS) ×2 IMPLANT
CLIP LIGATING HEM O LOK PURPLE (MISCELLANEOUS) ×8 IMPLANT
CLIP LIGATING HEMO O LOK GREEN (MISCELLANEOUS) ×2 IMPLANT
CLIP SUT LAPRA TY ABSORB (SUTURE) IMPLANT
CLOTH BEACON ORANGE TIMEOUT ST (SAFETY) ×2 IMPLANT
CORDS BIPOLAR (ELECTRODE) ×2 IMPLANT
COVER SURGICAL LIGHT HANDLE (MISCELLANEOUS) ×2 IMPLANT
COVER TIP SHEARS 8 DVNC (MISCELLANEOUS) ×1 IMPLANT
COVER TIP SHEARS 8MM DA VINCI (MISCELLANEOUS) ×1
CUTTER FLEX LINEAR 45M (STAPLE) IMPLANT
DECANTER SPIKE VIAL GLASS SM (MISCELLANEOUS) IMPLANT
DERMABOND ADVANCED (GAUZE/BANDAGES/DRESSINGS) ×1
DERMABOND ADVANCED .7 DNX12 (GAUZE/BANDAGES/DRESSINGS) ×1 IMPLANT
DRAIN CHANNEL 15F RND FF 3/16 (WOUND CARE) ×2 IMPLANT
DRAPE INCISE IOBAN 66X45 STRL (DRAPES) ×2 IMPLANT
DRAPE LAPAROSCOPIC ABDOMINAL (DRAPES) ×2 IMPLANT
DRAPE LG THREE QUARTER DISP (DRAPES) ×2 IMPLANT
DRAPE TABLE BACK 44X90 PK DISP (DRAPES) ×2 IMPLANT
DRAPE WARM FLUID 44X44 (DRAPE) ×2 IMPLANT
DRESSING SURGICEL FIBRLLR 1X2 (HEMOSTASIS) IMPLANT
DRSG SURGICEL FIBRILLAR 1X2 (HEMOSTASIS)
ELECT REM PT RETURN 9FT ADLT (ELECTROSURGICAL) ×2
ELECTRODE REM PT RTRN 9FT ADLT (ELECTROSURGICAL) ×1 IMPLANT
EVACUATOR SILICONE 100CC (DRAIN) ×2 IMPLANT
GAUZE VASELINE 3X9 (GAUZE/BANDAGES/DRESSINGS) IMPLANT
GLOVE BIOGEL M STRL SZ7.5 (GLOVE) ×4 IMPLANT
GOWN STRL NON-REIN LRG LVL3 (GOWN DISPOSABLE) ×4 IMPLANT
HEMOSTAT SURGICEL 4X8 (HEMOSTASIS) IMPLANT
KIT ACCESSORY DA VINCI DISP (KITS) ×1
KIT ACCESSORY DVNC DISP (KITS) ×1 IMPLANT
KIT BASIN OR (CUSTOM PROCEDURE TRAY) ×2 IMPLANT
LUBRICANT JELLY ST 5GR 8946 (MISCELLANEOUS) IMPLANT
MARKER SKIN DUAL TIP RULER LAB (MISCELLANEOUS) IMPLANT
NS IRRIG 1000ML POUR BTL (IV SOLUTION) IMPLANT
PENCIL BUTTON HOLSTER BLD 10FT (ELECTRODE) ×2 IMPLANT
POSITIONER SURGICAL ARM (MISCELLANEOUS) IMPLANT
POUCH ENDO CATCH II 15MM (MISCELLANEOUS) ×2 IMPLANT
POUCH SPECIMEN RETRIEVAL 10MM (ENDOMECHANICALS) IMPLANT
RELOAD 45 VASCULAR/THIN (ENDOMECHANICALS) IMPLANT
SET CYSTO W/LG BORE CLAMP LF (SET/KITS/TRAYS/PACK) ×2 IMPLANT
SET TUBE IRRIG SUCTION NO TIP (IRRIGATION / IRRIGATOR) ×4 IMPLANT
SOLUTION ANTI FOG 6CC (MISCELLANEOUS) ×2 IMPLANT
SOLUTION ELECTROLUBE (MISCELLANEOUS) ×2 IMPLANT
SPONGE LAP 18X18 X RAY DECT (DISPOSABLE) IMPLANT
SURGIFLO W/THROMBIN 8M KIT (HEMOSTASIS) IMPLANT
SUT ETHILON 3 0 PS 1 (SUTURE) ×2 IMPLANT
SUT MNCRL AB 4-0 PS2 18 (SUTURE) ×4 IMPLANT
SUT MON AB 2-0 SH 27 (SUTURE) IMPLANT
SUT MON AB 2-0 SH27 (SUTURE) IMPLANT
SUT PDS AB 1 CT1 27 (SUTURE) ×4 IMPLANT
SUT PDS AB 1 TP1 96 (SUTURE) ×4 IMPLANT
SUT VIC AB 0 CT1 27 (SUTURE) ×1
SUT VIC AB 0 CT1 27XBRD ANTBC (SUTURE) ×1 IMPLANT
SUT VIC AB 0 UR5 27 (SUTURE) IMPLANT
SUT VIC AB 2-0 SH 27 (SUTURE)
SUT VIC AB 2-0 SH 27X BRD (SUTURE) IMPLANT
SUT VIC AB 4-0 RB1 27 (SUTURE)
SUT VIC AB 4-0 RB1 27XBRD (SUTURE) IMPLANT
SUT VICRYL 0 UR6 27IN ABS (SUTURE) ×8 IMPLANT
SUT VLOC BARB 180 ABS3/0GR12 (SUTURE) ×2
SUTURE VLOC BRB 180 ABS3/0GR12 (SUTURE) ×1 IMPLANT
SYR BULB IRRIGATION 50ML (SYRINGE) IMPLANT
TOWEL OR 17X26 10 PK STRL BLUE (TOWEL DISPOSABLE) ×4 IMPLANT
TOWEL OR NON WOVEN STRL DISP B (DISPOSABLE) ×2 IMPLANT
TRAY FOLEY CATH 14FRSI W/METER (CATHETERS) ×2 IMPLANT
TRAY LAP CHOLE (CUSTOM PROCEDURE TRAY) ×2 IMPLANT
TROCAR ENDOPATH XCEL 12X100 BL (ENDOMECHANICALS) ×2 IMPLANT
TROCAR XCEL 12X100 BLDLESS (ENDOMECHANICALS) ×2 IMPLANT
TUBING INSUFFLATION 10FT LAP (TUBING) ×2 IMPLANT
WATER STERILE IRR 1000ML UROMA (IV SOLUTION) IMPLANT
WATER STERILE IRR 1500ML POUR (IV SOLUTION) IMPLANT

## 2012-04-16 NOTE — Progress Notes (Signed)
Pt c/o pain upper right arm - esp right shoulder area; pt has good ROM to right upper arm, good radial pulse, CMS good; fingers pink; Dr. Rica Mast made aware

## 2012-04-16 NOTE — Anesthesia Postprocedure Evaluation (Signed)
Anesthesia Post Note  Patient: [name]  Procedure(s) Performed: Procedure(s) (LRB): ROBOT ASSITED LAPAROSCOPIC NEPHROURETERECTOMY (Right) LYMPHADENECTOMY (Right)  Anesthesia type: General  Patient location: PACU  Post pain: Pain level controlled  Post assessment: Post-op Vital signs reviewed  Last Vitals:  Filed Vitals:   04/16/12 1715  BP:   Pulse:   Temp: 36.4 C  Resp:     Post vital signs: Reviewed  Level of consciousness: sedated  Complications: No apparent anesthesia complications

## 2012-04-16 NOTE — Anesthesia Preprocedure Evaluation (Addendum)
Anesthesia Evaluation  Patient identified by MRN, date of birth, ID band Patient awake    Reviewed: Allergy & Precautions, H&P , NPO status , Patient's Chart, lab work & pertinent test results  Airway Mallampati: II TM Distance: >3 FB Neck ROM: full    Dental  (+) Poor Dentition, Caps and Dental Advisory Given Multiple caps and implants front top and bottom.:   Pulmonary neg pulmonary ROS,  breath sounds clear to auscultation  Pulmonary exam normal       Cardiovascular hypertension, Pt. 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  PVD   Neuro/Psych negative neurological ROS  negative psych ROS   GI/Hepatic negative GI ROS, Neg liver ROS, (+)     substance abuse  alcohol use, Alcoholic pancreatitis   Endo/Other  negative endocrine ROS  Renal/GU Renal diseasenegative Renal ROS  negative genitourinary   Musculoskeletal   Abdominal   Peds  Hematology negative hematology ROS (+)   Anesthesia Other Findings   Reproductive/Obstetrics negative OB ROS                          Anesthesia Physical Anesthesia Plan  ASA: III  Anesthesia Plan: General   Post-op Pain Management:    Induction: Intravenous  Airway Management Planned: Oral ETT  Additional Equipment:   Intra-op Plan:   Post-operative Plan: Extubation in OR  Informed Consent: I have reviewed the patients History and Physical, chart, labs and discussed the procedure including the risks, benefits and alternatives for the proposed anesthesia with the patient or authorized representative who has indicated his/her understanding and acceptance.     Plan Discussed with: CRNA  Anesthesia Plan Comments:         Anesthesia Quick Evaluation

## 2012-04-16 NOTE — Op Note (Signed)
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 Right robotic-assisted laparoscopic nephroureterectomy 2. Retroperitoneal lymph node dissection  Surgeon: Moody Bruins. M.D.  Assistant(s): Pecola Leisure, PA-C  Anesthesia: General  Complications: None  EBL: 200 mL  IVF:  4700 mL crystalloid  Specimens: 1. Right kidney and ureter with bladder cuff 2. Paracaval lymph nodes 3. Interaortocaval lymph nodes  Disposition of specimens: Pathology  Drains: 1. # 15 Blake perinephric drain 2. 18 Fr Foley catheter  Indication:  [name] is a 71 y.o. 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 He was initially noted to have regional lymphadenopathy concerning for metastatic disease.  He was treated with neoadjuvant chemotherapy with a complete response.  After a thorough review of the management options, he elected to proceed with surgical treatment and the above procedure.  We have discussed the potential benefits and risks of the procedure, side effects of the proposed treatment, the likelihood of the patient achieving the goals of the procedure, and any potential problems that might occur during the procedure or recuperation. Informed consent has been obtained.   Description of procedure:  The patient was taken to the operating room and a general anesthetic was administered. The patient was given preoperative antibiotics, placed in the right modified flank position with care to pad all potential pressure points, and prepped and draped in the usual sterile fashion. Next a preoperative timeout was performed.  A site was selected on the right side of the umbilicus for placement of the camera port. This was placed using a standard open Hassan technique which allowed entry into the peritoneal cavity under direct vision and without  difficulty. He had previously undergone multiple surgeries for an infected gallbladder and there was suspicion that he may have extensive intra-abdominal adhesions. A 12 mm port was placed and a pneumoperitoneum established. The camera was then used to inspect the abdomen and there was no evidence of any intra-abdominal injuries and only minimal adhesions. The remaining abdominal ports were then placed. 8 mm robotic ports were placed in the right upper quadrant, right lower quadrant, and far right lateral abdominal wall. A 12 mm port was placed in the upper midline for laparoscopic assistance. All ports were placed under direct vision without difficulty. The adhesions were taken down sharply with laparoscopic scissors and the surgical cart was then docked.   Utilizing the cautery scissors, the white line of Toldt was incised allowing the colon to be mobilized medially and the plane between the mesocolon and the anterior layer of Gerota's fascia to be developed and the kidney to be exposed. There was an extensive desmoplastic reaction around the kidney and retroperitoneum either from the patient's disease process or chemotherapy effect.  The ureter and gonadal vein were identified inferiorly and the ureter was lifted anteriorly off the psoas muscle.  Dissection proceeded superiorly along the gonadal vein until it entered the inferior vena cava.  The renal hilum was then encountered.  There were two renal veins and two renal arteries which were carefully dissected free from the surrounding structures with a combination of sharp and blunt dissection. The renal arteries were ligated with multiple Weck clips and divided.  The renal veins were then subsequently ligated with multiple Weck clips and divided. Gerota's fascia was then intentionally entered superiorly to spare the adrenal gland and the hepatorenal ligaments were divided.  The lateral attachments to the  kidney were then divided allowing the kidney to be  freely mobile.  Dissection then proceeded inferiorly along the ureter toward the pelvis. The gonadal vein had been ligated superiorly at its insertion into the IVC and was again ligated with Weck clips distally and divided.  The ureter was dissected free down the the common iliac vessels.  At this point attention returned to the renal fossa.  Hemostasis was ensured.  The interaortocaval lymph node packet was then exposed by mobilizing the duodenum medially.  There was a dense post-chemotherapy desmoplastic reaction and a careful dissection was performed allowing removal of the interaortocaval lymph nodes with Weck clips used for hemostasis and a split and roll technique utilized.  The paracaval lymph nodes were similarly removed.    Attention then focused on the pelvic dissection.  The robotic cart was undocked.  An additional 8 mm robotic port was placed in the lower midline and the cart was redocked over the patient's hip. The ureter was further dissected freely into the pelvis after the overlying peritoneum was incised and the small vessels feeding the ureter was ligated with bipolar energy. A Weck clip was placed on the ureter distally to avoid any risk of tumor spillage. The bladder was filled with 100 cc of sterile saline and the ureter could be seen entering into the bladder.  The detrusor muscle fibers were divided and the mucosa could be visualized.  A 3-0 V-lock suture was secured at the lateral side of the margin of resection in the bladder.  The mucosa was then entered and the ureter and a surrounding bladder cuff were removed. The cystotomy was then closed with the v-lock suture in a running fashion with a second imbricating layer as well. The bladder was then filled with 200 cc of saline and there was no evidence for urine leak.  A # 15 Blake drain was then brought through the lateral lower port site and positioned in the perivesical space. The kidney and ureter specimen was placed in an  Endocatch II bag and later removed through a lower midline incision extending from the robotic port site in that area. The lymph node specimens had been removed previously. The 12 mm upper midline incision was closed with a 0-vicryl suture laparoscopically and the camera port site was closed with a figure of eight 0- vicryl suture.  The lower midline extraction site was closed with two running # 1 PDS sutures.  All other laparoscopic/robotic ports were removed under direct vision and the pneumoperitoneum let down with inspection of the operative field performed and hemostasis again confirmed. All incision sites were then injected with local anesthetic and reapproximated at the skin level with 4-0 monocryl subcuticular closures.  Dermabond was applied to the skin.  The patient tolerated the procedure well and without complications.  The patient was able to be extubated and transferred to the recovery unit in satisfactory condition.  Moody Bruins MD

## 2012-04-16 NOTE — Progress Notes (Signed)
Patient ID: [name], male   DOB: Dec 25, 1940, 71 y.o.   MRN: [id number]  Post-op note  Subjective: The patient is doing well.  No complaints.  Objective: Vital signs in last 24 hours: Temp:  [97.4 F (36.3 C)-98.6 F (37 C)] 97.4 F (36.3 C) (12/16 1745) Pulse Rate:  [42-78] 78  (12/16 1745) Resp:  [8-19] 12  (12/16 1745) BP: (128-163)/(64-84) 162/82 mmHg (12/16 1745) SpO2:  [98 %-100 %] 99 % (12/16 1745)  Intake/Output from previous day:   Intake/Output this shift:    Physical Exam:  General: Alert and oriented. Abdomen: Soft, Nondistended. Incisions: Clean and dry.  Lab Results:  Basename 04/16/12 1707  HGB 11.9*  HCT 36.0*    Assessment/Plan: POD#0   1) Continue to monitor   [name]. MD   LOS: 0 days   [name],LES 04/16/2012, 7:10 PM

## 2012-04-16 NOTE — Transfer of Care (Signed)
Immediate Anesthesia Transfer of Care Note  Patient: [name]  Procedure(s) Performed: Procedure(s) (LRB) with comments: ROBOT ASSITED LAPAROSCOPIC NEPHROURETERECTOMY (Right) LYMPHADENECTOMY (Right) - RETROPERITONEAL LYMPHADENECTOMY  Patient Location: PACU  Anesthesia Type:General  Level of Consciousness: awake, oriented, patient cooperative and responds to stimulation  Airway & Oxygen Therapy: Patient Spontanous Breathing and Patient connected to face mask oxygen  Post-op Assessment: Report given to PACU RN, Post -op Vital signs reviewed and stable and Patient moving all extremities X 4  Post vital signs: stable  Complications: No apparent anesthesia complications

## 2012-04-17 ENCOUNTER — Encounter (HOSPITAL_COMMUNITY): Payer: Self-pay | Admitting: Urology

## 2012-04-17 LAB — BASIC METABOLIC PANEL
BUN: 17 mg/dL (ref 6–23)
Calcium: 9.2 mg/dL (ref 8.4–10.5)
Chloride: 99 mEq/L (ref 96–112)
Creatinine, Ser: 1.25 mg/dL (ref 0.50–1.35)
GFR calc Af Amer: 64 mL/min — ABNORMAL LOW (ref >90)

## 2012-04-17 MED ORDER — LORAZEPAM 0.5 MG PO TABS
0.5000 mg | ORAL_TABLET | Freq: Four times a day (QID) | ORAL | Status: DC | PRN
  Administered 2012-04-17 – 2012-04-22 (×14): 0.5 mg via ORAL
  Filled 2012-04-17 (×15): qty 1

## 2012-04-17 MED ORDER — OXYCODONE-ACETAMINOPHEN 5-325 MG PO TABS
1.0000 | ORAL_TABLET | Freq: Four times a day (QID) | ORAL | Status: DC | PRN
  Administered 2012-04-17: 2 via ORAL
  Filled 2012-04-17: qty 2

## 2012-04-17 MED ORDER — OXYCODONE HCL 5 MG PO TABS
5.0000 mg | ORAL_TABLET | Freq: Four times a day (QID) | ORAL | Status: DC | PRN
  Administered 2012-04-17 – 2012-04-18 (×3): 10 mg via ORAL
  Filled 2012-04-17 (×3): qty 2

## 2012-04-17 MED ORDER — DOCUSATE SODIUM 100 MG PO CAPS: 100.0000 mg | ORAL_CAPSULE | Freq: Two times a day (BID) | ORAL | Status: DC

## 2012-04-17 MED ORDER — BISACODYL 10 MG RE SUPP
10.0000 mg | Freq: Once | RECTAL | Status: AC
  Administered 2012-04-17: 10 mg via RECTAL
  Filled 2012-04-17: qty 1

## 2012-04-17 NOTE — Progress Notes (Signed)
Patient ID: [name], male   DOB: 1941-03-23, 71 y.o.   MRN: [id number]  1 Day Post-Op Subjective: He is doing well. Some nausea. No vomiting. Pain controlled.  Objective: Vital signs in last 24 hours: Temp:  [97.4 F (36.3 C)-98.6 F (37 C)] 97.6 F (36.4 C) (12/17 0600) Pulse Rate:  [42-79] 61  (12/17 0600) Resp:  [8-20] 20  (12/17 0600) BP: (115-163)/(64-85) 153/78 mmHg (12/17 0600) SpO2:  [97 %-100 %] 99 % (12/17 0600) Weight:  [89.994 kg (198 lb 6.4 oz)] 89.994 kg (198 lb 6.4 oz) (12/16 2004)  Intake/Output from previous day: 12/16 0701 - 12/17 0700 In: 7316.7 [I.V.:6966.7; IV Piggyback:350] Out: 2785 [Urine:2450; Drains:135; Blood:200] Intake/Output this shift:    Physical Exam:  General: Alert and oriented CV: RRR Lungs: Clear Abdomen: Soft, ND Incisions: C/D/I Ext: NT, No erythema  Lab Results:  [facility] 04/17/12 0542 04/16/12 1707  HGB 11.0* 11.9*  HCT 33.6* 36.0*   BMET  Basename 04/17/12 0542 04/16/12 1707  NA 134* 137  K 4.3 4.1  CL 99 101  CO2 27 25  GLUCOSE 195* 147*  BUN 17 16  CREATININE 1.25 1.08  CALCIUM 9.2 9.6     Studies/Results: No results found.  Assessment/Plan: - Ambulate - IS - Decrease IVF - Advance diet - Monitor renal function - Continue catheter drainage and monitor drain output   LOS: 1 day   Raffi Milstein,LES 04/17/2012, 7:11 AM

## 2012-04-17 NOTE — Progress Notes (Signed)
   CARE MANAGEMENT NOTE 04/17/2012  Patient:  [name], [name]   Account Number:  [id number]  Date Initiated:  04/17/2012  Documentation initiated by:  Jiles Crocker  Subjective/Objective Assessment:   ADMITTED FOR SURGERY -  Right robotic-assisted laparoscopic nephroureterectomy     Action/Plan:   LIVES AT HOME WITH SPOUSE; CM WILL FOLLOW FOR DCP/ FOR HOME NEEDS AT DISCHARGE   Anticipated DC Date:  04/24/2012   Anticipated DC Plan:  HOME/SELF CARE      DC Planning Services  CM consult           Status of service:  In process, will continue to follow Medicare Important Message given?  NA - LOS <3 / Initial given by admissions (If response is "NO", the following Medicare IM given date fields will be blank)  Per UR Regulation:  Reviewed for med. necessity/level of care/duration of stay  Comments:  12/17/2013Abelino Derrick RN,BSN, MHA [phone]

## 2012-04-17 NOTE — Progress Notes (Signed)
Nutrition Brief Note  Patient identified on the Malnutrition Screening Tool (MST) Report  Body mass index is 26.54 kg/(m^2). Pt meets criteria for overweight based on current BMI.   Current diet order is full liquid, patient is consuming approximately 50% of meals at this time. Labs and medications reviewed.   Pt with h/o gross hematuria ultimately found to have malignancy which was treated with chemotherapy. Pt with some wt change related to chemo, but has since regained lost wt.  Currently at usual wt of 195 lbs.  Pt admitted for surgery- nephroureterectomy (12/16). Some nausea post-op but tolerating full liquids with improving intake. Pt is anticipating diet advancement.  At baseline pt consumes soups and sandwiches, grapes, bananas, and melon most days due to being a "picky eater because of chronic pancreatitis." No nutrition interventions warranted at this time. If nutrition issues arise, please consult RD.   Loyce Dys, MS RD LDN Clinical Inpatient Dietitian Pager: [phone] Weekend/After hours pager: [phone]

## 2012-04-18 LAB — BASIC METABOLIC PANEL
BUN: 16 mg/dL (ref 6–23)
CO2: 28 mEq/L (ref 19–32)
Chloride: 101 mEq/L (ref 96–112)
Creatinine, Ser: 1.3 mg/dL (ref 0.50–1.35)
Glucose, Bld: 115 mg/dL — ABNORMAL HIGH (ref 70–99)

## 2012-04-18 MED ORDER — KCL IN DEXTROSE-NACL 20-5-0.45 MEQ/L-%-% IV SOLN
INTRAVENOUS | Status: DC
  Administered 2012-04-18 (×2): via INTRAVENOUS
  Administered 2012-04-19: 75 mL/h via INTRAVENOUS
  Administered 2012-04-19: 21:00:00 via INTRAVENOUS
  Filled 2012-04-18 (×5): qty 1000

## 2012-04-18 MED ORDER — METOCLOPRAMIDE HCL 5 MG/ML IJ SOLN
5.0000 mg | Freq: Four times a day (QID) | INTRAMUSCULAR | Status: AC
  Administered 2012-04-18 – 2012-04-20 (×8): 5 mg via INTRAVENOUS
  Filled 2012-04-18: qty 1
  Filled 2012-04-18: qty 2
  Filled 2012-04-18: qty 1
  Filled 2012-04-18: qty 2
  Filled 2012-04-18: qty 1
  Filled 2012-04-18 (×4): qty 2
  Filled 2012-04-18 (×2): qty 1
  Filled 2012-04-18: qty 2

## 2012-04-18 MED ORDER — OXYCODONE-ACETAMINOPHEN 5-325 MG PO TABS
1.0000 | ORAL_TABLET | Freq: Four times a day (QID) | ORAL | Status: DC | PRN
  Administered 2012-04-18: 1 via ORAL
  Administered 2012-04-18: 2 via ORAL
  Administered 2012-04-18: 1 via ORAL
  Administered 2012-04-19: 2 via ORAL
  Filled 2012-04-18: qty 1
  Filled 2012-04-18: qty 2
  Filled 2012-04-18: qty 1
  Filled 2012-04-18: qty 2

## 2012-04-18 NOTE — Progress Notes (Signed)
Pt experienced 18 beat run of vtach at 0032. Pt asymptomatic and VS stable. Urology on call paged, page not returned. Will continue to monitor. Haitham Dolinsky, Lavone Orn, RN

## 2012-04-18 NOTE — Progress Notes (Signed)
Patient ID: [name], male   DOB: [birth year], 71 y.o.   MRN: [id number]  2 Days Post-Op Subjective: Pt doing well.  No nausea or vomiting.  No flatus. Some abdominal distention and increased pain last night.  Better now. Ambulating well.  Run of ventricular tachycardia last night noted.  Denies CP, SOB, or palpitations.  No hemodynamic changes.  Objective: Vital signs in last 24 hours: Temp:  [97.8 F (36.6 C)-98.4 F (36.9 C)] 98.4 F (36.9 C) (12/18 0448) Pulse Rate:  [57-59] 59  (12/18 0448) Resp:  [18-20] 18  (12/18 0448) BP: (143-167)/(57-76) 167/76 mmHg (12/18 0448) SpO2:  [95 %-98 %] 95 % (12/18 0448)  Intake/Output from previous day: 12/17 0701 - 12/18 0700 In: 2225 [P.O.:360; I.V.:1865] Out: 4980 [Urine:4725; Drains:255] Intake/Output this shift: Total I/O In: 1865 [I.V.:1865] Out: 2595 [Urine:2425; Drains:170]  Physical Exam:  General: Alert and oriented CV: RRR Lungs: Clear Abdomen: Soft, ND, minimal bowel sounds Incisions: C/D/I Ext: NT, No erythema  Lab Results:  [facility] 04/17/12 0542 04/16/12 1707  HGB 11.0* 11.9*  HCT 33.6* 36.0*   BMET  Basename 04/18/12 0555 04/17/12 0542  NA 138 134*  K 3.7 4.3  CL 101 99  CO2 28 27  GLUCOSE 115* 195*  BUN 16 17  CREATININE 1.30 1.25  CALCIUM 9.2 9.2     Studies/Results: Path pending  Assessment/Plan: - Replace electrolytes - Ambulate, IS - Check JP creatinine level - Await return of bowel function - Continue to transition to po pain meds - Advance diet as bowel function returns    LOS: 2 days   Jaeliana Lococo,LES 04/18/2012, 6:55 AM

## 2012-04-19 ENCOUNTER — Inpatient Hospital Stay (HOSPITAL_COMMUNITY): Payer: Medicare Other

## 2012-04-19 LAB — BASIC METABOLIC PANEL
CO2: 26 mEq/L (ref 19–32)
GFR calc non Af Amer: 54 mL/min — ABNORMAL LOW (ref >90)
Glucose, Bld: 110 mg/dL — ABNORMAL HIGH (ref 70–99)
Potassium: 4 mEq/L (ref 3.5–5.1)
Sodium: 136 mEq/L (ref 135–145)

## 2012-04-19 MED ORDER — DIATRIZOATE MEGLUMINE 30 % UR SOLN: Freq: Once | URETHRAL | Status: AC | PRN

## 2012-04-19 MED ORDER — OXYCODONE HCL ER 10 MG PO T12A
10.0000 mg | EXTENDED_RELEASE_TABLET | Freq: Two times a day (BID) | ORAL | Status: DC
  Administered 2012-04-19 – 2012-04-22 (×7): 10 mg via ORAL
  Filled 2012-04-19 (×7): qty 1

## 2012-04-19 MED ORDER — BISACODYL 10 MG RE SUPP
10.0000 mg | Freq: Once | RECTAL | Status: AC
  Administered 2012-04-19: 10 mg via RECTAL
  Filled 2012-04-19: qty 1

## 2012-04-19 MED ORDER — OXYCODONE HCL 5 MG PO TABS
5.0000 mg | ORAL_TABLET | ORAL | Status: DC | PRN
  Administered 2012-04-19 – 2012-04-22 (×16): 5 mg via ORAL
  Filled 2012-04-19 (×17): qty 1

## 2012-04-19 NOTE — Progress Notes (Signed)
Patient ID: [name], male   DOB: 04-30-1941, 71 y.o.   MRN: [id number]  3 Days Post-Op Subjective: Pt without new complaints.  Still no flatus or BM.  Some belching.  Minimal nausea.  He is ambulating well.  Eating a small amount but tolerating it. He has been taking oral and IV pain medication as frequently as he can although according to nursing staff is usually sleeping comfortably in room when they assess him.   Objective: Vital signs in last 24 hours: Temp:  [97.9 F (36.6 C)-98.7 F (37.1 C)] 98.3 F (36.8 C) (12/19 0558) Pulse Rate:  [59-65] 59  (12/19 0558) Resp:  [18-20] 18  (12/19 0558) BP: (127-151)/(66-76) 146/72 mmHg (12/19 0558) SpO2:  [95 %-97 %] 97 % (12/19 0558)  Intake/Output from previous day: 12/18 0701 - 12/19 0700 In: 2060 [P.O.:360; I.V.:1700] Out: 4275 [Urine:3900; Drains:375] Intake/Output this shift: Total I/O In: 880 [I.V.:880] Out: 2385 [Urine:2200; Drains:185]  Physical Exam:  General: Alert and oriented CV: RRR Lungs: Clear Abdomen: Soft, ND, Minimal BS Incisions: C/D/I Ext: NT, No erythema  Lab Results:  [facility] 04/17/12 0542 04/16/12 1707  HGB 11.0* 11.9*  HCT 33.6* 36.0*   BMET  Basename 04/19/12 0520 04/18/12 0555  NA 136 138  K 4.0 3.7  CL 103 101  CO2 26 28  GLUCOSE 110* 115*  BUN 17 16  CREATININE 1.28 1.30  CALCIUM 9.1 9.2   Jp Cr 1.3  Studies/Results: Pathology: pT2 N0 Mx, high grade 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 0/7 lymph nodes involved. Pathology results and implications discussed.  Assessment/Plan: - Cystogram today - Still awaiting bowel function to fully return. On Reglan. Ambulate, IS - Path report discussed as above - Transition to oral pain medication regimen   LOS: 3 days   Billye Nydam,LES 04/19/2012, 7:00 AM

## 2012-04-20 ENCOUNTER — Inpatient Hospital Stay (HOSPITAL_COMMUNITY): Payer: Medicare Other

## 2012-04-20 MED ORDER — OXYCODONE HCL 5 MG PO TABS: 5.0000 mg | ORAL_TABLET | Freq: Four times a day (QID) | ORAL | Status: DC | PRN

## 2012-04-20 MED ORDER — FLEET ENEMA 7-19 GM/118ML RE ENEM
1.0000 | ENEMA | Freq: Once | RECTAL | Status: AC
  Administered 2012-04-20: 1 via RECTAL
  Filled 2012-04-20: qty 1

## 2012-04-20 MED ORDER — POLYETHYLENE GLYCOL 3350 17 G PO PACK
17.0000 g | PACK | Freq: Every day | ORAL | Status: DC
  Administered 2012-04-20 – 2012-04-22 (×3): 17 g via ORAL
  Filled 2012-04-20 (×4): qty 1

## 2012-04-20 MED ORDER — KETOROLAC TROMETHAMINE 15 MG/ML IJ SOLN
15.0000 mg | Freq: Four times a day (QID) | INTRAMUSCULAR | Status: AC
  Administered 2012-04-20 – 2012-04-21 (×4): 15 mg via INTRAVENOUS
  Filled 2012-04-20 (×4): qty 1

## 2012-04-20 MED ORDER — OXYCODONE HCL ER 10 MG PO T12A: 10.0000 mg | EXTENDED_RELEASE_TABLET | Freq: Two times a day (BID) | ORAL | Status: DC

## 2012-04-20 NOTE — Progress Notes (Signed)
Patient ID: [name], male   DOB: 04-05-1941, 71 y.o.   MRN: [id number]  4 Days Post-Op Subjective: Pt complains of left knee pain which began yesterday.  He underwent a cystogram which demonstrated no leak.  While downstairs for his cystogram, he developed severe left knee pain.  He denies trauma or a specific cause of the pain.  He has had left knee pain periodically over the last 40 years related to an old football injury and is followed by Dr. Thurston Hole.  Otherwise, he still has not had flatus or a BM.  No nausea or vomiting. Tolerating diet but still underwhelming PO intake.  Catheter and pelvic drain removed yesterday.  Objective: Vital signs in last 24 hours: Temp:  [98.7 F (37.1 C)-99.2 F (37.3 C)] 98.7 F (37.1 C) (12/20 0439) Pulse Rate:  [67-78] 72  (12/20 0439) Resp:  [17-20] 20  (12/20 0439) BP: (126-152)/(79-88) 146/88 mmHg (12/20 0439) SpO2:  [96 %-99 %] 97 % (12/20 0439)  Intake/Output from previous day: 12/19 0701 - 12/20 0700 In: 1366.3 [I.V.:1206.3] Out: 2525 [Urine:2525] Intake/Output this shift: Total I/O In: 300 [I.V.:300] Out: 1125 [Urine:1125]  Physical Exam:  General: Alert and oriented CV: RRR Lungs: Clear Abdomen: Soft, ND, Positive bowel sounds Inc: C/D/I Ext: NT, No erythema, Left knee swollen compared to right and tender and mildly warm compared to right, No decreased mobility  Lab Results: No results found for this basename: HGB:3,HCT:3 in the last 72 hours BMET  [facility] 04/19/12 0520 04/18/12 0555  NA 136 138  K 4.0 3.7  CL 103 101  CO2 26 28  GLUCOSE 110* 115*  BUN 17 16  CREATININE 1.28 1.30  CALCIUM 9.1 9.2     Studies/Results: Dg Cystogram  04/19/2012  *RADIOLOGY REPORT*  Clinical Data: Nephro ureterectomy and bladder repair.  Rule out bladder leak  CYSTOGRAM  Technique:  After catheterization of the urinary bladder following sterile technique the bladder was filled with 250 cc Cysto-Hypaque 30% by drip infusion.  Serial  spot images were obtained during bladder filling and post draining.  Fluoroscopy Time: 1.4 minutes  Comparison:  CT 02/14/2012  Findings: Indwelling catheter was utilized for the study.  The bladder was filled with contrast . Mild trabeculation is present. No leak was identified with the bladder full.  Postvoid imaging of the bladder reveals no leak of contrast.  There is a surgical drain in the pelvis.  IMPRESSION: Negative for bladder leak.   Original Report Authenticated By: Janeece Riggers, M.D.     Assessment/Plan: - Still awaiting return of bowel function likely delayed due to chronic narcotic use.  Good BS this morning.  Will try laxative and enema. - Will check plain films of left knee, cautious use of anti-inflammatories considering nephrectomy (renal function has been ok) - Disposition depends on issues above   LOS: 4 days   Anabela Crayton,LES 04/20/2012, 6:53 AM

## 2012-04-20 NOTE — Progress Notes (Signed)
Patient ID: [name], male   DOB: May 23, 1940, 72 y.o.   MRN: [id number]   Pt feeling better.  He has had two bowel movements today and is eating better.  His knee x-rays did not demonstrate any acute findings and are consistent with his long history of chronic degenerative changes.  His pain is better and he has been able to ambulate on it although has been benefited by a walker.    A physical therapy consult has been obtained to help determine appropriate needs for discharge.  He does live alone and this will need to be considered.  However, if doing well and his PT needs are addressed, he hopefully can be discharged tomorrow.  He has followup with me next week.

## 2012-04-21 LAB — COMPREHENSIVE METABOLIC PANEL
AST: 14 U/L (ref 0–37)
Albumin: 2.4 g/dL — ABNORMAL LOW (ref 3.5–5.2)
Alkaline Phosphatase: 34 U/L — ABNORMAL LOW (ref 39–117)
BUN: 23 mg/dL (ref 6–23)
Chloride: 99 mEq/L (ref 96–112)
Potassium: 3.5 mEq/L (ref 3.5–5.1)
Total Bilirubin: 0.4 mg/dL (ref 0.3–1.2)

## 2012-04-21 NOTE — Progress Notes (Addendum)
Paged PT per Md. Requesting for evaluation for chronic knee pain.  Patient discharge pending PT recommendations,

## 2012-04-21 NOTE — Progress Notes (Signed)
Patient ID: [name], male   DOB: December 18, 1940, 71 y.o.   MRN: [id number]  5 Days Post-Op Subjective: No acute events. Pain well controlled on PO medications. Negative nausea. Tolerating regular diet. Ambulating with walker; more difficult to ambulate without walker. Knee pain better. Positive flatus and BM.   ROS: Negative SOB  Objective: Vital signs in last 24 hours: Temp:  [97.5 F (36.4 C)-98.5 F (36.9 C)] 97.5 F (36.4 C) (12/21 0555) Pulse Rate:  [50-69] 50  (12/21 0555) Resp:  [18] 18  (12/21 0555) BP: (129-149)/(71-81) 129/78 mmHg (12/21 0555) SpO2:  [96 %] 96 % (12/21 0555)  Intake/Output from previous day: 12/20 0701 - 12/21 0700 In: 880 [P.O.:880] Out: 850 [Urine:850] Intake/Output this shift: Total I/O In: 562 [P.O.:562] Out: -   Physical Exam:  General: Alert and oriented CV: RRR Lungs: Clear Abdomen: Soft, ND, Positive bowel sounds Inc: C/D/I Ext: NT, No erythema, Left knee swollen compared to right and tender and mildly warm compared to right.  Lab Results: No results found for this basename: HGB:3,HCT:3 in the last 72 hours BMET  [facility] 04/21/12 0530 04/19/12 0520  NA 134* 136  K 3.5 4.0  CL 99 103  CO2 25 26  GLUCOSE 113* 110*  BUN 23 17  CREATININE 1.51* 1.28  CALCIUM 8.9 9.1     Studies/Results: Dg Cystogram  04/19/2012  *RADIOLOGY REPORT*  Clinical Data: Nephro ureterectomy and bladder repair.  Rule out bladder leak  CYSTOGRAM  Technique:  After catheterization of the urinary bladder following sterile technique the bladder was filled with 250 cc Cysto-Hypaque 30% by drip infusion.  Serial spot images were obtained during bladder filling and post draining.  Fluoroscopy Time: 1.4 minutes  Comparison:  CT 02/14/2012  Findings: Indwelling catheter was utilized for the study.  The bladder was filled with contrast . Mild trabeculation is present. No leak was identified with the bladder full.  Postvoid imaging of the bladder reveals no leak  of contrast.  There is a surgical drain in the pelvis.  IMPRESSION: Negative for bladder leak.   Original Report Authenticated By: Janeece Riggers, M.D.    Dg Knee Complete 4 Views Left  04/20/2012  *RADIOLOGY REPORT*  Clinical Data: Left knee pain, severe swelling  LEFT KNEE - COMPLETE 4+ VIEW  Comparison: None.  Findings: Four views of the left knee submitted.  Significant degenerative changes noted with diffuse marked narrowing of the joint space.  There is spurring of the tibial plateau and femoral condyles.  There is sclerosis of the articular surface of the tibial plateau.  Significant narrowing of patellofemoral joint space.  Spurring of patella.  Small joint effusion. Atherosclerotic calcifications of the femoral and popliteal artery. No acute fracture or subluxation.  IMPRESSION: No acute fracture or subluxation.  Extensive degenerative changes as described above.   Original Report Authenticated By: Natasha Mead, M.D.     Assessment/Plan: - OK for discharge from surgical point of view. -Await Physical Therapy input. - Disposition depends on issues above   LOS: 5 days   Milford Cage 04/21/2012, 9:21 AM

## 2012-04-21 NOTE — Evaluation (Signed)
Physical Therapy Evaluation Patient Details Name: [name] MRN: [id number] DOB: December 27, 1940 Today's Date: 04/21/2012 Time: 4098-1191 PT Time Calculation (min): 31 min  PT Assessment / Plan / Recommendation Clinical Impression  pt. was admitted on 12/16 with uropathy, s/p nephroureterectomy,lymph node dissecttion for urothelial carcinoma. Pt. lives alone but has exwife who may be able to assist. Recommend HHPT for balance and safety. continue PT while in acute care. Pt was unsteady ambulating without RW. Recommend he use RW    PT Assessment  Patient needs continued PT services    Follow Up Recommendations  Home health PT;Supervision/Assistance - 24 hour    Does the patient have the potential to tolerate intense rehabilitation      Barriers to Discharge Decreased caregiver support      Equipment Recommendations  Rolling walker with 5" wheels (3 in 1)    Recommendations for Other Services OT consult   Frequency Min 3X/week    Precautions / Restrictions Precautions Precautions: Fall   Pertinent Vitals/Pain Mild abd pain      Mobility  Bed Mobility Bed Mobility: Supine to Sit;Sit to Supine Supine to Sit: 7: Independent Sit to Supine: 7: Independent Transfers Transfers: Sit to Stand;Stand to Sit Sit to Stand: 5: Supervision;From chair/3-in-1;With upper extremity assist;From bed;With armrests Stand to Sit: To chair/3-in-1;To bed;With armrests Details for Transfer Assistance: cues for safety Ambulation/Gait Ambulation/Gait Assistance: 4: Min guard;5: Supervision Ambulation Distance (Feet): 400 Feet Assistive device: Rolling walker Ambulation/Gait Assistance Details: pt did try several attempts without  RW but gait unsteady. Pt. has h/o balance issues and has been to OP PT in past per pt.    Shoulder Instructions     Exercises     PT Diagnosis: Difficulty walking;Generalized weakness  PT Problem List: Decreased strength;Decreased activity tolerance;Decreased  balance;Decreased mobility;Decreased safety awareness;Decreased knowledge of precautions;Decreased knowledge of use of DME;Pain PT Treatment Interventions: DME instruction;Gait training;Stair training;Functional mobility training;Therapeutic activities;Therapeutic exercise;Patient/family education;Balance training   PT Goals Acute Rehab PT Goals PT Goal Formulation: With patient Time For Goal Achievement: 05/05/12 Potential to Achieve Goals: Good Pt will go Sit to Stand: with modified independence PT Goal: Sit to Stand - Progress: Goal set today Pt will go Stand to Sit: with modified independence PT Goal: Stand to Sit - Progress: Goal set today Pt will Ambulate: >150 feet;with modified independence;with rolling walker PT Goal: Ambulate - Progress: Goal set today Pt will Go Up / Down Stairs: 1-2 stairs;with supervision;with least restrictive assistive device PT Goal: Up/Down Stairs - Progress: Goal set today  Visit Information  Last PT Received On: 04/21/12 Assistance Needed: +1    Subjective Data  Subjective: They won't let me up by myself Patient Stated Goal: to go home tomorrow.   Prior Functioning  Home Living Lives With: Alone Available Help at Discharge: Family;Available PRN/intermittently (ex wife) Type of Home:  (condo) Home Access: Stairs to enter [employer] of Steps: 2 Home Layout: Two level;Able to live on main level with bedroom/bathroom Bathroom Shower/Tub: Health visitor: Standard Home Adaptive Equipment: None Prior Function Level of Independence: Independent Able to Take Stairs?: Yes Driving: Yes Vocation: Retired Musician: No difficulties    Cognition  Overall Cognitive Status: Appears within functional limits for tasks assessed/performed Arousal/Alertness: Awake/alert Orientation Level: Appears intact for tasks assessed Behavior During Session: [facility] for tasks performed    Extremity/Trunk Assessment Right  Upper Extremity Assessment RUE ROM/Strength/Tone: [facility] for tasks assessed Left Upper Extremity Assessment LUE ROM/Strength/Tone: [facility] for tasks assessed Right Lower Extremity  Assessment RLE ROM/Strength/Tone: Deficits RLE ROM/Strength/Tone Deficits: grossly WFL Left Lower Extremity Assessment LLE ROM/Strength/Tone: Deficits LLE ROM/Strength/Tone Deficits: grossly WFL Trunk Assessment Trunk Assessment: Normal   Balance Static Standing Balance Static Standing - Balance Support: No upper extremity supported Static Standing - Level of Assistance: 5: Stand by assistance Dynamic Standing Balance Dynamic Standing - Level of Assistance: 4: Min assist  End of Session PT - End of Session Activity Tolerance: Patient tolerated treatment well Patient left: in bed;with call bell/phone within reach;with bed alarm set Nurse Communication: Mobility status  GP     Rada Hay 04/21/2012, 1:00 PM  [phone]

## 2012-04-21 NOTE — Progress Notes (Signed)
Placed second page to PT at MD request.

## 2012-04-21 NOTE — Progress Notes (Signed)
CARE MANAGEMENT NOTE 04/21/2012  Patient:  KENDALE, REMBOLD   Account Number:  [id number]  Date Initiated:  04/17/2012  Documentation initiated by:  Jiles Crocker  Subjective/Objective Assessment:   ADMITTED FOR SURGERY -  Right robotic-assisted laparoscopic nephroureterectomy     Action/Plan:   LIVES AT HOME WITH SPOUSE; CM WILL FOLLOW FOR DCP/ FOR HOME NEEDS AT DISCHARGE   Anticipated DC Date:  04/24/2012   Anticipated DC Plan:  HOME W HOME HEALTH SERVICES      DC Planning Services  CM consult      PAC Choice  DURABLE MEDICAL EQUIPMENT  HOME HEALTH   Choice offered to / List presented to:  C-1 Patient   DME arranged  3-N-1  Levan Hurst      DME agency  [facility]     HH arranged  HH-2 PT      [facility] agency  [facility]   Status of service:  In process, will continue to follow Medicare Important Message given?  NA - LOS <3 / Initial given by admissions (If response is "NO", the following Medicare IM given date fields will be blank) Date Medicare IM given:   Date Additional Medicare IM given:    Discharge Disposition:  HOME W HOME HEALTH SERVICES

## 2012-04-21 NOTE — Progress Notes (Signed)
Physical Therapy Treatment Patient Details Name: [name] MRN: [id number] DOB: 08-05-1940 Today's Date: 04/21/2012 Time: 4098-1191 PT Time Calculation (min): 13 min  PT Assessment / Plan / Recommendation Comments on Treatment Session  Pt's gait is steadier with RW. Pt to get one and HHPT    Follow Up Recommendations  Home health PT     Does the patient have the potential to tolerate intense rehabilitation     Barriers to Discharge Decreased caregiver support      Equipment Recommendations  Rolling walker with 5" wheels    Recommendations for Other Services OT consult  Frequency Min 3X/week   Plan Discharge plan remains appropriate;Frequency remains appropriate    Precautions / Restrictions Precautions Precautions: Fall   Pertinent Vitals/Pain No c/o   Mobility  Bed Mobility Bed Mobility: Supine to Sit;Sit to Supine Supine to Sit: 7: Independent Sit to Supine: 7: Independent Transfers Transfers: Sit to Stand;Stand to Sit Sit to Stand: 5: Supervision;From bed Stand to Sit: To bed;7: Independent Details for Transfer Assistance: cues for safety Ambulation/Gait Ambulation/Gait Assistance: 5: Supervision Ambulation Distance (Feet): 800 Feet Assistive device: Rolling walker Ambulation/Gait Assistance Details: used RW x 750 ft. Pt is steadier with RW. Gait Pattern: Step-through pattern    Exercises     PT Diagnosis: Difficulty walking;Generalized weakness  PT Problem List: Decreased strength;Decreased activity tolerance;Decreased balance;Decreased mobility;Decreased safety awareness;Decreased knowledge of precautions;Decreased knowledge of use of DME;Pain PT Treatment Interventions: DME instruction;Gait training;Stair training;Functional mobility training;Therapeutic activities;Therapeutic exercise;Patient/family education;Balance training   PT Goals Acute Rehab PT Goals PT Goal Formulation: With patient Time For Goal Achievement: 05/05/12 Potential to Achieve  Goals: Good Pt will go Sit to Stand: with modified independence PT Goal: Sit to Stand - Progress: Progressing toward goal Pt will go Stand to Sit: with modified independence PT Goal: Stand to Sit - Progress: Met Pt will Ambulate: >150 feet;with modified independence;with rolling walker PT Goal: Ambulate - Progress: Progressing toward goal Pt will Go Up / Down Stairs: 1-2 stairs;with supervision;with least restrictive assistive device PT Goal: Up/Down Stairs - Progress: Goal set today  Visit Information  Last PT Received On: 04/21/12 Assistance Needed: +1    Subjective Data  Subjective: I want to walk as much as I can Patient Stated Goal: to go home tomorrow.   Cognition  Overall Cognitive Status: Appears within functional limits for tasks assessed/performed Arousal/Alertness: Awake/alert Orientation Level: Appears intact for tasks assessed Behavior During Session: [facility] for tasks performed    Balance  Static Standing Balance Static Standing - Balance Support: No upper extremity supported Static Standing - Level of Assistance: 5: Stand by assistance Dynamic Standing Balance Dynamic Standing - Level of Assistance: 4: Min assist  End of Session PT - End of Session Activity Tolerance: Patient tolerated treatment well Patient left: in bed;with call bell/phone within reach;with bed alarm set Nurse Communication: Mobility status   GP     Rada Hay 04/21/2012, 4:31 PM

## 2012-04-22 NOTE — Progress Notes (Signed)
Physical Therapy Treatment Patient Details Name: DAMARCO KEYSOR MRN: [id number] DOB: 1941-03-22 Today's Date: 04/22/2012 Time: 4098-1191 PT Time Calculation (min): 24 min  PT Assessment / Plan / Recommendation Comments on Treatment Session  Pt up in hallway ambulating with nsg tech on arrival.  Pt performed stairs as well as balance activities and standing exercises.  Pt agreeable to HHPT and RW.  Pt educated to use RW at home for safety due to unsteadiness without assistive device.    Follow Up Recommendations  Home health PT     Does the patient have the potential to tolerate intense rehabilitation     Barriers to Discharge        Equipment Recommendations  Rolling walker with 5" wheels    Recommendations for Other Services    Frequency     Plan Discharge plan remains appropriate;Frequency remains appropriate    Precautions / Restrictions Precautions Precautions: Fall   Pertinent Vitals/Pain No pain    Mobility  Bed Mobility Bed Mobility: Sit to Supine Sit to Supine: 7: Independent Transfers Transfers: Sit to Stand;Stand to Sit Sit to Stand: 5: Supervision;From bed Stand to Sit: 5: Supervision;To bed Ambulation/Gait Ambulation/Gait Assistance: 4: Min assist;5: Supervision Ambulation Distance (Feet): 200 Feet (total) Assistive device: Rolling walker Ambulation/Gait Assistance Details: 140 feet with RW and no unsteadiness and verbal cues for posture and RW distance, 40 feet without assistive device and pt very unsteady requiring minA for balance and demonstrates wide BOS Gait Pattern: Step-through pattern Stairs: Yes Stairs Assistance: 4: Min guard Stairs Assistance Details (indicate cue type and reason): pt attempting alternating pattern however discussed step to for safety with up with stronger leg and down with weaker leg, pt also semi-sideways on descent with 2 rails Stair Management Technique: Step to pattern;One rail Right;Forwards Number of Stairs: 10      Exercises General Exercises - Lower Extremity Hip ABduction/ADduction: AROM;Strengthening;Both;10 reps;Standing Hip Flexion/Marching: AROM;Strengthening;Both;10 reps;Standing Other Exercises Other Exercises: hip extension standing x10 bilaterally Other Exercises: all exercises required min assist for balance at times as well as bilateral UE support.   PT Diagnosis:    PT Problem List:   PT Treatment Interventions:     PT Goals Acute Rehab PT Goals PT Goal: Sit to Stand - Progress: Progressing toward goal PT Goal: Stand to Sit - Progress: Progressing toward goal PT Goal: Ambulate - Progress: Progressing toward goal PT Goal: Up/Down Stairs - Progress: Progressing toward goal  Visit Information  Last PT Received On: 04/22/12 Assistance Needed: +1    Subjective Data  Subjective: That was a good work out.   Cognition  Overall Cognitive Status: Appears within functional limits for tasks assessed/performed    Balance  Balance Balance Assessed: Yes High Level Balance High Level Balance Activites: Side stepping High Level Balance Comments: pt performed grapevine for 40 feet bilateral directions, also heel to toe walking (which looked more like narrow BOS walking as pt not good with tandem position) for 100 feet, required bilateral UE support during activities for assisting balance  End of Session PT - End of Session Equipment Utilized During Treatment: Gait belt Activity Tolerance: Patient tolerated treatment well Patient left: in bed;with call bell/phone within reach   GP     Nimisha Rathel,KATHrine E 04/22/2012, 9:34 AM Pager: [phone]

## 2012-04-22 NOTE — Progress Notes (Signed)
04/22/2012 1420 NCM faxed facesheet and orders to [facility] for scheduled d/c today. [facility] delivered DME.  Isidoro Donning RN CCM Case Mgmt phone [phone]

## 2012-04-22 NOTE — Progress Notes (Addendum)
Patient ID: [name], male   DOB: [birth year], 71 y.o.   MRN: [id number]  6 Days Post-Op Subjective: No acute events. Pain continues to be well controlled on current PO regiment. Tolerating regular diet. Positive flatus & BM.  Ambulating better with walker.  Patient evaluated by Physical Therapy: recommended roller walker and home health PT. This has been set up for him.   ROS: Negative SOB  Objective: Vital signs in last 24 hours: Temp:  [97.9 F (36.6 C)-98.2 F (36.8 C)] 98 F (36.7 C) (12/22 0701) Pulse Rate:  [53-78] 53  (12/22 0701) Resp:  [18-20] 18  (12/22 0701) BP: (121-165)/(63-95) 157/66 mmHg (12/22 0701) SpO2:  [95 %-98 %] 98 % (12/22 0701)  Intake/Output from previous day: 12/21 0701 - 12/22 0700 In: 822 [P.O.:822] Out: 725 [Urine:725] Intake/Output this shift:    Physical Exam:  General: Alert and oriented CV: RRR Lungs: Clear Abdomen: Soft, ND, Positive bowel sounds Inc: C/D/I Ext: NT, No erythema, Left knee swollen but unchanged.  Lab Results: No results found for this basename: HGB:3,HCT:3 in the last 72 hours BMET  Basename 04/21/12 0530  NA 134*  K 3.5  CL 99  CO2 25  GLUCOSE 113*  BUN 23  CREATININE 1.51*  CALCIUM 8.9     Studies/Results: Dg Knee Complete 4 Views Left  04/20/2012  *RADIOLOGY REPORT*  Clinical Data: Left knee pain, severe swelling  LEFT KNEE - COMPLETE 4+ VIEW  Comparison: None.  Findings: Four views of the left knee submitted.  Significant degenerative changes noted with diffuse marked narrowing of the joint space.  There is spurring of the tibial plateau and femoral condyles.  There is sclerosis of the articular surface of the tibial plateau.  Significant narrowing of patellofemoral joint space.  Spurring of patella.  Small joint effusion. Atherosclerotic calcifications of the femoral and popliteal artery. No acute fracture or subluxation.  IMPRESSION: No acute fracture or subluxation.  Extensive degenerative changes  as described above.   Original Report Authenticated By: Natasha Mead, M.D.     Assessment/Plan: - Appreciate PT evaluation of patient. - Patient to be discharged home with roller walker and Home Health PT. - I specifically advised the patient not to resume his home narcotics while taking his prescribed narcotics from the hospital.    LOS: 6 days   Milford Cage 04/22/2012, 8:39 AM

## 2012-04-22 NOTE — Discharge Summary (Signed)
Physician Discharge Summary  Patient ID: [name] MRN: [id number] DOB/AGE: April 04, 1941 71 y.o.  Admit date: 04/16/2012 Discharge date: 04/22/2012  Admission Diagnoses: Metastatic urothelial carcinoma  Discharge Diagnoses:  Metastatic urothelial carcinoma  Discharged Condition: fair  Hospital Course:  Patient was admitted following right laparoscopic nephroureterectomy for urothelial carcinoma. He did well post-op and was able to advance his diet to regular. His bowel function returned. His cystogram revealed no bladder leak and his catheter was removed; he was able to void without problems. His left knee began to hurt from an old injury. Plain films of the knee showed not acute injury. Physical therapy was consulted and recommended roller walker and Home Health PT, which was set up for the patient. His pain was able to be controlled with a PO regiment of short and long acting narcotics.   Consults: Physical therapy.  Significant Diagnostic Studies: Cystogram: no urine leak.  Treatments: therapies: PT and surgery: Right robotic nephroureterectomy.  Discharge Exam: Blood pressure 157/66, pulse 53, temperature 98 F (36.7 C), temperature source Oral, resp. rate 18, height 6' 0.5" (1.842 m), weight 89.994 kg (198 lb 6.4 oz), SpO2 98.00%. See PE from progress note on date of discharge.  Disposition: Home Health Physical Therapy.  Discharge Orders    Future Appointments: Provider: Department: Dept Phone: Center:   05/22/2012 10:00 AM Delcie Roch [facility] MEDICAL ONCOLOGY [phone] None   05/22/2012 10:30 AM Benjiman Core, MD [facility] MEDICAL ONCOLOGY [phone] None   08/15/2012 9:00 AM [facility] [phone] [facility]   08/15/2012 9:40 AM Evern Bio, NP [facility] [phone] [facility]     Future Orders Please Complete By Expires   Discharge patient           Medication List     As of 04/22/2012  8:45 AM    STOP taking these medications         HYDROcodone-acetaminophen 7.5-500 MG per tablet   Commonly known as: LORTAB      oxyCODONE-acetaminophen 10-325 MG per tablet   Commonly known as: PERCOCET      vitamin C 500 MG tablet   Commonly known as: ASCORBIC ACID      TAKE these medications         amLODipine 5 MG tablet   Commonly known as: NORVASC   Take 5 mg by mouth every evening.      CREON 12000 UNITS Cpep   Generic drug: lipase/protease/amylase   TAKE 2 CAPSULES 3 TIMES DAILY      lipase/protease/amylase 25366 UNITS Cpep   Commonly known as: CREON-10/PANCREASE   Take 2 capsules by mouth 3 (three) times daily before meals.      docusate sodium 100 MG capsule   Commonly known as: COLACE   Take 100 mg by mouth every evening.      ferrous fumarate 325 (106 FE) MG Tabs   Commonly known as: HEMOCYTE - 106 mg FE   Take 1 tablet by mouth daily as needed. occasionally      hydrocortisone 1 % lotion   Apply 1 application topically 2 (two) times daily as needed. To dry skin      lisinopril 20 MG tablet   Commonly known as: PRINIVIL,ZESTRIL   Take 20 mg by mouth every evening.      LORazepam 1 MG tablet   Commonly known as: ATIVAN   Take 0.5 tablets (0.5 mg total) by  mouth every 8 (eight) hours as needed for anxiety. If  needed      magnesium citrate Soln   Take 1 Bottle by mouth once.      oxyCODONE 5 MG immediate release tablet   Commonly known as: Oxy IR/ROXICODONE   Take 1 tablet (5 mg total) by mouth every 6 (six) hours as needed for pain.      OxyCODONE 10 mg T12a   Commonly known as: OXYCONTIN   Take 1 tablet (10 mg total) by mouth every 12 (twelve) hours.      rosuvastatin 10 MG tablet   Commonly known as: CRESTOR   Take 10 mg by mouth every evening.      senna 8.6 MG Tabs   Commonly known as: SENOKOT   Take 1 tablet by mouth every other day.           Follow-up Information    Follow up with  BORDEN,LES, MD. On 04/24/2012. (at10:00)    Contact information:   [address] [place] [address] [phone]          Signed: Milford Cage 04/22/2012, 8:45 AM

## 2012-04-22 NOTE — Progress Notes (Signed)
Spoke with Case Manager. Per her pt is ready for dc home when equipment arrives. She said she will arrnage his actual home health and face to face will be sent to md office.

## 2012-05-03 ENCOUNTER — Other Ambulatory Visit: Payer: Self-pay | Admitting: Internal Medicine

## 2012-05-03 DIAGNOSIS — R52 Pain, unspecified: Secondary | ICD-10-CM

## 2012-05-03 NOTE — Telephone Encounter (Signed)
Looks like this was d/c with reason of stop taking at discharge.  Annia Belt, PA gave him something else?

## 2012-05-03 NOTE — Telephone Encounter (Signed)
Pt requesting refill on Percocet. Pt states it is the 10mg . Also states that he usually gets 100 pills? Please call when ready for pick up.

## 2012-05-05 NOTE — Telephone Encounter (Signed)
Ok to refill 

## 2012-05-07 ENCOUNTER — Other Ambulatory Visit: Payer: Self-pay | Admitting: Internal Medicine

## 2012-05-07 MED ORDER — OXYCODONE-ACETAMINOPHEN 10-325 MG PO TABS: 1.0000 | ORAL_TABLET | Freq: Four times a day (QID) | ORAL | Status: AC | PRN

## 2012-05-07 MED ORDER — OXYCODONE HCL ER 10 MG PO T12A: 10.0000 mg | EXTENDED_RELEASE_TABLET | Freq: Two times a day (BID) | ORAL | Status: DC

## 2012-05-07 NOTE — Telephone Encounter (Signed)
rx up front ready for p/u, pt aware 

## 2012-05-07 NOTE — Addendum Note (Signed)
Addended by: Alfred Levins D on: 05/07/2012 09:38 AM   Modules accepted: Orders

## 2012-05-13 ENCOUNTER — Other Ambulatory Visit: Payer: Self-pay | Admitting: Internal Medicine

## 2012-05-22 ENCOUNTER — Other Ambulatory Visit (HOSPITAL_BASED_OUTPATIENT_CLINIC_OR_DEPARTMENT_OTHER): Payer: Medicare Other | Admitting: Lab

## 2012-05-22 ENCOUNTER — Telehealth: Payer: Self-pay | Admitting: Oncology

## 2012-05-22 ENCOUNTER — Ambulatory Visit (HOSPITAL_BASED_OUTPATIENT_CLINIC_OR_DEPARTMENT_OTHER): Payer: Medicare Other | Admitting: Oncology

## 2012-05-22 VITALS — BP 143/77 | HR 61 | Temp 97.2°F | Resp 20 | Ht 72.5 in | Wt 195.7 lb

## 2012-05-22 DIAGNOSIS — K5900 Constipation, unspecified: Secondary | ICD-10-CM

## 2012-05-22 DIAGNOSIS — C659 Malignant neoplasm of unspecified renal pelvis: Secondary | ICD-10-CM

## 2012-05-22 DIAGNOSIS — C679 Malignant neoplasm of bladder, unspecified: Secondary | ICD-10-CM

## 2012-05-22 DIAGNOSIS — E119 Type 2 diabetes mellitus without complications: Secondary | ICD-10-CM

## 2012-05-22 LAB — CBC WITH DIFFERENTIAL/PLATELET
BASO%: 0.8 % (ref 0.0–2.0)
EOS%: 2.4 % (ref 0.0–7.0)
LYMPH%: 16.9 % (ref 14.0–49.0)
MCHC: 33.2 g/dL (ref 32.0–36.0)
MCV: 94.8 fL (ref 79.3–98.0)
MONO%: 6.5 % (ref 0.0–14.0)
Platelets: 211 10*3/uL (ref 140–400)
RBC: 3.65 10*6/uL — ABNORMAL LOW (ref 4.20–5.82)
WBC: 6.7 10*3/uL (ref 4.0–10.3)

## 2012-05-22 LAB — COMPREHENSIVE METABOLIC PANEL (CC13)
ALT: 14 U/L (ref 0–55)
Alkaline Phosphatase: 53 U/L (ref 40–150)
Sodium: 140 mEq/L (ref 136–145)
Total Bilirubin: 0.25 mg/dL (ref 0.20–1.20)
Total Protein: 7.2 g/dL (ref 6.4–8.3)

## 2012-05-22 NOTE — Telephone Encounter (Signed)
Gave pt appt for March 2014 lab and Ct then see Md on 07/20/12, gave pt oral contrast

## 2012-05-22 NOTE — Progress Notes (Signed)
Hematology and Oncology Follow Up Visit  [name] [id number] Jan 31, 1941 72 y.o. 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 Pathology showed malignant cells consistent with transitional cell carcinoma (case number [id number]). Patient is S/P  systemic chemotherapy with cisplatin and Gemzar on 12/16/2011. Cisplatin is given on day 1 and Gemzar is given on day 1 and day 8 of a 21 day cycle. He missed cycle 1, day 8 due to poor tolerance. Cycle 2 day 1 was dose reduced by 50%. Therapy completed on 02/24/2012.  He is S/P Right robotic-assisted laparoscopic nephroureterectomy and retroperitoneal lymph node dissection done on 12/16. His Pathology showed T2N0 (0/7 lymph nodes involved).    Current therapy: Observation and follow up.   Interim History:  [name] is a 72 year old gentleman seen for routine followup today. He tolerated the last chemotherapy well, then he did very well with his recent operation. Fatigue is better at this time, he gained weight since his last visit. No nausea or vomiting. Denies fevers, chills, night sweats, chest pain, shortness of breath, abdominal pain. He is finishing with his physical therapy.   Medications: I have reviewed the patient's current medications. Current outpatient prescriptions:amLODipine (NORVASC) 5 MG tablet, Take 5 mg by mouth every evening., Disp: , Rfl: ;  CREON 12000 UNITS CPEP, TAKE 2 CAPSULES 3 TIMES DAILY, Disp: 540 capsule, Rfl: 1;  CRESTOR 10 MG tablet, TAKE 1 TABLET EVERY EVENING, Disp: 90 tablet, Rfl: 1;  docusate sodium (COLACE) 100 MG capsule, Take 100 mg by mouth every evening., Disp: , Rfl:  HYDROcodone-acetaminophen (VICODIN ES) 7.5-750 MG per  tablet, Take 1 tablet by mouth every 6 (six) hours as needed., Disp: , Rfl: ;  lisinopril (PRINIVIL,ZESTRIL) 20 MG tablet, Take 20 mg by mouth every evening. , Disp: , Rfl: ;  LORazepam (ATIVAN) 1 MG tablet, Take 0.5 tablets (0.5 mg total) by mouth every 8 (eight) hours as needed for anxiety. If  needed, Disp: 30 tablet, Rfl: 1 oxyCODONE-acetaminophen (PERCOCET) 10-325 MG per tablet, Take 1 tablet by mouth every 6 (six) hours as needed. Patient takes at night, Disp: , Rfl: ;  rosuvastatin (CRESTOR) 10 MG tablet, Take 10 mg by mouth every evening., Disp: , Rfl: ;  senna (SENOKOT) 8.6 MG TABS, Take 1 tablet by mouth every other day., Disp: , Rfl:   Allergies: No Known Allergies  Past Medical History, Surgical history, Social history, and Family History were reviewed and updated.  Review of Systems: Constitutional:  Negative for fever, chills, night sweats, anorexia, weight loss, pain. Cardiovascular: no chest pain or dyspnea on exertion Respiratory: no shortness of breath, or wheezing Neurological: no TIA or stroke symptoms Dermatological: negative ENT: negative Skin: Negative. Gastrointestinal: no abdominal pain, change in bowel habits, or black or bloody stools positive for - appetite loss, constipation and nausea/vomiting Genito-Urinary: no dysuria, trouble voiding, or hematuria Hematological and Lymphatic: negative Breast: negative for breast lumps Musculoskeletal: negative Remaining ROS negative.  Physical Exam: Blood pressure 143/77, pulse 61, temperature 97.2 F (36.2 C), temperature source Oral, resp. rate 20, height 6' 0.5" (1.842 m), weight 195 lb 11.2 oz (88.769 kg). ECOG: 1 General appearance: alert, cooperative and no distress Head: Normocephalic, without obvious abnormality, atraumatic. Perioral swelling noted since last visit.  Neck: no adenopathy, no carotid bruit, no JVD, supple, symmetrical, trachea midline and thyroid not enlarged, symmetric, no  tenderness/mass/nodules Lymph nodes: Cervical, supraclavicular, and axillary nodes normal. Heart:regular rate and rhythm, S1, S2 normal, no murmur, click, rub or gallop Lung:chest clear, no wheezing, rales, normal symmetric air entry, no tachypnea, retractions or cyanosis Abdomen: soft, non-tender, without masses or organomegaly EXT:no erythema, induration, or nodules   Lab Results: Lab Results  Component Value Date   WBC 6.7 05/22/2012   HGB 11.5* 05/22/2012   HCT 34.6* 05/22/2012   MCV 94.8 05/22/2012   PLT 211 05/22/2012     Chemistry      Component Value Date/Time   NA 140 05/22/2012 1003   NA 134* 04/21/2012 0530   K 4.8 05/22/2012 1003   K 3.5 04/21/2012 0530   CL 106 05/22/2012 1003   CL 99 04/21/2012 0530   CO2 26 05/22/2012 1003   CO2 25 04/21/2012 0530   BUN 18.0 05/22/2012 1003   BUN 23 04/21/2012 0530   CREATININE 1.5* 05/22/2012 1003   CREATININE 1.51* 04/21/2012 0530      Component Value Date/Time   CALCIUM 9.7 05/22/2012 1003   CALCIUM 8.9 04/21/2012 0530   ALKPHOS 53 05/22/2012 1003   ALKPHOS 34* 04/21/2012 0530   AST 19 05/22/2012 1003   AST 14 04/21/2012 0530   ALT 14 05/22/2012 1003   ALT 9 04/21/2012 0530   BILITOT 0.25 05/22/2012 1003   BILITOT 0.4 04/21/2012 0530      Impression and Plan: This is a 72 year old gentleman with the following issues:  1. 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 The patient is S/P systemic chemotherapy with cisplatin and Gemzar. Chemotherapy dose reduced by 50%. He is S/P surgical resection with T2N0 disease. At this point I see no point of further chemotherapy. He will be on observation only. We will repeat the scans in 2 months.   2. Anemia. Due to his chemotherapy. resolved  3. Nausea. Resolved now  4. Constipation. Improved.  5. Followup. In 3/14 after a Ct scan.     Teller Wakefield 1/21/201410:55 AM

## 2012-06-04 ENCOUNTER — Telehealth: Payer: Self-pay | Admitting: Internal Medicine

## 2012-06-04 DIAGNOSIS — C679 Malignant neoplasm of bladder, unspecified: Secondary | ICD-10-CM

## 2012-06-04 MED ORDER — OXYCODONE-ACETAMINOPHEN 10-325 MG PO TABS: 1.0000 | ORAL_TABLET | Freq: Four times a day (QID) | ORAL | Status: DC | PRN

## 2012-06-04 NOTE — Telephone Encounter (Signed)
Patient called stating that he need a refill of his percocet 10-325mg  1poq 6hrs prn for pain. Please assist.

## 2012-06-04 NOTE — Telephone Encounter (Signed)
Dr Cato Mulligan is out of the office, ok per Dr Lovell Sheehan.  rx up front ready for p/u, pt aware

## 2012-06-11 ENCOUNTER — Telehealth: Payer: Self-pay | Admitting: Internal Medicine

## 2012-06-11 DIAGNOSIS — C679 Malignant neoplasm of bladder, unspecified: Secondary | ICD-10-CM

## 2012-06-11 NOTE — Telephone Encounter (Signed)
This was done on 06/04/12, can you see if it is up front for pt?

## 2012-06-11 NOTE — Telephone Encounter (Signed)
Patient called stating that he need a refill of his oxycodone apap 10-325mg . Please assist.

## 2012-06-11 NOTE — Telephone Encounter (Signed)
Patient was only given 30 and told to call back for refill as his MD ws not in at that time.

## 2012-06-12 MED ORDER — OXYCODONE-ACETAMINOPHEN 10-325 MG PO TABS: 1.0000 | ORAL_TABLET | Freq: Four times a day (QID) | ORAL | Status: DC | PRN

## 2012-06-12 NOTE — Telephone Encounter (Signed)
Ok per Dr K, rx up front for p/u, pt aware 

## 2012-06-14 ENCOUNTER — Other Ambulatory Visit: Payer: Self-pay | Admitting: Internal Medicine

## 2012-06-15 ENCOUNTER — Other Ambulatory Visit: Payer: Self-pay | Admitting: Internal Medicine

## 2012-06-20 ENCOUNTER — Encounter: Payer: Self-pay | Admitting: Internal Medicine

## 2012-06-20 ENCOUNTER — Ambulatory Visit (INDEPENDENT_AMBULATORY_CARE_PROVIDER_SITE_OTHER): Payer: Medicare Other | Admitting: Internal Medicine

## 2012-06-20 VITALS — BP 160/94 | HR 92 | Temp 97.9°F | Wt 202.0 lb

## 2012-06-20 DIAGNOSIS — C7919 Secondary malignant neoplasm of other urinary organs: Secondary | ICD-10-CM

## 2012-06-20 DIAGNOSIS — C679 Malignant neoplasm of bladder, unspecified: Secondary | ICD-10-CM

## 2012-06-20 DIAGNOSIS — C801 Malignant (primary) neoplasm, unspecified: Secondary | ICD-10-CM

## 2012-06-20 DIAGNOSIS — I739 Peripheral vascular disease, unspecified: Secondary | ICD-10-CM

## 2012-06-20 DIAGNOSIS — C7910 Secondary malignant neoplasm of unspecified urinary organs: Secondary | ICD-10-CM

## 2012-06-20 DIAGNOSIS — I10 Essential (primary) hypertension: Secondary | ICD-10-CM

## 2012-06-20 DIAGNOSIS — K859 Acute pancreatitis without necrosis or infection, unspecified: Secondary | ICD-10-CM

## 2012-06-20 MED ORDER — HYDROCODONE-ACETAMINOPHEN 7.5-750 MG PO TABS: 1.5000 | ORAL_TABLET | Freq: Four times a day (QID) | ORAL | Status: DC | PRN

## 2012-06-20 MED ORDER — ROSUVASTATIN CALCIUM 10 MG PO TABS: ORAL_TABLET | ORAL | Status: DC

## 2012-06-20 MED ORDER — OXYCODONE-ACETAMINOPHEN 10-325 MG PO TABS: 1.5000 | ORAL_TABLET | Freq: Four times a day (QID) | ORAL | Status: DC | PRN

## 2012-06-20 MED ORDER — PANCRELIPASE (LIP-PROT-AMYL) 12000-38000 UNITS PO CPEP: 2.0000 | ORAL_CAPSULE | Freq: Three times a day (TID) | ORAL | Status: DC

## 2012-06-20 MED ORDER — PANCRELIPASE (LIP-PROT-AMYL) 12000-38000 UNITS PO CPEP: 2.0000 | ORAL_CAPSULE | Freq: Three times a day (TID) | ORAL | Status: AC

## 2012-06-20 NOTE — Assessment & Plan Note (Signed)
Will continue to follow-do not need to aggressively treat. Home bps- 140/80 BP Readings from Last 3 Encounters:  06/20/12 160/94  05/22/12 143/77  04/22/12 157/66

## 2012-06-20 NOTE — Assessment & Plan Note (Signed)
Has been well controlled. Continue narcotics

## 2012-06-20 NOTE — Assessment & Plan Note (Signed)
Has not been an issue. Given his other significant issues his ischemic vasc dzs is not as important.

## 2012-06-20 NOTE — Assessment & Plan Note (Signed)
Has regular f/u with urology and oncology

## 2012-06-20 NOTE — Progress Notes (Signed)
Patient ID: [name], male   DOB: December 31, 1940, 72 y.o.   MRN: [id number] Complicated patient- recent kidney cancer/bladder cancer  Anemia--likely multifactorial  Pancreatitis- no recent f/u. Requires narcotic support  Past Medical History  Diagnosis Date  . Hx of colonic polyps   . Diverticulosis   . Hyperlipidemia   . AAA (abdominal aortic aneurysm) last ct 03-02-11    4.6cm  per note from dr fields w/ chart  . Substance abuse hx alcohol abuse  . Alcohol-induced chronic pancreatitis   . Hypertension stress test 1996- states wnl  . Peripheral vascular disease     occlusion left external iliac artery   . Gross hematuria     freq/ urge/ nocturia  . Arthritis     knees  . Peripheral arterial disease   . Transitional cell carcinoma     kidney, ureter, bladder stage 4    History   Social History  . Marital Status: Divorced    Spouse Name: N/A    Number of Children: 1  . Years of Education: N/A   Occupational History  . Retired-[employer]    Social History Main Topics  . Smoking status: Former Smoker -- 2.00 packs/day for 50 years    Types: Cigarettes    Quit date: 03/11/2003  . Smokeless tobacco: Never Used  . Alcohol Use: No     Comment: hx alcohol abuse-- quit 1995  . Drug Use: No  . Sexually Active: Not on file   Other Topics Concern  . Not on file   Social History Narrative  . No narrative on file    Past Surgical History  Procedure Laterality Date  . Knee surgery  1961    left  . Wrist fracture surgery  2010    orif left wrist  . Cataract extraction w/ intraocular lens  implant, bilateral  1998  . Hernia repair  2010    Left inguinal herniorraphy with mesh  . Cystoscopy w/ retrogrades  03/14/2011    Procedure: CYSTOSCOPY WITH RETROGRADE PYELOGRAM;  Surgeon: Valetta Fuller, MD;  Location: [facility];  Service: Urology;  Laterality: Bilateral;  . Cystoscopy with biopsy  03/14/2011    Procedure: CYSTOSCOPY WITH BIOPSY;  Surgeon:  Valetta Fuller, MD;  Location: [facility];  Service: Urology;  Laterality: N/A;  c arm   . Cystoscopy w/ retrogrades  11/07/2011    Procedure: CYSTOSCOPY WITH RETROGRADE PYELOGRAM;  Surgeon: Valetta Fuller, MD;  Location: [facility];  Service: Urology;  Laterality: Bilateral;  CYSTOSCOPY, (B) RETROGRADE PYELOGRAM, RIGHT FLEXIBLE URETEROSCOPY, RIGHT JJ STENT    . Cystoscopy w/ ureteral stent placement  11/07/2011    Procedure: CYSTOSCOPY WITH STENT REPLACEMENT;  Surgeon: Valetta Fuller, MD;  Location: [facility];  Service: Urology;  Laterality: Right;  . Cholecystectomy   July 1997- laparoscopic    complications of infection, w/ additional surg's 4 minor and 3 major  . Robot assited laparoscopic nephroureterectomy  04/16/2012    Procedure: ROBOT ASSITED LAPAROSCOPIC NEPHROURETERECTOMY;  Surgeon: Crecencio Mc, MD;  Location: [facility];  Service: Urology;  Laterality: Right;  . Lymphadenectomy  04/16/2012    Procedure: LYMPHADENECTOMY;  Surgeon: Crecencio Mc, MD;  Location: [facility];  Service: Urology;  Laterality: Right;  RETROPERITONEAL LYMPHADENECTOMY    Family History  Problem Relation Age of Onset  . Heart disease Mother 55  . Heart attack Father 35  . Heart disease Father   . Hypertension Brother  No Known Allergies  Current Outpatient Prescriptions on File Prior to Visit  Medication Sig Dispense Refill  . amLODipine (NORVASC) 5 MG tablet TAKE 1 TABLET DAILY  90 tablet  1  . CREON 12000 UNITS CPEP TAKE 2 CAPSULES 3 TIMES DAILY  540 capsule  1  . CRESTOR 10 MG tablet TAKE 1 TABLET EVERY EVENING  90 tablet  1  . docusate sodium (COLACE) 100 MG capsule Take 100 mg by mouth every evening.      Marland Kitchen HYDROcodone-acetaminophen (VICODIN ES) 7.5-750 MG per tablet Take 1 tablet by mouth every 6 (six) hours as needed.      Marland Kitchen lisinopril (PRINIVIL,ZESTRIL) 20 MG tablet Take 20 mg by mouth every evening.       Marland Kitchen LORazepam (ATIVAN) 1 MG tablet TAKE ONE-HALF  (1/2) TABLET BY MOUTH EVERY 8 HOURS AS NEEDED FOR ANXIETY  30 tablet  1  . oxyCODONE-acetaminophen (PERCOCET) 10-325 MG per tablet Take 1 tablet by mouth every 6 (six) hours as needed. Patient takes at night  120 tablet  0  . rosuvastatin (CRESTOR) 10 MG tablet Take 10 mg by mouth every evening.      . senna (SENOKOT) 8.6 MG TABS Take 1 tablet by mouth every other day.       No current facility-administered medications on file prior to visit.     patient denies chest pain, shortness of breath, orthopnea. Denies lower extremity edema, abdominal pain, change in appetite, change in bowel movements. Patient denies rashes, musculoskeletal complaints. No other specific complaints in a complete review of systems.   There were no vitals taken for this visit.  well-developed well-nourished male in no acute distress. HEENT exam atraumatic, normocephalic, neck supple without jugular venous distention. Chest clear to auscultation cardiac exam S1-S2 are regular. Abdominal exam overweight with bowel sounds, soft and nontender. Extremities no edema. Neurologic exam is alert with a normal gait.

## 2012-07-13 ENCOUNTER — Ambulatory Visit (HOSPITAL_COMMUNITY)
Admission: RE | Admit: 2012-07-13 | Discharge: 2012-07-13 | Disposition: A | Discharge status: Home / Self Care | Payer: Medicare Other | Source: Ambulatory Visit | Attending: Oncology | Admitting: Oncology

## 2012-07-13 ENCOUNTER — Other Ambulatory Visit (HOSPITAL_BASED_OUTPATIENT_CLINIC_OR_DEPARTMENT_OTHER): Payer: Medicare Other | Admitting: Lab

## 2012-07-13 DIAGNOSIS — C679 Malignant neoplasm of bladder, unspecified: Secondary | ICD-10-CM

## 2012-07-13 DIAGNOSIS — I714 Abdominal aortic aneurysm, without rupture, unspecified: Secondary | ICD-10-CM | POA: Insufficient documentation

## 2012-07-13 DIAGNOSIS — Z905 Acquired absence of kidney: Secondary | ICD-10-CM | POA: Insufficient documentation

## 2012-07-13 DIAGNOSIS — Z9089 Acquired absence of other organs: Secondary | ICD-10-CM | POA: Insufficient documentation

## 2012-07-13 DIAGNOSIS — R599 Enlarged lymph nodes, unspecified: Secondary | ICD-10-CM | POA: Insufficient documentation

## 2012-07-13 DIAGNOSIS — C659 Malignant neoplasm of unspecified renal pelvis: Secondary | ICD-10-CM

## 2012-07-13 LAB — CBC WITH DIFFERENTIAL/PLATELET
Eosinophils Absolute: 0.1 10*3/uL (ref 0.0–0.5)
HCT: 35 % — ABNORMAL LOW (ref 38.4–49.9)
LYMPH%: 13.7 % — ABNORMAL LOW (ref 14.0–49.0)
MCHC: 32.2 g/dL (ref 32.0–36.0)
MCV: 92.8 fL (ref 79.3–98.0)
MONO%: 6.4 % (ref 0.0–14.0)
NEUT#: 7.3 10*3/uL — ABNORMAL HIGH (ref 1.5–6.5)
NEUT%: 78 % — ABNORMAL HIGH (ref 39.0–75.0)
Platelets: 268 10*3/uL (ref 140–400)
RBC: 3.77 10*6/uL — ABNORMAL LOW (ref 4.20–5.82)

## 2012-07-13 LAB — COMPREHENSIVE METABOLIC PANEL (CC13)
Alkaline Phosphatase: 57 U/L (ref 40–150)
Creatinine: 1.5 mg/dL — ABNORMAL HIGH (ref 0.7–1.3)
Glucose: 114 mg/dl — ABNORMAL HIGH (ref 70–99)
Sodium: 139 mEq/L (ref 136–145)
Total Bilirubin: 0.39 mg/dL (ref 0.20–1.20)
Total Protein: 7.5 g/dL (ref 6.4–8.3)

## 2012-07-13 MED ORDER — IOHEXOL 300 MG/ML  SOLN
100.0000 mL | Freq: Once | INTRAMUSCULAR | Status: AC | PRN
  Administered 2012-07-13: 100 mL via INTRAVENOUS

## 2012-07-17 ENCOUNTER — Other Ambulatory Visit: Payer: Medicare Other

## 2012-07-20 ENCOUNTER — Ambulatory Visit (HOSPITAL_BASED_OUTPATIENT_CLINIC_OR_DEPARTMENT_OTHER): Payer: Medicare Other | Admitting: Oncology

## 2012-07-20 ENCOUNTER — Telehealth: Payer: Self-pay | Admitting: Oncology

## 2012-07-20 VITALS — BP 136/80 | HR 70 | Temp 97.1°F | Resp 20 | Ht 72.5 in | Wt 208.1 lb

## 2012-07-20 DIAGNOSIS — R599 Enlarged lymph nodes, unspecified: Secondary | ICD-10-CM

## 2012-07-20 DIAGNOSIS — K5900 Constipation, unspecified: Secondary | ICD-10-CM

## 2012-07-20 DIAGNOSIS — C659 Malignant neoplasm of unspecified renal pelvis: Secondary | ICD-10-CM

## 2012-07-20 DIAGNOSIS — C679 Malignant neoplasm of bladder, unspecified: Secondary | ICD-10-CM

## 2012-07-20 NOTE — Telephone Encounter (Signed)
gv and printed appt schedule for pt for June...gv pt barium....pt has to take someone to the airport 6.12.14...scheduled est for 6.13.14

## 2012-07-20 NOTE — Progress Notes (Signed)
Hematology and Oncology Follow Up Visit  QUANTARIUS GENRICH [id number] 09-Mar-1941 72 y.o. 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  Prior Therapy: Flexible cystoscopy in July 2013.  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 Pathology showed malignant cells consistent with transitional cell carcinoma (case number [id number]). Patient is S/P  systemic chemotherapy with cisplatin and Gemzar on 12/16/2011. Cisplatin is given on day 1 and Gemzar is given on day 1 and day 8 of a 21 day cycle. He missed cycle 1, day 8 due to poor tolerance. Cycle 2 day 1 was dose reduced by 50%. Therapy completed on 02/24/2012.  He is S/P Right robotic-assisted laparoscopic nephroureterectomy and retroperitoneal lymph node dissection done on 12/16. His Pathology showed T2N0 (0/7 lymph nodes involved).   Current therapy: Observation and follow up.   Interim History:  Mr. Kearse is a 72 year old gentleman seen for routine followup today. He tolerated the last chemotherapy well, then he did very well with his recent operation. Fatigue is better at this time, he gained weight since his last visit. No nausea or vomiting. Denies fevers, chills, night sweats, chest pain, shortness of breath, abdominal pain. He is been having more problems with knee arthritis and had recently cortisone  injection.   Medications: I have reviewed the patient's current medications. Current outpatient prescriptions:amLODipine (NORVASC) 5 MG tablet, TAKE 1 TABLET DAILY, Disp: 90 tablet, Rfl: 1;  aspirin 81 MG tablet, Take 81 mg by mouth daily., Disp: , Rfl: ;  docusate sodium (COLACE) 100 MG capsule, Take 100 mg by mouth every evening., Disp: , Rfl: ;  HYDROcodone-acetaminophen (VICODIN ES) 7.5-750 MG per tablet, Take 1.5 tablets by  mouth every 6 (six) hours as needed for pain. Every 4-6 hours prn, Disp: 120 tablet, Rfl: 5 lipase/protease/amylase (CREON) 12000 UNITS CPEP, Take 2 capsules by mouth 3 (three) times daily before meals., Disp: 540 capsule, Rfl: 3;  lisinopril (PRINIVIL,ZESTRIL) 20 MG tablet, Take 20 mg by mouth every evening. , Disp: , Rfl: ;  LORazepam (ATIVAN) 1 MG tablet, TAKE ONE-HALF (1/2) TABLET BY MOUTH EVERY 8 HOURS AS NEEDED FOR ANXIETY, Disp: 30 tablet, Rfl: 1 oxyCODONE-acetaminophen (PERCOCET) 10-325 MG per tablet, Take 1.5 tablets by mouth every 6 (six) hours as needed. Patient takes at night, Disp: 120 tablet, Rfl: 0;  prochlorperazine (COMPAZINE) 10 MG tablet, Take 10 mg by mouth every 6 (six) hours as needed., Disp: , Rfl: ;  rosuvastatin (CRESTOR) 10 MG tablet, TAKE 1 TABLET EVERY EVENING, Disp: 90 tablet, Rfl: 3;  senna (SENOKOT) 8.6 MG TABS, Take 1 tablet by mouth every other day., Disp: , Rfl:   Allergies: No Known Allergies  Past Medical History, Surgical history, Social history, and Family History were reviewed and updated.  Review of Systems: Constitutional:  Negative for fever, chills, night sweats, anorexia, weight loss, pain. Cardiovascular: no chest pain or dyspnea on exertion Respiratory: no shortness of breath, or wheezing Neurological: no TIA or stroke symptoms Dermatological: negative ENT: negative Skin: Negative. Gastrointestinal: no abdominal pain, change in bowel habits, or black or bloody stools positive for - appetite loss, constipation and nausea/vomiting Genito-Urinary: no dysuria, trouble voiding, or hematuria Hematological and Lymphatic: negative Breast: negative for breast lumps Musculoskeletal: negative Remaining ROS negative.  Physical Exam: Blood pressure 136/80, pulse 70, temperature 97.1 F (36.2 C), temperature source Oral, resp.  rate 20, height 6' 0.5" (1.842 m), weight 208 lb 1.6 oz (94.394 kg). ECOG: 1 General appearance: alert, cooperative and no  distress Head: Normocephalic, without obvious abnormality, atraumatic. Perioral swelling noted since last visit.  Neck: no adenopathy, no carotid bruit, no JVD, supple, symmetrical, trachea midline and thyroid not enlarged, symmetric, no tenderness/mass/nodules Lymph nodes: Cervical, supraclavicular, and axillary nodes normal. Heart:regular rate and rhythm, S1, S2 normal, no murmur, click, rub or gallop Lung:chest clear, no wheezing, rales, normal symmetric air entry, no tachypnea, retractions or cyanosis Abdomen: soft, non-tender, without masses or organomegaly EXT:no erythema, induration, or nodules   Lab Results: Lab Results  Component Value Date   WBC 9.4 07/13/2012   HGB 11.2* 07/13/2012   HCT 35.0* 07/13/2012   MCV 92.8 07/13/2012   PLT 268 07/13/2012     Chemistry      Component Value Date/Time   NA 139 07/13/2012 1000   NA 134* 04/21/2012 0530   K 4.6 07/13/2012 1000   K 3.5 04/21/2012 0530   CL 104 07/13/2012 1000   CL 99 04/21/2012 0530   CO2 24 07/13/2012 1000   CO2 25 04/21/2012 0530   BUN 20.1 07/13/2012 1000   BUN 23 04/21/2012 0530   CREATININE 1.5* 07/13/2012 1000   CREATININE 1.51* 04/21/2012 0530      Component Value Date/Time   CALCIUM 9.6 07/13/2012 1000   CALCIUM 8.9 04/21/2012 0530   ALKPHOS 57 07/13/2012 1000   ALKPHOS 34* 04/21/2012 0530   AST 18 07/13/2012 1000   AST 14 04/21/2012 0530   ALT 14 07/13/2012 1000   ALT 9 04/21/2012 0530   BILITOT 0.39 07/13/2012 1000   BILITOT 0.4 04/21/2012 0530     CT CHEST, ABDOMEN AND PELVIS WITH CONTRAST  Technique: Multidetector CT imaging of the chest, abdomen and  pelvis was performed following the standard protocol during bolus  administration of intravenous contrast.  Contrast: OMNIPAQUE IOHEXOL 300 MG/ML SOLN  Comparison: 02/14/2012  CT CHEST  Findings: Lungs/pleura: There is no pleural effusion. No airspace  consolidation or atelectasis. There are no suspicious pulmonary  nodules or masses identified.  Calcified granuloma identified  within the left upper lobe, image number 40/series 6.  Heart/Mediastinum: The heart size is normal. No pericardial  effusion.  Calcifications within the LAD coronary artery noted. There are no  enlarged mediastinal or hilar lymph nodes identified. 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 This  has a maximum diameter of 5.3 cm, image 41/ series 602. This is  unchanged from previous exam.  Bones/Musculoskeletal: No aggressive lytic or sclerotic bone  lesions identified. Mild superior endplate deformities are noted  at T6 and T7. These appear unchanged from previous exam. There  are no aggressive lytic or sclerotic bone lesions.  IMPRESSION:  1. Stable CT of the chest.  2. No specific features identified to suggest residual or  recurrence of tumor.  3. No significant change and thoracic aortic aneurysm.  CT ABDOMEN AND PELVIS  Findings: There is mild diffuse low attenuation within the liver.  No suspicious liver lesion identified. Previous cholecystectomy.  There is pneumobilia consistent with biliary patency. Atrophy of  the pancreas is identified. There are calcifications within the  head and uncinate process of the pancreas. The pancreatic duct is  ectatic. No discrete mass identified. The spleen is normal.  Stable right adrenal gland nodule which measures 1.2 cm, image  62/series two. There is mild nodularity of the left adrenal gland  which is also unchanged, image 62/series 2. There are multiple  "too small to characterize" left renal lesions. Parapelvic cyst  within the inferior pole of the left kidney is unchanged. The  patient is status post right nephrectomy. The urinary bladder  appears within normal limits. Prostate gland and seminal vesicles  are unremarkable.  There is calcified atherosclerotic disease affecting the abdominal  aorta and its branches. Infrarenal abdominal aortic aneurysm has a  AP dimension of  4.9 cm, image 83/series 2. Which is unchanged from  previous exam. Fluid attenuating structure within the ventral  surface of the right psoas muscle is new from previous exam  measuring 3.5 cm, image 78. This is favored to represent either a  postoperative seroma or lymphocele.  Enlarged retroperitoneal lymph nodes are identified. Index  aortocaval lymph node measures 1.4 cm, image 72/series 2. This is  compared with 0.5 cm previously. Periaortic lymph node measures  1.3 cm, image 76/series 2. This is compared with 0.5 cm  previously. Just below the level of the aortic aneurysm there is a  precaval lymph node which measures 1.3 cm, image 87/series 2.  Previously 0.7 cm. There are no enlarged iliac or inguinal lymph  nodes.  No free fluid or fluid collections identified within the abdomen or  pelvis.  The stomach and duodenum appear normal. The small bowel loops have  a normal course and caliber without evidence for obstruction. The  appendix is visualized and appears normal. Normal appearance of  the colon.  Review of the visualized bony structures is significant for  multilevel lumbar degenerative disc disease. No worrisome lytic or  sclerotic bone lesions identified.  IMPRESSION:  1. Interval development of multiple enlarged retroperitoneal lymph  nodes. These are worrisome for metastatic adenopathy.  2. Status post right nephrectomy.  3. Stable changes of prior cholecystectomy with pneumobilia.    Impression and Plan: This is a 72 year old gentleman with the following issues:  1. 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 The patient is S/P systemic chemotherapy with cisplatin and Gemzar. Chemotherapy dose reduced by 50%. He is S/P surgical resection with T2N0 disease.  CT scan results discussed today and did show enlarged pelvic lymph nodes suspicious for cancer.  I gave him the option to treat now, biopsy, or repeat scan in 2-3 months. He elected to repeat scan  and based on these results we will determine the course of action.    2. Anemia. Due to his chemotherapy. resolved  3. Nausea. Resolved now  4. Constipation. Improved.  5. Followup. I6/2014 and repeat scan then.     Camaryn Lumbert 3/21/201410:19 AM

## 2012-07-26 ENCOUNTER — Other Ambulatory Visit: Payer: Self-pay | Admitting: Internal Medicine

## 2012-07-27 ENCOUNTER — Other Ambulatory Visit: Payer: Self-pay

## 2012-07-27 DIAGNOSIS — I714 Abdominal aortic aneurysm, without rupture: Secondary | ICD-10-CM

## 2012-08-08 ENCOUNTER — Telehealth: Payer: Self-pay | Admitting: Internal Medicine

## 2012-08-08 MED ORDER — OXYCODONE-ACETAMINOPHEN 10-325 MG PO TABS: 1.5000 | ORAL_TABLET | Freq: Four times a day (QID) | ORAL | Status: DC | PRN

## 2012-08-08 NOTE — Telephone Encounter (Signed)
rx up front ready for p/u, pt aware 

## 2012-08-08 NOTE — Telephone Encounter (Signed)
PT requesting refill of his oxyCODONE-acetaminophen (PERCOCET) 10-325 MG per tablet, please assist.

## 2012-08-13 ENCOUNTER — Encounter (INDEPENDENT_AMBULATORY_CARE_PROVIDER_SITE_OTHER): Payer: Medicare Other | Admitting: *Deleted

## 2012-08-13 DIAGNOSIS — I714 Abdominal aortic aneurysm, without rupture: Secondary | ICD-10-CM

## 2012-08-14 ENCOUNTER — Other Ambulatory Visit: Payer: Self-pay | Admitting: *Deleted

## 2012-08-14 DIAGNOSIS — I714 Abdominal aortic aneurysm, without rupture: Secondary | ICD-10-CM

## 2012-08-15 ENCOUNTER — Ambulatory Visit: Payer: Medicare Other | Admitting: Neurosurgery

## 2012-08-16 ENCOUNTER — Encounter: Payer: Self-pay | Admitting: Vascular Surgery

## 2012-09-05 ENCOUNTER — Other Ambulatory Visit: Payer: Self-pay | Admitting: *Deleted

## 2012-09-05 ENCOUNTER — Telehealth: Payer: Self-pay | Admitting: Internal Medicine

## 2012-09-05 MED ORDER — HYDROCODONE-ACETAMINOPHEN 7.5-325 MG PO TABS: 1.5000 | ORAL_TABLET | Freq: Four times a day (QID) | ORAL | Status: DC | PRN

## 2012-09-05 MED ORDER — OXYCODONE-ACETAMINOPHEN 10-325 MG PO TABS: 1.5000 | ORAL_TABLET | Freq: Four times a day (QID) | ORAL | Status: DC | PRN

## 2012-09-05 NOTE — Telephone Encounter (Signed)
rx up front ready for p/u, pt aware 

## 2012-09-05 NOTE — Telephone Encounter (Signed)
PT called to request a refill of his oxyCODONE-acetaminophen (PERCOCET) 10-325 MG per tablet. Please assist.

## 2012-09-16 ENCOUNTER — Encounter (HOSPITAL_COMMUNITY): Payer: Self-pay | Admitting: Anesthesiology

## 2012-09-16 ENCOUNTER — Emergency Department (HOSPITAL_COMMUNITY): Payer: Medicare Other

## 2012-09-16 ENCOUNTER — Inpatient Hospital Stay (HOSPITAL_COMMUNITY): Payer: Medicare Other | Admitting: Anesthesiology

## 2012-09-16 ENCOUNTER — Encounter (HOSPITAL_COMMUNITY): Payer: Self-pay | Admitting: Emergency Medicine

## 2012-09-16 ENCOUNTER — Inpatient Hospital Stay (HOSPITAL_COMMUNITY): Payer: Medicare Other

## 2012-09-16 ENCOUNTER — Inpatient Hospital Stay: Admit: 2012-09-16 | Payer: Self-pay | Admitting: Orthopedic Surgery

## 2012-09-16 ENCOUNTER — Inpatient Hospital Stay (HOSPITAL_COMMUNITY)
Admission: EM | Admit: 2012-09-16 | Discharge: 2012-09-20 | DRG: 480 | Disposition: A | Discharge status: Skilled Nursing Facility | Payer: Medicare Other | Attending: Internal Medicine | Admitting: Internal Medicine

## 2012-09-16 ENCOUNTER — Encounter (HOSPITAL_COMMUNITY)
Admission: EM | Disposition: A | Discharge status: Skilled Nursing Facility | Payer: Self-pay | Source: Home / Self Care | Attending: Family Medicine

## 2012-09-16 DIAGNOSIS — Z79899 Other long term (current) drug therapy: Secondary | ICD-10-CM

## 2012-09-16 DIAGNOSIS — S72033A Displaced midcervical fracture of unspecified femur, initial encounter for closed fracture: Principal | ICD-10-CM | POA: Diagnosis present

## 2012-09-16 DIAGNOSIS — C679 Malignant neoplasm of bladder, unspecified: Secondary | ICD-10-CM

## 2012-09-16 DIAGNOSIS — M1710 Unilateral primary osteoarthritis, unspecified knee: Secondary | ICD-10-CM | POA: Diagnosis present

## 2012-09-16 DIAGNOSIS — C649 Malignant neoplasm of unspecified kidney, except renal pelvis: Secondary | ICD-10-CM | POA: Diagnosis present

## 2012-09-16 DIAGNOSIS — Z87891 Personal history of nicotine dependence: Secondary | ICD-10-CM

## 2012-09-16 DIAGNOSIS — Z7982 Long term (current) use of aspirin: Secondary | ICD-10-CM

## 2012-09-16 DIAGNOSIS — R296 Repeated falls: Secondary | ICD-10-CM | POA: Diagnosis present

## 2012-09-16 DIAGNOSIS — C7910 Secondary malignant neoplasm of unspecified urinary organs: Secondary | ICD-10-CM

## 2012-09-16 DIAGNOSIS — Z860100 Personal history of colon polyps, unspecified: Secondary | ICD-10-CM

## 2012-09-16 DIAGNOSIS — I10 Essential (primary) hypertension: Secondary | ICD-10-CM | POA: Diagnosis present

## 2012-09-16 DIAGNOSIS — I714 Abdominal aortic aneurysm, without rupture, unspecified: Secondary | ICD-10-CM | POA: Diagnosis present

## 2012-09-16 DIAGNOSIS — S62001A Unspecified fracture of navicular [scaphoid] bone of right wrist, initial encounter for closed fracture: Secondary | ICD-10-CM

## 2012-09-16 DIAGNOSIS — J189 Pneumonia, unspecified organism: Secondary | ICD-10-CM | POA: Diagnosis present

## 2012-09-16 DIAGNOSIS — K5730 Diverticulosis of large intestine without perforation or abscess without bleeding: Secondary | ICD-10-CM

## 2012-09-16 DIAGNOSIS — E785 Hyperlipidemia, unspecified: Secondary | ICD-10-CM

## 2012-09-16 DIAGNOSIS — I129 Hypertensive chronic kidney disease with stage 1 through stage 4 chronic kidney disease, or unspecified chronic kidney disease: Secondary | ICD-10-CM | POA: Diagnosis present

## 2012-09-16 DIAGNOSIS — S62009A Unspecified fracture of navicular [scaphoid] bone of unspecified wrist, initial encounter for closed fracture: Secondary | ICD-10-CM | POA: Diagnosis present

## 2012-09-16 DIAGNOSIS — K859 Acute pancreatitis without necrosis or infection, unspecified: Secondary | ICD-10-CM | POA: Diagnosis present

## 2012-09-16 DIAGNOSIS — N189 Chronic kidney disease, unspecified: Secondary | ICD-10-CM | POA: Diagnosis present

## 2012-09-16 DIAGNOSIS — C669 Malignant neoplasm of unspecified ureter: Secondary | ICD-10-CM | POA: Diagnosis present

## 2012-09-16 DIAGNOSIS — D649 Anemia, unspecified: Secondary | ICD-10-CM | POA: Diagnosis present

## 2012-09-16 DIAGNOSIS — K861 Other chronic pancreatitis: Secondary | ICD-10-CM

## 2012-09-16 DIAGNOSIS — D62 Acute posthemorrhagic anemia: Secondary | ICD-10-CM | POA: Diagnosis not present

## 2012-09-16 DIAGNOSIS — D72829 Elevated white blood cell count, unspecified: Secondary | ICD-10-CM | POA: Diagnosis present

## 2012-09-16 DIAGNOSIS — S72009A Fracture of unspecified part of neck of unspecified femur, initial encounter for closed fracture: Secondary | ICD-10-CM

## 2012-09-16 DIAGNOSIS — W102XXA Fall (on)(from) incline, initial encounter: Secondary | ICD-10-CM | POA: Diagnosis present

## 2012-09-16 DIAGNOSIS — R110 Nausea: Secondary | ICD-10-CM

## 2012-09-16 DIAGNOSIS — S72001A Fracture of unspecified part of neck of right femur, initial encounter for closed fracture: Secondary | ICD-10-CM

## 2012-09-16 DIAGNOSIS — M25469 Effusion, unspecified knee: Secondary | ICD-10-CM | POA: Diagnosis present

## 2012-09-16 DIAGNOSIS — R1013 Epigastric pain: Secondary | ICD-10-CM

## 2012-09-16 DIAGNOSIS — I739 Peripheral vascular disease, unspecified: Secondary | ICD-10-CM

## 2012-09-16 HISTORY — PX: HIP PINNING,CANNULATED: SHX1758

## 2012-09-16 LAB — CBC WITH DIFFERENTIAL/PLATELET
Basophils Absolute: 0 10*3/uL (ref 0.0–0.1)
HCT: 31.1 % — ABNORMAL LOW (ref 39.0–52.0)
Hemoglobin: 9.8 g/dL — ABNORMAL LOW (ref 13.0–17.0)
Lymphocytes Relative: 5 % — ABNORMAL LOW (ref 12–46)
Lymphs Abs: 0.6 10*3/uL — ABNORMAL LOW (ref 0.7–4.0)
MCV: 93.4 fL (ref 78.0–100.0)
Monocytes Absolute: 0.7 10*3/uL (ref 0.1–1.0)
Neutro Abs: 10.9 10*3/uL — ABNORMAL HIGH (ref 1.7–7.7)
RBC: 3.33 MIL/uL — ABNORMAL LOW (ref 4.22–5.81)
RDW: 14.9 % (ref 11.5–15.5)
WBC: 12.2 10*3/uL — ABNORMAL HIGH (ref 4.0–10.5)

## 2012-09-16 LAB — COMPREHENSIVE METABOLIC PANEL
ALT: 14 U/L (ref 0–53)
AST: 28 U/L (ref 0–37)
CO2: 23 mEq/L (ref 19–32)
Chloride: 101 mEq/L (ref 96–112)
Creatinine, Ser: 1.23 mg/dL (ref 0.50–1.35)
GFR calc Af Amer: 65 mL/min — ABNORMAL LOW (ref >90)
GFR calc non Af Amer: 56 mL/min — ABNORMAL LOW (ref >90)
Glucose, Bld: 121 mg/dL — ABNORMAL HIGH (ref 70–99)
Sodium: 135 mEq/L (ref 135–145)
Total Bilirubin: 0.2 mg/dL — ABNORMAL LOW (ref 0.3–1.2)

## 2012-09-16 LAB — TYPE AND SCREEN: Antibody Screen: NEGATIVE

## 2012-09-16 SURGERY — FIXATION, FEMUR, NECK, PERCUTANEOUS, USING SCREW
Anesthesia: General | Site: Hip | Laterality: Right | Wound class: Clean

## 2012-09-16 MED ORDER — DOCUSATE SODIUM 100 MG PO CAPS
100.0000 mg | ORAL_CAPSULE | Freq: Every evening | ORAL | Status: DC
  Administered 2012-09-16 – 2012-09-19 (×4): 100 mg via ORAL
  Filled 2012-09-16 (×5): qty 1

## 2012-09-16 MED ORDER — HYDROMORPHONE HCL PF 1 MG/ML IJ SOLN
INTRAMUSCULAR | Status: AC
  Filled 2012-09-16: qty 1

## 2012-09-16 MED ORDER — LORAZEPAM 1 MG PO TABS
1.5000 mg | ORAL_TABLET | Freq: Three times a day (TID) | ORAL | Status: DC | PRN
  Administered 2012-09-16 – 2012-09-19 (×5): 1.5 mg via ORAL
  Filled 2012-09-16 (×6): qty 1

## 2012-09-16 MED ORDER — CEFAZOLIN SODIUM-DEXTROSE 2-3 GM-% IV SOLR: 2.0000 g | Freq: Three times a day (TID) | INTRAVENOUS | Status: DC

## 2012-09-16 MED ORDER — LACTATED RINGERS IV SOLN
INTRAVENOUS | Status: DC | PRN
  Administered 2012-09-16: 09:00:00 via INTRAVENOUS

## 2012-09-16 MED ORDER — PROMETHAZINE HCL 25 MG/ML IJ SOLN: 6.2500 mg | INTRAMUSCULAR | Status: DC | PRN

## 2012-09-16 MED ORDER — HYDROCODONE-ACETAMINOPHEN 7.5-325 MG PO TABS
1.5000 | ORAL_TABLET | Freq: Four times a day (QID) | ORAL | Status: DC | PRN
  Administered 2012-09-17 (×2): 1.5 via ORAL
  Filled 2012-09-16 (×2): qty 2

## 2012-09-16 MED ORDER — LACTATED RINGERS IV SOLN: INTRAVENOUS | Status: AC

## 2012-09-16 MED ORDER — ONDANSETRON HCL 4 MG/2ML IJ SOLN
INTRAMUSCULAR | Status: DC | PRN
  Administered 2012-09-16: 4 mg via INTRAVENOUS

## 2012-09-16 MED ORDER — METHOCARBAMOL 100 MG/ML IJ SOLN
500.0000 mg | Freq: Four times a day (QID) | INTRAVENOUS | Status: DC | PRN
  Administered 2012-09-16: 500 mg via INTRAVENOUS
  Filled 2012-09-16: qty 5

## 2012-09-16 MED ORDER — ONDANSETRON HCL 4 MG PO TABS: 4.0000 mg | ORAL_TABLET | Freq: Four times a day (QID) | ORAL | Status: DC | PRN

## 2012-09-16 MED ORDER — HYDROMORPHONE HCL PF 1 MG/ML IJ SOLN
INTRAMUSCULAR | Status: DC | PRN
  Administered 2012-09-16 (×2): 1 mg via INTRAVENOUS

## 2012-09-16 MED ORDER — HYDROMORPHONE HCL PF 2 MG/ML IJ SOLN
2.0000 mg | INTRAMUSCULAR | Status: DC | PRN
  Administered 2012-09-16 – 2012-09-17 (×10): 2 mg via INTRAVENOUS
  Filled 2012-09-16 (×11): qty 1

## 2012-09-16 MED ORDER — LACTATED RINGERS IV SOLN: INTRAVENOUS | Status: DC

## 2012-09-16 MED ORDER — PROCHLORPERAZINE MALEATE 10 MG PO TABS
10.0000 mg | ORAL_TABLET | Freq: Four times a day (QID) | ORAL | Status: DC | PRN
  Filled 2012-09-16: qty 1

## 2012-09-16 MED ORDER — LEVOFLOXACIN IN D5W 750 MG/150ML IV SOLN
750.0000 mg | INTRAVENOUS | Status: DC
  Administered 2012-09-16: 750 mg via INTRAVENOUS
  Filled 2012-09-16 (×2): qty 150

## 2012-09-16 MED ORDER — HYDROMORPHONE HCL PF 1 MG/ML IJ SOLN
1.0000 mg | Freq: Once | INTRAMUSCULAR | Status: AC
  Administered 2012-09-16: 1 mg via INTRAVENOUS
  Filled 2012-09-16: qty 1

## 2012-09-16 MED ORDER — HYDROMORPHONE HCL PF 1 MG/ML IJ SOLN
1.0000 mg | INTRAMUSCULAR | Status: DC | PRN
  Administered 2012-09-16: 1 mg via INTRAVENOUS

## 2012-09-16 MED ORDER — CEFAZOLIN SODIUM-DEXTROSE 2-3 GM-% IV SOLR
INTRAVENOUS | Status: AC
  Filled 2012-09-16: qty 50

## 2012-09-16 MED ORDER — POTASSIUM CHLORIDE IN NACL 20-0.9 MEQ/L-% IV SOLN
INTRAVENOUS | Status: DC
  Administered 2012-09-16 – 2012-09-17 (×2): via INTRAVENOUS
  Filled 2012-09-16 (×3): qty 1000

## 2012-09-16 MED ORDER — METOCLOPRAMIDE HCL 5 MG/ML IJ SOLN: 5.0000 mg | Freq: Three times a day (TID) | INTRAMUSCULAR | Status: DC | PRN

## 2012-09-16 MED ORDER — ASPIRIN EC 325 MG PO TBEC
325.0000 mg | DELAYED_RELEASE_TABLET | Freq: Every day | ORAL | Status: DC
  Filled 2012-09-16 (×2): qty 1

## 2012-09-16 MED ORDER — MORPHINE SULFATE 2 MG/ML IJ SOLN: 0.5000 mg | INTRAMUSCULAR | Status: DC | PRN

## 2012-09-16 MED ORDER — AMLODIPINE BESYLATE 5 MG PO TABS
5.0000 mg | ORAL_TABLET | Freq: Every morning | ORAL | Status: DC
  Administered 2012-09-16 – 2012-09-18 (×3): 5 mg via ORAL
  Filled 2012-09-16 (×3): qty 1

## 2012-09-16 MED ORDER — POTASSIUM CHLORIDE IN NACL 20-0.9 MEQ/L-% IV SOLN
INTRAVENOUS | Status: AC
  Filled 2012-09-16: qty 1000

## 2012-09-16 MED ORDER — ONDANSETRON HCL 4 MG/2ML IJ SOLN: 4.0000 mg | Freq: Three times a day (TID) | INTRAMUSCULAR | Status: AC | PRN

## 2012-09-16 MED ORDER — METHOCARBAMOL 500 MG PO TABS
500.0000 mg | ORAL_TABLET | Freq: Four times a day (QID) | ORAL | Status: DC | PRN
  Administered 2012-09-16 – 2012-09-19 (×4): 500 mg via ORAL
  Filled 2012-09-16 (×4): qty 1

## 2012-09-16 MED ORDER — ACETAMINOPHEN 10 MG/ML IV SOLN
INTRAVENOUS | Status: DC | PRN
  Administered 2012-09-16: 1000 mg via INTRAVENOUS

## 2012-09-16 MED ORDER — MENTHOL 3 MG MT LOZG
1.0000 | LOZENGE | OROMUCOSAL | Status: DC | PRN
  Filled 2012-09-16: qty 9

## 2012-09-16 MED ORDER — FENTANYL CITRATE 0.05 MG/ML IJ SOLN
INTRAMUSCULAR | Status: DC | PRN
  Administered 2012-09-16 (×2): 50 ug via INTRAVENOUS

## 2012-09-16 MED ORDER — PANCRELIPASE (LIP-PROT-AMYL) 12000-38000 UNITS PO CPEP
2.0000 | ORAL_CAPSULE | Freq: Three times a day (TID) | ORAL | Status: DC
  Administered 2012-09-17 – 2012-09-20 (×9): 2 via ORAL
  Filled 2012-09-16 (×14): qty 2

## 2012-09-16 MED ORDER — ATORVASTATIN CALCIUM 20 MG PO TABS
20.0000 mg | ORAL_TABLET | Freq: Every day | ORAL | Status: DC
  Administered 2012-09-16 – 2012-09-19 (×4): 20 mg via ORAL
  Filled 2012-09-16 (×5): qty 1

## 2012-09-16 MED ORDER — ACETAMINOPHEN 10 MG/ML IV SOLN
INTRAVENOUS | Status: AC
  Filled 2012-09-16: qty 100

## 2012-09-16 MED ORDER — PROPOFOL 10 MG/ML IV BOLUS
INTRAVENOUS | Status: DC | PRN
  Administered 2012-09-16: 150 mg via INTRAVENOUS

## 2012-09-16 MED ORDER — OXYCODONE HCL 5 MG PO TABS
7.5000 mg | ORAL_TABLET | Freq: Four times a day (QID) | ORAL | Status: DC | PRN
  Administered 2012-09-16 – 2012-09-17 (×4): 7.5 mg via ORAL
  Filled 2012-09-16 (×2): qty 2
  Filled 2012-09-16: qty 1
  Filled 2012-09-16: qty 2
  Filled 2012-09-16: qty 1

## 2012-09-16 MED ORDER — ONDANSETRON HCL 4 MG/2ML IJ SOLN
4.0000 mg | Freq: Once | INTRAMUSCULAR | Status: AC
  Administered 2012-09-16: 4 mg via INTRAVENOUS
  Filled 2012-09-16: qty 2

## 2012-09-16 MED ORDER — FENTANYL CITRATE 0.05 MG/ML IJ SOLN
75.0000 ug | Freq: Once | INTRAMUSCULAR | Status: AC
  Administered 2012-09-16: 75 ug via INTRAVENOUS
  Filled 2012-09-16: qty 2

## 2012-09-16 MED ORDER — HYDROMORPHONE HCL PF 1 MG/ML IJ SOLN
0.2500 mg | INTRAMUSCULAR | Status: DC | PRN
  Administered 2012-09-16 (×2): 0.5 mg via INTRAVENOUS

## 2012-09-16 MED ORDER — ENOXAPARIN SODIUM 40 MG/0.4ML ~~LOC~~ SOLN
40.0000 mg | SUBCUTANEOUS | Status: DC
  Administered 2012-09-16 – 2012-09-19 (×4): 40 mg via SUBCUTANEOUS
  Filled 2012-09-16 (×5): qty 0.4

## 2012-09-16 MED ORDER — FENTANYL CITRATE 0.05 MG/ML IJ SOLN
50.0000 ug | Freq: Once | INTRAMUSCULAR | Status: AC
  Administered 2012-09-16: 50 ug via INTRAVENOUS
  Filled 2012-09-16: qty 2

## 2012-09-16 MED ORDER — ONDANSETRON HCL 4 MG/2ML IJ SOLN: 4.0000 mg | Freq: Four times a day (QID) | INTRAMUSCULAR | Status: DC | PRN

## 2012-09-16 MED ORDER — CEFAZOLIN SODIUM-DEXTROSE 2-3 GM-% IV SOLR
INTRAVENOUS | Status: DC | PRN
  Administered 2012-09-16: 2 g via INTRAVENOUS

## 2012-09-16 MED ORDER — LIDOCAINE HCL (CARDIAC) 20 MG/ML IV SOLN
INTRAVENOUS | Status: DC | PRN
  Administered 2012-09-16: 50 mg via INTRAVENOUS

## 2012-09-16 MED ORDER — METOCLOPRAMIDE HCL 10 MG PO TABS: 5.0000 mg | ORAL_TABLET | Freq: Three times a day (TID) | ORAL | Status: DC | PRN

## 2012-09-16 MED ORDER — PHENOL 1.4 % MT LIQD
1.0000 | OROMUCOSAL | Status: DC | PRN
  Filled 2012-09-16: qty 177

## 2012-09-16 MED ORDER — SUCCINYLCHOLINE CHLORIDE 20 MG/ML IJ SOLN
INTRAMUSCULAR | Status: DC | PRN
  Administered 2012-09-16: 100 mg via INTRAVENOUS

## 2012-09-16 MED ORDER — SENNA 8.6 MG PO TABS
1.0000 | ORAL_TABLET | ORAL | Status: DC
  Administered 2012-09-17 – 2012-09-19 (×2): 8.6 mg via ORAL
  Filled 2012-09-16 (×2): qty 1

## 2012-09-16 MED ORDER — 0.9 % SODIUM CHLORIDE (POUR BTL) OPTIME
TOPICAL | Status: DC | PRN
  Administered 2012-09-16: 1000 mL

## 2012-09-16 MED ORDER — LISINOPRIL 20 MG PO TABS
20.0000 mg | ORAL_TABLET | Freq: Every evening | ORAL | Status: DC
  Administered 2012-09-16 – 2012-09-19 (×4): 20 mg via ORAL
  Filled 2012-09-16 (×5): qty 1

## 2012-09-16 MED ORDER — OXYCODONE-ACETAMINOPHEN 10-325 MG PO TABS: 1.5000 | ORAL_TABLET | Freq: Four times a day (QID) | ORAL | Status: DC | PRN

## 2012-09-16 MED ORDER — OXYCODONE-ACETAMINOPHEN 5-325 MG PO TABS
1.5000 | ORAL_TABLET | Freq: Four times a day (QID) | ORAL | Status: DC | PRN
  Administered 2012-09-16: 1.5 via ORAL
  Filled 2012-09-16: qty 2

## 2012-09-16 SURGICAL SUPPLY — 31 items
BAG ZIPLOCK 12X15 (MISCELLANEOUS) ×2 IMPLANT
BANDAGE GAUZE ELAST BULKY 4 IN (GAUZE/BANDAGES/DRESSINGS) ×2 IMPLANT
BIT DRILL 5 ACE CANN QC (BIT) ×2 IMPLANT
CLOTH BEACON ORANGE TIMEOUT ST (SAFETY) ×2 IMPLANT
DRAPE STERI IOBAN 125X83 (DRAPES) ×2 IMPLANT
DRSG MEPILEX BORDER 4X4 (GAUZE/BANDAGES/DRESSINGS) ×2 IMPLANT
DURAPREP 26ML APPLICATOR (WOUND CARE) ×2 IMPLANT
ELECT REM PT RETURN 9FT ADLT (ELECTROSURGICAL) ×2
ELECTRODE REM PT RTRN 9FT ADLT (ELECTROSURGICAL) ×1 IMPLANT
GAUZE XEROFORM 1X8 LF (GAUZE/BANDAGES/DRESSINGS) ×2 IMPLANT
GLOVE INDICATOR 8.0 STRL GRN (GLOVE) ×2 IMPLANT
GLOVE SURG ORTHO 8.0 STRL STRW (GLOVE) ×2 IMPLANT
GOWN STRL NON-REIN LRG LVL3 (GOWN DISPOSABLE) ×2 IMPLANT
NS IRRIG 1000ML POUR BTL (IV SOLUTION) ×2 IMPLANT
PACK GENERAL/GYN (CUSTOM PROCEDURE TRAY) ×2 IMPLANT
PAD CAST 4YDX4 CTTN HI CHSV (CAST SUPPLIES) ×1 IMPLANT
PADDING CAST COTTON 4X4 STRL (CAST SUPPLIES) ×1
PIN THREADED GUIDE ACE (PIN) ×4 IMPLANT
POSITIONER SURGICAL ARM (MISCELLANEOUS) ×4 IMPLANT
SCREW CANN 110X22X6.5 (Screw) ×2 IMPLANT
SCREW CANN 22X6.5X100 (Screw) ×1 IMPLANT
SCREW CANN 6.5 100MM (Screw) ×1 IMPLANT
SCREW CANN 6.5 110MM (Screw) ×2 IMPLANT
STRIP CLOSURE SKIN 1/2X4 (GAUZE/BANDAGES/DRESSINGS) IMPLANT
SUT ETHILON 3 0 PS 1 (SUTURE) ×2 IMPLANT
SUT MNCRL AB 3-0 PS2 18 (SUTURE) IMPLANT
SUT VIC AB 1 CT1 36 (SUTURE) IMPLANT
SUT VIC AB 2-0 CT1 27 (SUTURE) ×2
SUT VIC AB 2-0 CT1 TAPERPNT 27 (SUTURE) ×2 IMPLANT
TOWEL OR 17X26 10 PK STRL BLUE (TOWEL DISPOSABLE) ×2 IMPLANT
TRAY FOLEY CATH 14FRSI W/METER (CATHETERS) IMPLANT

## 2012-09-16 NOTE — Anesthesia Postprocedure Evaluation (Signed)
  Anesthesia Post-op Note  Patient: [name]  Procedure(s) Performed: Procedure(s): CANNULATED HIP PINNING (Right)  Patient Location: PACU  Anesthesia Type:General  Level of Consciousness: awake, alert , oriented and patient cooperative  Airway and Oxygen Therapy: Patient Spontanous Breathing and Patient connected to nasal cannula oxygen  Post-op Pain: mild  Post-op Assessment: Post-op Vital signs reviewed, Patient's Cardiovascular Status Stable, Respiratory Function Stable, Patent Airway, No signs of Nausea or vomiting, Adequate PO intake and Pain level controlled  Post-op Vital Signs: Reviewed and stable  Complications: No apparent anesthesia complications

## 2012-09-16 NOTE — Preoperative (Signed)
Beta Blockers   Reason not to administer Beta Blockers:Not Applicable 

## 2012-09-16 NOTE — Anesthesia Preprocedure Evaluation (Addendum)
Anesthesia Evaluation  Patient identified by MRN, date of birth, ID band Patient awake    Reviewed: Allergy & Precautions, H&P , NPO status , Patient's Chart, lab work & pertinent test results  Airway Mallampati: II TM Distance: >3 FB Neck ROM: full    Dental  (+) Poor Dentition, Caps and Dental Advisory Given Multiple caps and implants front top and bottom.:   Pulmonary neg pulmonary ROS, former smoker,  breath sounds clear to auscultation  Pulmonary exam normal       Cardiovascular hypertension, Pt. 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  PVD   Neuro/Psych negative neurological ROS  negative psych ROS   GI/Hepatic negative GI ROS, Neg liver ROS, (+)     substance abuse  alcohol use, Alcoholic pancreatitis   Endo/Other  negative endocrine ROS  Renal/GU Renal diseasenegative Renal ROSNephrectomy 04/2012  negative genitourinary   Musculoskeletal   Abdominal   Peds  Hematology negative hematology ROS (+)   Anesthesia Other Findings Patient rather lethargic this AM;probably secondary to pain medication.  Reproductive/Obstetrics negative OB ROS                          Anesthesia Physical Anesthesia Plan  ASA: III  Anesthesia Plan: General   Post-op Pain Management:    Induction: Intravenous  Airway Management Planned: Oral ETT  Additional Equipment:   Intra-op Plan:   Post-operative Plan: Extubation in OR  Informed Consent: I have reviewed the patients History and Physical, chart, labs and discussed the procedure including the risks, benefits and alternatives for the proposed anesthesia with the patient or authorized representative who has indicated his/her understanding and acceptance.   Dental advisory given  Plan Discussed with: CRNA  Anesthesia Plan Comments:         Anesthesia Quick Evaluation

## 2012-09-16 NOTE — ED Notes (Signed)
Pt arrived from home by EMS with a complaint of a fall.  Pt got up to answer the phone but forgot to turn on the light.  Pt ran into the wall, lost his balance and fell on his right side injuring his right wrist and right hip and leg.

## 2012-09-16 NOTE — ED Provider Notes (Signed)
History     CSN: [id number]  Arrival date & time 09/16/12  0344   First MD Initiated Contact with Patient 09/16/12 [phone]      Chief Complaint  Patient presents with  . Fall   HPI  ,[name] is a 72 y.o. 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 Presents with fall. Patient says he tripped fell against the wall and tried to grab onto something but there was nothing there, patient fell onto his outstretched right hand and onto his right hip. He says he did hit his head and has a questionable loss of consciousness-he says he's just not sure. He has pain in his hand it is severe, sharp, well localized, throbbing, not alleviated so far with fentanyl. Patient also says he has pain in his right hip there is mild to moderate, worsened on hip flexion he is concerned he has broken his hip. Patient is active still, plays golf, last played on Friday playing 9 holes and shot a 44.   Past Medical History  Diagnosis Date  . Hx of colonic polyps   . Diverticulosis   . Hyperlipidemia   . AAA (abdominal aortic aneurysm) last ct 03-02-11    4.6cm  per note from dr fields w/ chart  . Substance abuse hx alcohol abuse  . Alcohol-induced chronic pancreatitis   . Hypertension stress test 1996- states wnl  . Peripheral vascular disease     occlusion left external iliac artery   . Gross hematuria     freq/ urge/ nocturia  . Arthritis     knees  . Peripheral arterial disease   . Transitional cell carcinoma     kidney, ureter, bladder stage 4    Past Surgical History  Procedure Laterality Date  . Knee surgery  1961    left  . Wrist fracture surgery  2010    orif left wrist  . Cataract extraction w/ intraocular lens  implant, bilateral  1998  . Hernia repair  2010    Left inguinal herniorraphy with mesh  . Cystoscopy w/ retrogrades  03/14/2011    Procedure: CYSTOSCOPY WITH RETROGRADE  PYELOGRAM;  Surgeon: Valetta Fuller, MD;  Location: [facility];  Service: Urology;  Laterality: Bilateral;  . Cystoscopy with biopsy  03/14/2011    Procedure: CYSTOSCOPY WITH BIOPSY;  Surgeon: Valetta Fuller, MD;  Location: [facility];  Service: Urology;  Laterality: N/A;  c arm   . Cystoscopy w/ retrogrades  11/07/2011    Procedure: CYSTOSCOPY WITH RETROGRADE PYELOGRAM;  Surgeon: Valetta Fuller, MD;  Location: [facility];  Service: Urology;  Laterality: Bilateral;  CYSTOSCOPY, (B) RETROGRADE PYELOGRAM, RIGHT FLEXIBLE URETEROSCOPY, RIGHT JJ STENT    . Cystoscopy w/ ureteral stent placement  11/07/2011    Procedure: CYSTOSCOPY WITH STENT REPLACEMENT;  Surgeon: Valetta Fuller, MD;  Location: [facility];  Service: Urology;  Laterality: Right;  . Cholecystectomy   July 1997- laparoscopic    complications of infection, w/ additional surg's 4 minor and 3 major  . Robot assited laparoscopic nephroureterectomy  04/16/2012    Procedure: ROBOT ASSITED LAPAROSCOPIC NEPHROURETERECTOMY;  Surgeon: Crecencio Mc, MD;  Location: [facility];  Service: Urology;  Laterality: Right;  . Lymphadenectomy  04/16/2012    Procedure: LYMPHADENECTOMY;  Surgeon: Crecencio Mc, MD;  Location: [facility];  Service: Urology;  Laterality: Right;  RETROPERITONEAL LYMPHADENECTOMY  Family History  Problem Relation Age of Onset  . Heart disease Mother 19  . Heart attack Father 68  . Heart disease Father   . Hypertension Brother     History  Substance Use Topics  . Smoking status: Former Smoker -- 2.00 packs/day for 50 years    Types: Cigarettes    Quit date: 03/11/2003  . Smokeless tobacco: Never Used  . Alcohol Use: No     Comment: hx alcohol abuse-- quit 1995      Review of Systems At least 10pt or greater review of systems completed and are negative except where specified in the HPI.  Allergies  Review of patient's allergies indicates no known allergies.  Home  Medications   Current Outpatient Rx  Name  Route  Sig  Dispense  Refill  . amLODipine (NORVASC) 5 MG tablet   Oral   Take 5 mg by mouth every morning.         Marland Kitchen aspirin 81 MG tablet   Oral   Take 81 mg by mouth every morning.          . docusate sodium (COLACE) 100 MG capsule   Oral   Take 100 mg by mouth every evening.         Marland Kitchen HYDROcodone-acetaminophen (NORCO) 7.5-325 MG per tablet   Oral   Take 1.5 tablets by mouth every 6 (six) hours as needed for pain.   100 tablet   5   . lipase/protease/amylase (CREON) 12000 UNITS CPEP   Oral   Take 2 capsules by mouth 3 (three) times daily before meals.   540 capsule   3   . lisinopril (PRINIVIL,ZESTRIL) 20 MG tablet   Oral   Take 20 mg by mouth every evening.          Marland Kitchen LORazepam (ATIVAN) 1 MG tablet   Oral   Take 1.5 mg by mouth every 8 (eight) hours as needed for anxiety.          Marland Kitchen oxyCODONE-acetaminophen (PERCOCET) 10-325 MG per tablet   Oral   Take 1.5 tablets by mouth every 6 (six) hours as needed for pain. Patient takes at night         . prochlorperazine (COMPAZINE) 10 MG tablet   Oral   Take 10 mg by mouth every 6 (six) hours as needed (for nausea).          . rosuvastatin (CRESTOR) 10 MG tablet   Oral   Take 10 mg by mouth every evening.         . senna (SENOKOT) 8.6 MG TABS   Oral   Take 1 tablet by mouth every other day.           BP 119/62  Pulse 62  Temp(Src) 98.1 F (36.7 C) (Oral)  Resp 16  SpO2 93%  Physical Exam  Nursing notes reviewed.  Electronic medical record reviewed. VITAL SIGNS:   Filed Vitals:   09/16/12 0357 09/16/12 0613 09/16/12 0731 09/16/12 0835  BP: 119/62 134/69 145/64 161/56  Pulse: 62 65 76 84  Temp: 98.1 F (36.7 C) 98.3 F (36.8 C) 98.8 F (37.1 C)   TempSrc: Oral Oral Oral   Resp: 16   15  SpO2: 93% 92% 92% 95%   CONSTITUTIONAL: Awake, oriented, appears non-toxic HENT: Atraumatic, normocephalic, oral mucosa pink and moist, airway patent.  Nares patent without drainage. External ears normal. EYES: Conjunctiva clear, EOMI, PERRLA NECK: Trachea midline, non-tender, supple CARDIOVASCULAR: Normal heart rate, Normal  rhythm, No murmurs, rubs, gallops PULMONARY/CHEST: Clear to auscultation, no rhonchi, wheezes, or rales. Symmetrical breath sounds. Non-tender. ABDOMINAL: Non-distended, soft, tenderness to palpation in the epigastrium without rebound or.  BS normal. NEUROLOGIC: Non-focal, moving all four extremities, no gross sensory or motor deficits. EXTREMITIES: No clubbing, cyanosis. Pain and swelling over the right scaphoid, tenderness in the anatomic snuff box, neurovascularly intact. Patient has pain to right hip flexion at 10, tender to palpation in the anterior central inguinal region. SKIN: Warm, Dry, No erythema, No rash  ED Course  Procedures (including critical care time)  Date: 09/16/2012  Rate: 73  Rhythm: sinus rhythm, PACs  QRS Axis: normal  Intervals: normal  ST/T Wave abnormalities: normal  Conduction Disutrbances: Right bundle branch block  Narrative Interpretation: unremarkable, nonischemic EKG, PACs are new since prior EKG dated 12/26/2011  Labs Reviewed  CBC WITH DIFFERENTIAL - Abnormal; Notable for the following:    WBC 12.2 (*)    RBC 3.33 (*)    Hemoglobin 9.8 (*)    HCT 31.1 (*)    Neutrophils Relative % 90 (*)    Neutro Abs 10.9 (*)    Lymphocytes Relative 5 (*)    Lymphs Abs 0.6 (*)    All other components within normal limits  COMPREHENSIVE METABOLIC PANEL - Abnormal; Notable for the following:    Glucose, Bld 121 (*)    Albumin 3.1 (*)    Total Bilirubin 0.2 (*)    GFR calc non Af Amer 56 (*)    GFR calc Af Amer 65 (*)    All other components within normal limits   Dg Wrist Complete Right  09/16/2012   *RADIOLOGY REPORT*  Clinical Data: Fall.  Right wrist pain.  Limited range of motion.  RIGHT WRIST - COMPLETE 3+ VIEW  Comparison: None.  Findings: Nondisplaced fracture of the midbody of  the scaphoid is best observed on the dedicated scaphoid view.  Old fracture or unfused secondary ossification center of the ulnar styloid noted.  Slight irregularity of the radial styloid is probably incidental and less likely to represent radial styloid fracture.  Linear lucency projecting posteriorly over the proximal carpal row may be related to the scaphoid fracture or conceivably a small triquetral fracture.  IMPRESSION:  1.  Nondisplaced acute fracture of the midbody of the scaphoid. 2.  Questionable posterior triquetral fracture. 3.  Slightly irregular radial styloid is probably intact. 4.  Old fracture or unfused secondary ossification center of the ulnar styloid.   Original Report Authenticated By: Gaylyn Rong, M.D.   Dg Hip Complete Right  09/16/2012   *RADIOLOGY REPORT*  Clinical Data: Fall.  Right hip pain.  RIGHT HIP - COMPLETE 2+ VIEW  Comparison: 07/13/2012  Findings: Asymmetric degenerative arthropathy of the right hip noted with loss of articular space and asymmetric right femoral head spurring, similar to that shown on 07/13/2012.  The degree of spurring at the junction of the femoral head and neck reduces sensitivity for nondisplaced fracture.  No definite fracture observed.  IMPRESSION:  1.  Asymmetric osteoarthritis of the right hip as shown on prior exam from March.  No definite fracture, but the degree of spurring along the junction of the femoral head and neck might reduce sensitivity for subtle fractures.  If the patient is unable to bear weight, MRI or CT would likely be warranted.   Original Report Authenticated By: Gaylyn Rong, M.D.     1. Closed right hip fracture, initial encounter   2. Scaphoid fracture of wrist,  right, closed, initial encounter   3. Chronic pancreatitis   4. Epigastric pain   5. Nausea       MDM  Patient presents status post fall, he has a subcapital fracture of the right hip, also has a nondisplaced scaphoid fracture on the right hand.  Continue pain control, no evidence of distant for acute pancreatitis although his pancreas may be burned out. Patient is mildly anemic but I do not think will require a blood transfusion at this time.  Discussed patient with Dr. August Saucer from orthopedics who will evaluate patient for screws in the Right hip, continue to keep the patient n.p.o.  Discussed with Dr. Blake Divine from [facility] to admit patient primarily.  Patient does have a significant opioid tolerance 2/2 to pancreatitis and arthritis pain, may have difficulty controlling his pain with PRN dosing  Jones Skene, MD 09/16/12 1041

## 2012-09-16 NOTE — Brief Op Note (Signed)
09/16/2012  11:11 AM  PATIENT:  [name]  72 y.o. male  PRE-OPERATIVE DIAGNOSIS:  right femoral neck fracture  POST-OPERATIVE DIAGNOSIS:  right femoral neck fracture  PROCEDURE:  Procedure(s): CANNULATED HIP PINNING  SURGEON:  Surgeon(s): Cammy Copa, MD  ASSISTANT:   ANESTHESIA:   general  EBL: 10 ml    Total I/O In: -  Out: 25 [Blood:25]  BLOOD ADMINISTERED: none  DRAINS: none   LOCAL MEDICATIONS USED:  none  SPECIMEN:  No Specimen  COUNTS:  YES  TOURNIQUET:  * No tourniquets in log *  DICTATION: .Other Dictation: Dictation Number [id number]  PLAN OF CARE: Admit to inpatient   PATIENT DISPOSITION:  PACU - hemodynamically stable

## 2012-09-16 NOTE — Consult Note (Signed)
Reason for Consult: Right hip and right wrist pain Referring Physician: Dr. Arnetha Courser [name] is an 72 y.o. male.  HPI: [name] is a 72 year old ambulatory patient who fell today without loss of consciousness in his home. He describes right wrist pain and right hip pain. He is unable to put weight on the right-hand side. He denies any other orthopedic complaints. He lives alone but his ex-wife's coming in stay with him within 3-4 days.  Past Medical History  Diagnosis Date  . Hx of colonic polyps   . Diverticulosis   . Hyperlipidemia   . AAA (abdominal aortic aneurysm) last ct 03-02-11    4.6cm  per note from dr fields w/ chart  . Substance abuse hx alcohol abuse  . Alcohol-induced chronic pancreatitis   . Hypertension stress test 1996- states wnl  . Peripheral vascular disease     occlusion left external iliac artery   . Gross hematuria     freq/ urge/ nocturia  . Arthritis     knees  . Peripheral arterial disease   . Transitional cell carcinoma     kidney, ureter, bladder stage 4    Past Surgical History  Procedure Laterality Date  . Knee surgery  1961    left  . Wrist fracture surgery  2010    orif left wrist  . Cataract extraction w/ intraocular lens  implant, bilateral  1998  . Hernia repair  2010    Left inguinal herniorraphy with mesh  . Cystoscopy w/ retrogrades  03/14/2011    Procedure: CYSTOSCOPY WITH RETROGRADE PYELOGRAM;  Surgeon: Valetta Fuller, MD;  Location: [facility];  Service: Urology;  Laterality: Bilateral;  . Cystoscopy with biopsy  03/14/2011    Procedure: CYSTOSCOPY WITH BIOPSY;  Surgeon: Valetta Fuller, MD;  Location: [facility];  Service: Urology;  Laterality: N/A;  c arm   . Cystoscopy w/ retrogrades  11/07/2011    Procedure: CYSTOSCOPY WITH RETROGRADE PYELOGRAM;  Surgeon: Valetta Fuller, MD;  Location: [facility];  Service: Urology;  Laterality: Bilateral;  CYSTOSCOPY, (B) RETROGRADE PYELOGRAM, RIGHT  FLEXIBLE URETEROSCOPY, RIGHT JJ STENT    . Cystoscopy w/ ureteral stent placement  11/07/2011    Procedure: CYSTOSCOPY WITH STENT REPLACEMENT;  Surgeon: Valetta Fuller, MD;  Location: [facility];  Service: Urology;  Laterality: Right;  . Cholecystectomy   July 1997- laparoscopic    complications of infection, w/ additional surg's 4 minor and 3 major  . Robot assited laparoscopic nephroureterectomy  04/16/2012    Procedure: ROBOT ASSITED LAPAROSCOPIC NEPHROURETERECTOMY;  Surgeon: Crecencio Mc, MD;  Location: [facility];  Service: Urology;  Laterality: Right;  . Lymphadenectomy  04/16/2012    Procedure: LYMPHADENECTOMY;  Surgeon: Crecencio Mc, MD;  Location: [facility];  Service: Urology;  Laterality: Right;  RETROPERITONEAL LYMPHADENECTOMY    Family History  Problem Relation Age of Onset  . Heart disease Mother 64  . Heart attack Father 62  . Heart disease Father   . Hypertension Brother     Social History:  reports that he quit smoking about 9 years ago. His smoking use included Cigarettes. He has a 100 pack-year smoking history. He has never used smokeless tobacco. He reports that he does not drink alcohol or use illicit drugs.  Allergies: No Known Allergies  Medications: I have reviewed the patient's current medications.  Results for orders placed during the hospital encounter of 09/16/12 (from the past 48 hour(s))  CBC  WITH DIFFERENTIAL     Status: Abnormal   Collection Time    09/16/12  5:15 AM      Result Value Range   WBC 12.2 (*) 4.0 - 10.5 K/Zachary   RBC 3.33 (*) 4.22 - 5.81 MIL/Zachary   Hemoglobin 9.8 (*) 13.0 - 17.0 g/dL   HCT 84.1 (*) 32.4 - 40.1 %   MCV 93.4  78.0 - 100.0 fL   MCH 29.4  26.0 - 34.0 pg   MCHC 31.5  30.0 - 36.0 g/dL   RDW 02.7  25.3 - 66.4 %   Platelets 289  150 - 400 K/Zachary   Neutrophils Relative % 90 (*) 43 - 77 %   Neutro Abs 10.9 (*) 1.7 - 7.7 K/Zachary   Lymphocytes Relative 5 (*) 12 - 46 %   Lymphs Abs 0.6 (*) 0.7 - 4.0 K/Zachary   Monocytes Relative 5  3 -  12 %   Monocytes Absolute 0.7  0.1 - 1.0 K/Zachary   Eosinophils Relative 0  0 - 5 %   Eosinophils Absolute 0.0  0.0 - 0.7 K/Zachary   Basophils Relative 0  0 - 1 %   Basophils Absolute 0.0  0.0 - 0.1 K/Zachary  COMPREHENSIVE METABOLIC PANEL     Status: Abnormal   Collection Time    09/16/12  5:15 AM      Result Value Range   Sodium 135  135 - 145 mEq/L   Potassium 4.9  3.5 - 5.1 mEq/L   Chloride 101  96 - 112 mEq/L   CO2 23  19 - 32 mEq/L   Glucose, Bld 121 (*) 70 - 99 mg/dL   BUN 22  6 - 23 mg/dL   Creatinine, Ser 4.03  0.50 - 1.35 mg/dL   Calcium 9.4  8.4 - 47.4 mg/dL   Total Protein 7.1  6.0 - 8.3 g/dL   Albumin 3.1 (*) 3.5 - 5.2 g/dL   AST 28  0 - 37 U/L   ALT 14  0 - 53 U/L   Alkaline Phosphatase 49  39 - 117 U/L   Total Bilirubin 0.2 (*) 0.3 - 1.2 mg/dL   GFR calc non Af Amer 56 (*) >90 mL/min   GFR calc Af Amer 65 (*) >90 mL/min   Comment:            The eGFR has been calculated     using the CKD EPI equation.     This calculation has not been     validated in all clinical     situations.     eGFR's persistently     <90 mL/min signify     possible Chronic Kidney Disease.  LIPASE, BLOOD     Status: None   Collection Time    09/16/12  5:15 AM      Result Value Range   Lipase 20  11 - 59 U/L    Dg Wrist Complete Right  09/16/2012   *RADIOLOGY REPORT*  Clinical Data: Fall.  Right wrist pain.  Limited range of motion.  RIGHT WRIST - COMPLETE 3+ VIEW  Comparison: None.  Findings: Nondisplaced fracture of the midbody of the scaphoid is best observed on the dedicated scaphoid view.  Old fracture or unfused secondary ossification center of the ulnar styloid noted.  Slight irregularity of the radial styloid is probably incidental and less likely to represent radial styloid fracture.  Linear lucency projecting posteriorly over the proximal carpal row may be related to the scaphoid  fracture or conceivably a small triquetral fracture.  IMPRESSION:  1.  Nondisplaced acute fracture of the  midbody of the scaphoid. 2.  Questionable posterior triquetral fracture. 3.  Slightly irregular radial styloid is probably intact. 4.  Old fracture or unfused secondary ossification center of the ulnar styloid.   Original Report Authenticated By: Gaylyn Rong, M.D.   Dg Hip Complete Right  09/16/2012   *RADIOLOGY REPORT*  Clinical Data: Fall.  Right hip pain.  RIGHT HIP - COMPLETE 2+ VIEW  Comparison: 07/13/2012  Findings: Asymmetric degenerative arthropathy of the right hip noted with loss of articular space and asymmetric right femoral head spurring, similar to that shown on 07/13/2012.  The degree of spurring at the junction of the femoral head and neck reduces sensitivity for nondisplaced fracture.  No definite fracture observed.  IMPRESSION:  1.  Asymmetric osteoarthritis of the right hip as shown on prior exam from March.  No definite fracture, but the degree of spurring along the junction of the femoral head and neck might reduce sensitivity for subtle fractures.  If the patient is unable to bear weight, MRI or CT would likely be warranted.   Original Report Authenticated By: Gaylyn Rong, M.D.   Ct Head Wo Contrast  09/16/2012   *RADIOLOGY REPORT*  Clinical Data: Fall  CT HEAD WITHOUT CONTRAST  Technique:  Contiguous axial images were obtained from the base of the skull through the vertex without contrast.  Comparison: None.  Findings: Mild chronic ischemic changes in the periventricular white matter.  Mild global atrophy.  No mass effect, midline shift, or acute intracranial hemorrhage.  Mastoid air cells are clear. Minimal mucosal thickening in the posterior maxillary sinuses. Minimal fluid in the left sphenoid sinus.  IMPRESSION: Mild inflammatory changes in the paranasal sinuses.  Otherwise, no acute intracranial pathology.   Original Report Authenticated By: Jolaine Click, M.D.   Ct Hip Right Wo Contrast  09/16/2012   *RADIOLOGY REPORT*  Clinical Data: Fall.  Right hip pain.  CT OF THE  RIGHT HIP WITHOUT CONTRAST  Technique:  Multidetector CT imaging was performed according to the standard protocol. Multiplanar CT image reconstructions were also generated.  Comparison: 09/16/2012  Findings: CT reveals cortical discontinuity compatible with fracture across the right femoral neck, primarily subcapital. Degenerative spurring of the right femoral head is also present with loss of articular space in the right hip joint. Degenerative subcortical cyst formation noted in the acetabular rim.  Chronic presacral edema as shown on prior CT scan.  IMPRESSION:  1.  Acute subcapital right femoral neck fracture.  Underlying moderate to prominent degenerative right hip arthropathy.   Original Report Authenticated By: Gaylyn Rong, M.D.   Dg Chest Port 1 View  09/16/2012   *RADIOLOGY REPORT*  Clinical Data: Low oxygen saturation  PORTABLE CHEST - 1 VIEW  Comparison: Chest radiograph 11/29/2011 and chest CT 07/13/2012  Findings: Stable mild cardiomegaly.  Tortuous thoracic aorta contour is stable.  Hilar contours within normal limits.  Faint small patchy opacity in the right upper lung field.  Minimal atelectasis or scarring in the lingula.  No visible pleural effusion or pneumothorax.  IMPRESSION:  1.  Faint, small opacity in the right upper lung field.  This could reflect a small area of airspace disease or atelectasis.  If there is clinical concern for pneumonia, short-term follow-up PA and lateral chest radiograph may be useful. 2.  Lingular atelectasis or scarring. 3.  Cardiomegaly without evidence of failure.   Original Report Authenticated By: Britta Mccreedy,  M.D.    Review of Systems  Constitutional: Negative.   HENT: Negative.   Eyes: Negative.   Respiratory: Negative.   Cardiovascular: Negative.   Gastrointestinal: Positive for abdominal pain.  Genitourinary: Negative.   Musculoskeletal: Positive for joint pain.  Skin: Negative.   Neurological: Negative.   Endo/Heme/Allergies: Negative.    Psychiatric/Behavioral: Negative.    Blood pressure 161/56, pulse 84, temperature 98.8 F (37.1 C), temperature source Oral, resp. rate 15, SpO2 95.00%. Physical Exam  Constitutional: He appears well-developed.  HENT:  Head: Normocephalic.  Eyes: Pupils are equal, round, and reactive to light.  Neck: Normal range of motion.  Cardiovascular: Normal rate.   Respiratory: Effort normal.  GI: Soft.  Neurological: He is alert.  Skin: Skin is warm.   patient has right wrist swelling intact EPL FPL interosseous function the right wrist is splinted elbow shoulder range of motion of the right-hand side is intact also has groin pain with internal extra rotation of the right leg. He does have right knee effusion but he states this is chronic issue his extensor mechanism is intact on the side left lower [name] he knee hip and ankle have no pain with range of motion pedal pulses palpable he has intact dorsiflexion plantarflexion strength in the foot.  Assessment/Plan: Impression is right hip nondisplaced subcapital femoral neck fracture and nondisplaced scaphoid waist fracture. Plan at her case dictation of the right hip. In regards to the right wrist and this has a pretty high likelihood of healing both of operative and nonoperative treatment. Operative treatment potentially allow him quicker return with range of motion however does expose him to additional operative risk. Nonoperative treatment will allow for healing as well and I do think he can be accommodated with a rolling walker. Fixation could be performed within for 6 weeks no evidence of healing has occurred. Plan this time is for operative fixation of the hip with cast immobilization of the right wrist with later fixation if the fracture doesn't heal patient understands risk and benefits of surgery all questions answered  Kaheem Halleck SCOTT 09/16/2012, 9:36 AM

## 2012-09-16 NOTE — ED Notes (Signed)
[id number]<AV> Expected date:<BR> Expected time:<BR> Means of arrival:<BR> Comments:<BR> EMS, 72 yo M, Fall

## 2012-09-16 NOTE — Anesthesia Postprocedure Evaluation (Signed)
Anesthesia Post Note  Patient: [name]  Procedure(s) Performed: Procedure(s) (LRB): CANNULATED HIP PINNING (Right)  Anesthesia type: General  Patient location: PACU  Post pain: Pain level controlled  Post assessment: Post-op Vital signs reviewed  Last Vitals:  Filed Vitals:   09/16/12 1600  BP:   Pulse:   Temp:   Resp: 20    Post vital signs: Reviewed  Level of consciousness: sedated  Complications: No apparent anesthesia complications

## 2012-09-16 NOTE — H&P (Signed)
[facility] History and Physical  [name]:[id number] DOB: [birth year] DOA: 09/16/2012  Referring physician: Franky Macho PCP: [name], MD  Specialists: Dr August Saucer.  Chief Complaint: fall this am.  HPI: [name] is a 72 y.o. 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 On arrival he was found to have right hip fracture and right wrist fracture. Dr August Saucer from orthopedics was consulted and he is taken in to OR shortly. He reports severe pain int he wrist and his lower extremity. His lab work revealed mild renal insufficiency, leukocytosis and anemia. He is being admitted to medical service with ortho consulting and assisting [place] for surgery. He does not report any abd pain at this time. His oxygen sats are running in low 90's and he was put on oxygen and his sats have improved. His CXR revealed faint opacity in the right upper field representing pneumonia.   Review of Systems: The patient denies anorexia, fever, weight loss,, vision loss, decreased hearing, hoarseness, chest pain, syncope, dyspnea on exertion, peripheral edema, balance deficits, hemoptysis, abdominal pain, melena, hematochezia, severe indigestion/heartburn, hematuria, incontinence, genital sores, muscle weakness, suspicious skin lesions, transient blindness,  depression, unusual weight change, abnormal bleeding,  angioedema, and breast masses.    Past Medical History  Diagnosis Date  . Hx of colonic polyps   . Diverticulosis   . Hyperlipidemia   . AAA (abdominal aortic aneurysm) last ct 03-02-11    4.6cm  per note from dr fields w/ chart  . Substance abuse hx alcohol abuse  . Alcohol-induced chronic pancreatitis   . Hypertension stress test 1996- states wnl  . Peripheral vascular disease     occlusion left external iliac artery   . Gross hematuria     freq/ urge/ nocturia  .  Arthritis     knees  . Peripheral arterial disease   . Transitional cell carcinoma     kidney, ureter, bladder stage 4   Past Surgical History  Procedure Laterality Date  . Knee surgery  1961    left  . Wrist fracture surgery  2010    orif left wrist  . Cataract extraction w/ intraocular lens  implant, bilateral  1998  . Hernia repair  2010    Left inguinal herniorraphy with mesh  . Cystoscopy w/ retrogrades  03/14/2011    Procedure: CYSTOSCOPY WITH RETROGRADE PYELOGRAM;  Surgeon: Valetta Fuller, MD;  Location: [facility];  Service: Urology;  Laterality: Bilateral;  . Cystoscopy with biopsy  03/14/2011    Procedure: CYSTOSCOPY WITH BIOPSY;  Surgeon: Valetta Fuller, MD;  Location: [facility];  Service: Urology;  Laterality: N/A;  c arm   . Cystoscopy w/ retrogrades  11/07/2011    Procedure: CYSTOSCOPY WITH RETROGRADE PYELOGRAM;  Surgeon: Valetta Fuller, MD;  Location[facility];  Service: Urology;  Laterality: Bilateral;  CYSTOSCOPY, (B) RETROGRADE PYELOGRAM, RIGHT FLEXIBLE URETEROSCOPY, RIGHT JJ STENT    . Cystoscopy w/ ureteral stent placement  11/07/2011    Procedure: CYSTOSCOPY WITH STENT REPLACEMENT;  Surgeon: Valetta Fuller, MD;  Location: [facility];  Service: Urology;  Laterality: Right;  . Cholecystectomy   July 1997- laparoscopic    complications of infection, w/ additional surg's 4 minor and 3 major  . Robot assited laparoscopic nephroureterectomy  04/16/2012    Procedure: ROBOT ASSITED LAPAROSCOPIC NEPHROURETERECTOMY;  Surgeon: Council Mechanic  Laverle Patter, MD;  Location: [facility];  Service: Urology;  Laterality: Right;  . Lymphadenectomy  04/16/2012    Procedure: LYMPHADENECTOMY;  Surgeon: Crecencio Mc, MD;  Location: [facility];  Service: Urology;  Laterality: Right;  RETROPERITONEAL LYMPHADENECTOMY   Social History:  reports that he quit smoking about 9 years ago. His smoking use included Cigarettes. He has a 100 pack-year smoking  history. He has never used smokeless tobacco. He reports that he does not drink alcohol or use illicit drugs.  where does patient live--home,No Known Allergies  Family History  Problem Relation Age of Onset  . Heart disease Mother 68  . Heart attack Father 72  . Heart disease Father   . Hypertension Brother     Prior to Admission medications   Medication Sig Start Date End Date Taking? Authorizing Provider  amLODipine (NORVASC) 5 MG tablet Take 5 mg by mouth every morning.   Yes Historical Provider, MD  aspirin 81 MG tablet Take 81 mg by mouth every morning.    Yes Historical Provider, MD  docusate sodium (COLACE) 100 MG capsule Take 100 mg by mouth every evening.   Yes Historical Provider, MD  HYDROcodone-acetaminophen (NORCO) 7.5-325 MG per tablet Take 1.5 tablets by mouth every 6 (six) hours as needed for pain. 09/05/12  Yes Lindley Magnus, MD  lipase/protease/amylase (CREON) 12000 UNITS CPEP Take 2 capsules by mouth 3 (three) times daily before meals. 06/20/12  Yes Bruce Romilda Garret, MD  lisinopril (PRINIVIL,ZESTRIL) 20 MG tablet Take 20 mg by mouth every evening.    Yes Historical Provider, MD  LORazepam (ATIVAN) 1 MG tablet Take 1.5 mg by mouth every 8 (eight) hours as needed for anxiety.    Yes Historical Provider, MD  oxyCODONE-acetaminophen (PERCOCET) 10-325 MG per tablet Take 1.5 tablets by mouth every 6 (six) hours as needed for pain. Patient takes at night 09/05/12  Yes Bruce Romilda Garret, MD  prochlorperazine (COMPAZINE) 10 MG tablet Take 10 mg by mouth every 6 (six) hours as needed (for nausea).  07/11/12  Yes Historical Provider, MD  rosuvastatin (CRESTOR) 10 MG tablet Take 10 mg by mouth every evening.   Yes Historical Provider, MD  senna (SENOKOT) 8.6 MG TABS Take 1 tablet by mouth every other day.   Yes Historical Provider, MD   Physical Exam: Filed Vitals:   09/16/12 0357 09/16/12 0613 09/16/12 0731 09/16/12 0835  BP: 119/62 134/69 145/64 161/56  Pulse: 62 65 76 84  Temp: 98.1 F  (36.7 C) 98.3 F (36.8 C) 98.8 F (37.1 C)   TempSrc: Oral Oral Oral   Resp: 16   15  SpO2: 93% 92% 92% 95%    Constitutional: Vital signs reviewed.  Patient is a well-developed and well-nourished  in mild acute distress and cooperative with exam. Alert and oriented x3.  Head: Normocephalic and atraumatic Mouth: no erythema or exudates, MMM Eyes: PERRL, EOMI, conjunctivae normal, No scleral icterus.  Neck: Supple, Trachea midline normal ROM, No JVD, mass, thyromegaly, or carotid bruit present.  Cardiovascular: RRR, S1 normal, S2 normal, no MRG, pulses symmetric and intact bilaterally Pulmonary/Chest: CTAB, no wheezes, rales, or rhonchi Abdominal: Soft. Non-tender, non-distended, bowel sounds are normal, no masses, organomegaly, or guarding present.  Musculoskeletal: painful ROM in right wrist and right RLE.  Neurological: A&O x3, , no focal motor deficit, sensory intact to light touch bilaterally.  Skin: Warm, dry and intact. No rash, cyanosis, or clubbing.  Psychiatric: Normal mood and affect. speech and behavior is normal.  Labs  on Admission:  Basic Metabolic Panel:  Recent Labs Lab 09/16/12 0515  NA 135  K 4.9  CL 101  CO2 23  GLUCOSE 121*  BUN 22  CREATININE 1.23  CALCIUM 9.4   Liver Function Tests:  Recent Labs Lab 09/16/12 0515  AST 28  ALT 14  ALKPHOS 49  BILITOT 0.2*  PROT 7.1  ALBUMIN 3.1*    Recent Labs Lab 09/16/12 0515  LIPASE 20   No results found for this basename: AMMONIA,  in the last 168 hours CBC:  Recent Labs Lab 09/16/12 0515  WBC 12.2*  NEUTROABS 10.9*  HGB 9.8*  HCT 31.1*  MCV 93.4  PLT 289   Cardiac Enzymes: No results found for this basename: CKTOTAL, CKMB, CKMBINDEX, TROPONINI,  in the last 168 hours  BNP (last 3 results) No results found for this basename: PROBNP,  in the last 8760 hours CBG: No results found for this basename: GLUCAP,  in the last 168 hours  Radiological Exams on Admission: Dg Wrist Complete  Right  09/16/2012   *RADIOLOGY REPORT*  Clinical Data: Fall.  Right wrist pain.  Limited range of motion.  RIGHT WRIST - COMPLETE 3+ VIEW  Comparison: None.  Findings: Nondisplaced fracture of the midbody of the scaphoid is best observed on the dedicated scaphoid view.  Old fracture or unfused secondary ossification center of the ulnar styloid noted.  Slight irregularity of the radial styloid is probably incidental and less likely to represent radial styloid fracture.  Linear lucency projecting posteriorly over the proximal carpal row may be related to the scaphoid fracture or conceivably a small triquetral fracture.  IMPRESSION:  1.  Nondisplaced acute fracture of the midbody of the scaphoid. 2.  Questionable posterior triquetral fracture. 3.  Slightly irregular radial styloid is probably intact. 4.  Old fracture or unfused secondary ossification center of the ulnar styloid.   Original Report Authenticated By: Gaylyn Rong, M.D.   Dg Hip Complete Right  09/16/2012   *RADIOLOGY REPORT*  Clinical Data: Fall.  Right hip pain.  RIGHT HIP - COMPLETE 2+ VIEW  Comparison: 07/13/2012  Findings: Asymmetric degenerative arthropathy of the right hip noted with loss of articular space and asymmetric right femoral head spurring, similar to that shown on 07/13/2012.  The degree of spurring at the junction of the femoral head and neck reduces sensitivity for nondisplaced fracture.  No definite fracture observed.  IMPRESSION:  1.  Asymmetric osteoarthritis of the right hip as shown on prior exam from March.  No definite fracture, but the degree of spurring along the junction of the femoral head and neck might reduce sensitivity for subtle fractures.  If the patient is unable to bear weight, MRI or CT would likely be warranted.   Original Report Authenticated By: Gaylyn Rong, M.D.   Ct Head Wo Contrast  09/16/2012   *RADIOLOGY REPORT*  Clinical Data: Fall  CT HEAD WITHOUT CONTRAST  Technique:  Contiguous axial  images were obtained from the base of the skull through the vertex without contrast.  Comparison: None.  Findings: Mild chronic ischemic changes in the periventricular white matter.  Mild global atrophy.  No mass effect, midline shift, or acute intracranial hemorrhage.  Mastoid air cells are clear. Minimal mucosal thickening in the posterior maxillary sinuses. Minimal fluid in the left sphenoid sinus.  IMPRESSION: Mild inflammatory changes in the paranasal sinuses.  Otherwise, no acute intracranial pathology.   Original Report Authenticated By: Jolaine Click, M.D.   Ct Hip Right Wo Contrast  09/16/2012   *RADIOLOGY REPORT*  Clinical Data: Fall.  Right hip pain.  CT OF THE RIGHT HIP WITHOUT CONTRAST  Technique:  Multidetector CT imaging was performed according to the standard protocol. Multiplanar CT image reconstructions were also generated.  Comparison: 09/16/2012  Findings: CT reveals cortical discontinuity compatible with fracture across the right femoral neck, primarily subcapital. Degenerative spurring of the right femoral head is also present with loss of articular space in the right hip joint. Degenerative subcortical cyst formation noted in the acetabular rim.  Chronic presacral edema as shown on prior CT scan.  IMPRESSION:  1.  Acute subcapital right femoral neck fracture.  Underlying moderate to prominent degenerative right hip arthropathy.   Original Report Authenticated By: Gaylyn Rong, M.D.   Dg Chest Port 1 View  09/16/2012   *RADIOLOGY REPORT*  Clinical Data: Low oxygen saturation  PORTABLE CHEST - 1 VIEW  Comparison: Chest radiograph 11/29/2011 and chest CT 07/13/2012  Findings: Stable mild cardiomegaly.  Tortuous thoracic aorta contour is stable.  Hilar contours within normal limits.  Faint small patchy opacity in the right upper lung field.  Minimal atelectasis or scarring in the lingula.  No visible pleural effusion or pneumothorax.  IMPRESSION:  1.  Faint, small opacity in the right  upper lung field.  This could reflect a small area of airspace disease or atelectasis.  If there is clinical concern for pneumonia, short-term follow-up PA and lateral chest radiograph may be useful. 2.  Lingular atelectasis or scarring. 3.  Cardiomegaly without evidence of failure.   Original Report Authenticated By: Britta Mccreedy, M.D.    EKG: sinus rhythm with old RBBB  Assessment/Plan Active Problems: 1. Right  HIP FRACTURE:  - admit to telemetry.  - orthopedics on board for surgical repair of the right hip.,  - adequate pain control and fluids.   2. Right wrist fracture:  -  On sling - non surgical management - PT eval.   3. Chronic pancreatitis: resume home medications.  - his lipase level within normal limits.  -  4. Early pneumonia:  - pneumonia work up ordered.  - blood cultures - urine streptococcal antigen and legionella antigen ordered and pending.  - on IV levaquin.  - recommend repeat 2 view CXR in 48 hrs to evaluate esolution of pneumonia.   5. Transitional cell carcinoma: scheduled for IMAGING in the second week of June under Dr Clelia Croft. Outpatient follow up.   6. Hypertension: controlled. Resume home medications.   7. AAA: recommend outpatient follow up with CT abd and pelvis.   8. Leukocytosis: probably from the pneumonia.   9. Anemia: normocytic. Baseline H&H is around 11. Continue to monitor.   10. DVT prophylaxis. Lovenox.   Code Status: full code Family Communication: none at bedside.  Disposition Plan: pending PT EVAL AFTER SURGERY.  Time spent: 70 minutes.   [facility] Pager [phone]  If 7PM-7AM, please contact night-coverage [email or web address] Password [facility] 09/16/2012, 9:47 AM

## 2012-09-16 NOTE — Progress Notes (Signed)
Orthopedic Tech Progress Note Patient Details:  [name] [birth year] [id number] OHF applied to bed Patient ID: [name], male   DOB: 05/06/1940, 72 y.o.   MRN: [id number]   Orie Rout 09/16/2012, 2:14 PM

## 2012-09-16 NOTE — Transfer of Care (Signed)
Immediate Anesthesia Transfer of Care Note  Patient: [name]  Procedure(s) Performed: Procedure(s): CANNULATED HIP PINNING (Right)  Patient Location: PACU  Anesthesia Type:General  Level of Consciousness: awake, alert , oriented and patient cooperative  Airway & Oxygen Therapy: Patient Spontanous Breathing and Patient connected to face mask oxygen  Post-op Assessment: Report given to PACU RN and Post -op Vital signs reviewed and stable  Post vital signs: Reviewed and stable  Complications: No apparent anesthesia complications

## 2012-09-17 ENCOUNTER — Inpatient Hospital Stay (HOSPITAL_COMMUNITY): Payer: Medicare Other

## 2012-09-17 LAB — EXPECTORATED SPUTUM ASSESSMENT W GRAM STAIN, RFLX TO RESP C

## 2012-09-17 LAB — CBC
HCT: 29.7 % — ABNORMAL LOW (ref 39.0–52.0)
Hemoglobin: 9.2 g/dL — ABNORMAL LOW (ref 13.0–17.0)
MCH: 29.2 pg (ref 26.0–34.0)
MCV: 94.3 fL (ref 78.0–100.0)
Platelets: 249 10*3/uL (ref 150–400)
RBC: 3.15 MIL/uL — ABNORMAL LOW (ref 4.22–5.81)

## 2012-09-17 LAB — BASIC METABOLIC PANEL
BUN: 19 mg/dL (ref 6–23)
CO2: 23 mEq/L (ref 19–32)
Calcium: 9 mg/dL (ref 8.4–10.5)
Creatinine, Ser: 1.17 mg/dL (ref 0.50–1.35)
Glucose, Bld: 101 mg/dL — ABNORMAL HIGH (ref 70–99)

## 2012-09-17 LAB — LEGIONELLA ANTIGEN, URINE: Legionella Antigen, Urine: NEGATIVE

## 2012-09-17 MED ORDER — LEVOFLOXACIN 750 MG PO TABS
750.0000 mg | ORAL_TABLET | Freq: Every day | ORAL | Status: AC
  Administered 2012-09-17 – 2012-09-20 (×4): 750 mg via ORAL
  Filled 2012-09-17 (×4): qty 1

## 2012-09-17 MED ORDER — HYDROMORPHONE HCL PF 2 MG/ML IJ SOLN: 2.0000 mg | INTRAMUSCULAR | Status: DC

## 2012-09-17 MED ORDER — HYDROMORPHONE HCL PF 2 MG/ML IJ SOLN: 2.0000 mg | INTRAMUSCULAR | Status: DC | PRN

## 2012-09-17 MED ORDER — ASPIRIN EC 81 MG PO TBEC
81.0000 mg | DELAYED_RELEASE_TABLET | Freq: Every day | ORAL | Status: DC
  Administered 2012-09-17 – 2012-09-20 (×4): 81 mg via ORAL
  Filled 2012-09-17 (×6): qty 1

## 2012-09-17 MED ORDER — HYDROMORPHONE HCL PF 2 MG/ML IJ SOLN
2.0000 mg | INTRAMUSCULAR | Status: DC
  Administered 2012-09-17 – 2012-09-18 (×9): 2 mg via INTRAVENOUS
  Filled 2012-09-17 (×9): qty 1

## 2012-09-17 NOTE — Progress Notes (Addendum)
Clinical Social Work Department CLINICAL SOCIAL WORK PLACEMENT NOTE 09/17/2012  Patient:  LEVANTE, SIMONES  Account Number:  [id number] Admit date:  09/16/2012  Clinical Social Worker:  Unk Lightning, LCSW  Date/time:  09/17/2012 02:00 PM  Clinical Social Work is seeking post-discharge placement for this patient at the following level of care:   SKILLED NURSING   (*CSW will update this form in Epic as items are completed)   09/17/2012  Patient/family provided with [facility] Department of Clinical Social Work's list of facilities offering this level of care within the geographic area requested by the patient (or if unable, by the patient's family).  09/17/2012  Patient/family informed of their freedom to choose among providers that offer the needed level of care, that participate in Medicare, Medicaid or managed care program needed by the patient, have an available bed and are willing to accept the patient.  09/17/2012  Patient/family informed of [facility]' ownership interest in [facility], as well as of the fact that they are under no obligation to receive care at this facility.  PASARR submitted to EDS on 09/17/2012 PASARR number received from [employer] on 09/17/2012  FL2 transmitted to all facilities in geographic area requested by pt/family on  09/17/2012 FL2 transmitted to all facilities within larger geographic area on   Patient informed that his/her managed care company has contracts with or will negotiate with  certain facilities, including the following:     Patient/family informed of bed offers received:  09/18/12 Patient chooses bed at [facility] Physician recommends and patient chooses bed at    Patient to be transferred to [facility] on  09/20/12 Patient to be transferred to facility by [facility]  The following physician request were entered in Epic:   Additional Comments:

## 2012-09-17 NOTE — Progress Notes (Signed)
Clinical Social Work Department BRIEF PSYCHOSOCIAL ASSESSMENT 09/17/2012  Patient:  ORESTE, MAJEED     Account Number:  [id number]     Admit date:  09/16/2012  Clinical Social Worker:  Dennison Bulla  Date/Time:  09/17/2012 02:00 PM  Referred by:  Physician  Date Referred:  09/17/2012 Referred for  SNF Placement   Other Referral:   Interview type:  Patient Other interview type:    PSYCHOSOCIAL DATA Living Status:  ALONE Admitted from facility:   Level of care:   Primary support name:  Alona Bene Primary support relationship to patient:  SPOUSE Degree of support available:   Adequate-Patient reports that he and wife have been married and divorced three times but that she is supportive and will assist as needed.    CURRENT CONCERNS Current Concerns  Post-Acute Placement   Other Concerns:    SOCIAL WORK ASSESSMENT / PLAN CSW received referral to assist with DC planning. CSW reviewed chart and met with patient at bedside. No visitors present during assessment. CSW introduced myself and explained role.    Patient reports he fell at home but was able to call 911. Patient lives alone but ex-wife is supportive and plans to assist at DC. CSW spoke with patient regarding PT/OT recommendations for SNF placement at DC. Patient reports he was informed that SNF would be needed and wanted a SNF close to home. CSW provided SNF list and encouraged patient to review list to determine where he would be willing to attend. CSW explained Medicare coverage for SNF placement. CSW received referral to complete [facility] search.    CSW completed FL2 and faxed out. CSW will follow up with bed offers.   Assessment/plan status:  Psychosocial Support/Ongoing Assessment of Needs Other assessment/ plan:   Information/referral to community resources:   SNF information    PATIENT'S/FAMILY'S RESPONSE TO PLAN OF CARE: Patient alert and oriented and agreeable to SNF search. Patient thanked CSW for time  and agreeable to follow up.       Coverage for Unice Bailey

## 2012-09-17 NOTE — Progress Notes (Signed)
Patient had a short run of V-Tach (18beats). RN checked the patient and the patient was waking from his sleep. Vitals: 98.72F,66,16,139/85 96% 3 L [place]. Patient was asymptomatic. PCP on call was notified. Awaiting  any new orders.

## 2012-09-17 NOTE — Evaluation (Signed)
Occupational Therapy Evaluation Patient Details Name: [name] MRN: [id number] DOB: 1940/11/05 Today's Date: 09/17/2012 Time: 2130-8657 OT Time Calculation (min): 61 min  OT Assessment / Plan / Recommendation Clinical Impression  72 yo male s/p R hip pinning. Sustained R femoral fx and R scaphoid/wrist fx. On eval, pt required +2 assist for sit to stand and ADL. Able to stand with PFRW but unable to pivot at time of eval. Recommend SNF for continued rehab.     OT Assessment  Patient needs continued OT Services    Follow Up Recommendations  SNF;Supervision/Assistance - 24 hour    Barriers to Discharge      Equipment Recommendations  None recommended by OT    Recommendations for Other Services    Frequency  Min 2X/week    Precautions / Restrictions Precautions Precautions: Fall Restrictions Weight Bearing Restrictions: Yes RUE Weight Bearing: Non weight bearing RLE Weight Bearing: Touchdown weight bearing Other Position/Activity Restrictions: PFRW R UE        ADL  Eating/Feeding: Simulated;Set up Where Assessed - Eating/Feeding: Bed level Grooming: Simulated;Set up;Wash/dry face Where Assessed - Grooming: Supine, head of bed up Upper Body Bathing: Simulated;Chest;Right arm;Left arm;Abdomen;Moderate assistance Where Assessed - Upper Body Bathing: Unsupported sitting Lower Body Bathing: Simulated;+2 Total assistance Lower Body Bathing: Patient Percentage: 20% Where Assessed - Lower Body Bathing: Supported sit to stand Upper Body Dressing: Simulated;Maximal assistance Where Assessed - Upper Body Dressing: Unsupported sitting Lower Body Dressing: Simulated;+2 Total assistance Lower Body Dressing: Patient Percentage: 0% Where Assessed - Lower Body Dressing: Supported sit to stand Toilet Transfer: Simulated;+2 Total assistance Toilet Transfer: Patient Percentage: 40% Toilet Transfer Method: Sit to stand (stood only. with PFRW) Toileting - Clothing Manipulation and  Hygiene: Simulated;+2 Total assistance Toileting - Clothing Manipulation and Hygiene: Patient Percentage: 0% Where Assessed - Toileting Clothing Manipulation and Hygiene: Sit to stand from 3-in-1 or toilet Equipment Used: Other (comment) (PFRW) ADL Comments: Educated pt on positioning of R UE on pillows and digit ROM R hand. Pt with edema in R digits and painful to move. Pt states more pain in R wrist than hip. He is motivated but limited by pain and ability to maintain TDWB on R LE to pivot.    OT Diagnosis: Generalized weakness;Acute pain  OT Problem List: Decreased strength;Decreased range of motion;Pain;Decreased knowledge of use of DME or AE;Decreased knowledge of precautions OT Treatment Interventions: Self-care/ADL training;Therapeutic activities;DME and/or AE instruction;Patient/family education   OT Goals Acute Rehab OT Goals OT Goal Formulation: With patient Time For Goal Achievement: 09/24/12 Potential to Achieve Goals: Good ADL Goals Pt Will Perform Grooming: with min assist;Unsupported;Sitting, edge of bed;Sitting, chair ADL Goal: Grooming - Progress: Goal set today Pt Will Perform Upper Body Bathing: with min assist;Sitting, edge of bed;Sitting, chair;Unsupported ADL Goal: Upper Body Bathing - Progress: Goal set today Pt Will Perform Lower Body Bathing: with adaptive equipment;Sitting, edge of bed;Sitting, chair;with mod assist (lean side to side) ADL Goal: Lower Body Bathing - Progress: Goal set today Pt Will Transfer to Toilet: with 2+ total assist;Stand pivot transfer;Other (comment) (pt 50%) ADL Goal: Toilet Transfer - Progress: Goal set today Pt Will Perform Toileting - Clothing Manipulation: with mod assist;Other (comment) (sit and lean) ADL Goal: Toileting - Clothing Manipulation - Progress: Goal set today Additional ADL Goal #1: Pt will be independent with R UE positioning/verbalize need for elevation and demonstrate finger ROM exercises ADL Goal: Additional Goal #1 -  Progress: Goal set today  Visit Information  Last OT Received On:  09/17/12 Assistance Needed: +2 PT/OT Co-Evaluation/Treatment: Yes    Subjective Data  Subjective: I know its going to hurt Patient Stated Goal: wants to be able to move more   Prior Functioning     Home Living Lives With: Alone Available Help at Discharge: Friend(s) Type of Home: Other (Comment) (Townhouse) Home Access: Stairs to enter [employer] of Steps: 1 Home Layout: Able to live on main level with bedroom/bathroom;Two level Bathroom Shower/Tub: Health visitor: Standard Home Adaptive Equipment: Walker - rolling;Bedside commode/3-in-1;Reacher;Shower chair with back Prior Function Level of Independence: Independent with assistive device(s) Able to Take Stairs?: Yes (its difficult) Driving: Yes Communication Communication: No difficulties Dominant Hand: Right         Vision/Perception     Cognition  Cognition Arousal/Alertness: Awake/alert Behavior During Therapy: WFL for tasks assessed/performed Overall Cognitive Status: Within Functional Limits for tasks assessed    Extremity/Trunk Assessment Right Upper Extremity Assessment RUE ROM/Strength/Tone: Deficits RUE ROM/Strength/Tone Deficits: R wrist in splint/wrap. Able to move at elbow and shoulder. Able to wiggle digits but note edema and unable to fully flex/extend. Educated on ROM exercises for R hand.  Left Upper Extremity Assessment LUE ROM/Strength/Tone: WFL for tasks assessed Right Lower Extremity Assessment RLE ROM/Strength/Tone: Unable to fully assess;Due to pain;Deficits RLE ROM/Strength/Tone Deficits: moves ankle well. hip abd 2/5, hip flex 2/5 Left Lower Extremity Assessment LLE ROM/Strength/Tone: Deficits LLE ROM/Strength/Tone Deficits: Decreased knee ROM-flex ~80-90 degrees sitting EOB. Strength 3+/5.      Mobility Bed Mobility Bed Mobility: Supine to Sit;Sit to Supine Supine to Sit: 1: +2 Total  assist;HOB elevated;With rails Supine to Sit: Patient Percentage: 70% Sit to Supine: 1: +2 Total assist;HOB flat Sit to Supine: Patient Percentage: 30% Details for Bed Mobility Assistance: Multimodal cues for safety, technique, hand placement. Assist for R LE off/onto bed/  Utilized bed pad for scooting, positioning. Increased time.  Transfers Sit to Stand: 1: +2 Total assist;From elevated surface Sit to Stand: Patient Percentage: 40% Stand to Sit: 1: +2 Total assist;To elevated surface Stand to Sit: Patient Percentage: 40% Details for Transfer Assistance: Multimodal cues for safety, technique, hand placement, WB precautions. Assist to rise, stabilize, control descent. Pt able to achieve full standing but unable to stand or squat pivot. Therapist monitored WBing on R LE by placing foot under pt's  foot.      Exercise     Balance     End of Session OT - End of Session Equipment Utilized During Treatment: Gait belt Activity Tolerance: Patient limited by pain Patient left: in bed;with call bell/phone within reach  GO     Lennox Laity [phone] 09/17/2012, 12:02 PM

## 2012-09-17 NOTE — Progress Notes (Signed)
PHARMACIST - PHYSICIAN COMMUNICATION [facility] CONCERNING: Antibiotic IV to Oral Route Change Policy  RECOMMENDATION: This patient is receiving Levaquin by the intravenous route.  Based on criteria approved by the Pharmacy and Therapeutics Committee, the antibiotic(s) is/are being converted to the equivalent oral dose form(s).   DESCRIPTION: These criteria include:  Patient being treated for a respiratory tract infection, urinary tract infection, or cellulitis  The patient is not neutropenic and does not exhibit a GI malabsorption state  The patient is eating (either orally or via tube) and/or has been taking other orally administered medications for a least 24 hours  The patient is improving clinically and has a Tmax < 100.5  If you have questions about this conversion, please contact the Pharmacy Department  []   [phone] )  [facility] []   [phone] )  [facility]  []   [phone] )  [facility] [x]   [phone])  [facility]    Hessie Knows, PharmD, [place] Pager [phone] 09/17/2012 9:32 AM

## 2012-09-17 NOTE — Progress Notes (Signed)
Subjective: Pt stable - reports pain level as high in wrist   Objective: Vital signs in last 24 hours: Temp:  [97.6 F (36.4 C)-98.7 F (37.1 C)] 98.3 F (36.8 C) (05/19 0549) Pulse Rate:  [63-84] 67 (05/19 0549) Resp:  [13-23] 16 (05/19 0549) BP: (125-163)/(56-85) 125/66 mmHg (05/19 0549) SpO2:  [93 %-100 %] 95 % (05/19 0549) Weight:  [90.719 kg (200 lb)] 90.719 kg (200 lb) (05/18 2017)  Intake/Output from previous day: 05/18 0701 - 05/19 0700 In: 2107.5 [P.O.:240; I.V.:1817.5; IV Piggyback:50] Out: 525 [Urine:500; Blood:25] Intake/Output this shift:    Exam:  Sensation intact distally Intact pulses distally Dorsiflexion/Plantar flexion intact  Labs:  Recent Labs  09/16/12 0515 09/17/12 0440  HGB 9.8* 9.2*    Recent Labs  09/16/12 0515 09/17/12 0440  WBC 12.2* 10.0  RBC 3.33* 3.15*  HCT 31.1* 29.7*  PLT 289 249    Recent Labs  09/16/12 0515 09/17/12 0440  NA 135 133*  K 4.9 4.4  CL 101 100  CO2 23 23  BUN 22 19  CREATININE 1.23 1.17  GLUCOSE 121* 101*  CALCIUM 9.4 9.0    Recent Labs  09/17/12 0440  INR 1.40    Assessment/Plan: Plan to mobilize with PT today - adjust pain meds - cast wrist Tuesday after swelling decreases - snf placement likely   DEAN,GREGORY SCOTT 09/17/2012, 7:47 AM

## 2012-09-17 NOTE — Op Note (Signed)
NAME:  TESLA, BOCHICCHIO NO.:  [id number]  MEDICAL RECORD NO.:  [id number]  LOCATION:  [facility]                         FACILITY:  [facility]  PHYSICIAN:  Burnard Bunting, M.D.    DATE OF BIRTH:  Jan 26, 1941  DATE OF PROCEDURE: DATE OF DISCHARGE:                              OPERATIVE REPORT   PREOPERATIVE DIAGNOSIS:  Right hip fracture.  POSTOPERATIVE DIAGNOSIS:  Right hip fracture.  PROCEDURE:  Right hip fracture percutaneous pinning.  SURGEON:  Burnard Bunting, M.D.  ASSISTANT:  None.  ANESTHESIA:  General endotracheal.  ESTIMATED BLOOD LOSS:  10 mL.  INDICATIONS:  Tesean Stump is a 72 year old patient who fell and injured his right hip and right wrist today in a fall and presents now for operative management after explanation of risks versus benefits.  PROCEDURE IN DETAIL:  The patient was brought to the operating room where general endotracheal anesthesia was induced.  Preop antibiotics were administered.  Time-out was called.  Right hip and leg was scrubbed with alcohol and Betadine, area was then prepped with DuraPrep solution and draped in a sterile manner.  The left leg was placed in lithotomy position with peroneal nerve well padded.  Under slight traction internal rotation excellent anatomic fracture reduction was achieved.  A 2-cm incision was then made in the lateral aspect of the thigh, 365 cannulated screws were placed in inverted V fashion for optimal biomechanical stability into the femoral head and neck.  Correct screw location was confirmed in the AP and lateral planes under fluoroscopy. At this point, thorough irrigation of the incision was performed which was then closed using 2-0 Vicryl and 3-0 nylon.  Mepilex dressing applied.  Patient also had his right knee examined fluoroscopically because of preoperative fusion and no fractures were evident.  The patient was then transferred to recovery in stable condition.     Burnard Bunting,  M.D.     GSD/MEDQ  D:  09/16/2012  T:  09/16/2012  Job:  [id number]

## 2012-09-17 NOTE — Progress Notes (Addendum)
PROGRESS NOTE  [name] WUJ:[id number] DOB: May 09, 1940 DOA: 09/16/2012 PCP: [name], MD  Brief narrative: 72 yr old Male with known stage IV transitional Ca Renal pelvis, H/o ? septic Gall bladder 1997, Alcoholic pancreatitis + chronic pain admitted 5/18 with fall resulting in R scaphoid fracture [ deemed non-operable by Ortho], R Hip Fracture s/p pinning R Hip 5/18.  Past medical history-As per Problem list Chart reviewed as below- Admission 12.16.13 for R lap Nephroureterectomy  Admission 11/15/05 c Chronic pancreatitis with abnormal pancreatic head Admission 11/12/05 with acute/chronic pancreatitis Admission 02/05/05 with pancreatitis Admission 08/11/04 c recurrent pancreatitis Admission 01/23/04 for : Iliac angio  Consultants:  Ortho Dr. August Saucer  Procedures:  R HIP percutaneous pinning 09/16/12  Antibiotics:  None currently   Subjective  Still severe Hand pain.  Not hungry Wants increase in pain meds    Objective    Interim History: nad  Telemetry: 18 bts Vtach, on moving around briefly today-did not recur  Objective: Filed Vitals:   09/17/12 0438 09/17/12 0549 09/17/12 0800 09/17/12 1121  BP: 139/85 125/66    Pulse: 66 67    Temp: 98.5 F (36.9 C) 98.3 F (36.8 C)    TempSrc: Oral Oral    Resp: 16 16 18 18   Height:      Weight:      SpO2: 96% 95% 95% 95%    Intake/Output Summary (Last 24 hours) at 09/17/12 1216 Last data filed at 09/17/12 0313  Gross per 24 hour  Intake 1307.5 ml  Output    900 ml  Net  407.5 ml    Exam:  General: alert pleasant Cardiovascular:  s1 s 2no m/r/g Respiratory: Clear, but coughs up sputum in room Abdomen: soft/Nt/ND Skin nad   Data Reviewed: Basic Metabolic Panel:  Recent Labs Lab 09/16/12 0515 09/17/12 0440  NA 135 133*  K 4.9 4.4  CL 101 100  CO2 23 23  GLUCOSE 121* 101*  BUN 22 19  CREATININE 1.23 1.17  CALCIUM 9.4 9.0   Liver Function Tests:  Recent Labs Lab 09/16/12 0515   AST 28  ALT 14  ALKPHOS 49  BILITOT 0.2*  PROT 7.1  ALBUMIN 3.1*    Recent Labs Lab 09/16/12 0515  LIPASE 20   No results found for this basename: AMMONIA,  in the last 168 hours CBC:  Recent Labs Lab 09/16/12 0515 09/17/12 0440  WBC 12.2* 10.0  NEUTROABS 10.9*  --   HGB 9.8* 9.2*  HCT 31.1* 29.7*  MCV 93.4 94.3  PLT 289 249   Cardiac Enzymes: No results found for this basename: CKTOTAL, CKMB, CKMBINDEX, TROPONINI,  in the last 168 hours BNP: No components found with this basename: POCBNP,  CBG: No results found for this basename: GLUCAP,  in the last 168 hours  Recent Results (from the past 240 hour(s))  CULTURE, BLOOD (ROUTINE X 2)     Status: None   Collection Time    09/16/12  2:52 PM      Result Value Range Status   Specimen Description BLOOD RIGHT ARM   Final   Special Requests BOTTLES DRAWN AEROBIC AND ANAEROBIC 1CC   Final   Culture  Setup Time 09/16/2012 17:32   Final   Culture     Final   Value:        BLOOD CULTURE RECEIVED NO GROWTH TO DATE CULTURE WILL BE HELD FOR 5 DAYS BEFORE ISSUING A FINAL NEGATIVE REPORT   Report Status PENDING   Incomplete  CULTURE, BLOOD (ROUTINE X 2)     Status: None   Collection Time    09/16/12  3:00 PM      Result Value Range Status   Specimen Description BLOOD RIGHT ARM   Final   Special Requests BOTTLES DRAWN AEROBIC AND ANAEROBIC 4CC   Final   Culture  Setup Time 09/16/2012 17:32   Final   Culture     Final   Value:        BLOOD CULTURE RECEIVED NO GROWTH TO DATE CULTURE WILL BE HELD FOR 5 DAYS BEFORE ISSUING A FINAL NEGATIVE REPORT   Report Status PENDING   Incomplete     Studies:              All Imaging reviewed and is as per above notation   Scheduled Meds: . amLODipine  5 mg Oral q morning - 10a  . aspirin EC  81 mg Oral Q breakfast  . atorvastatin  20 mg Oral q1800  . docusate sodium  100 mg Oral QPM  . enoxaparin (LOVENOX) injection  40 mg Subcutaneous Q24H  . lactated ringers   Intravenous STAT  .  levofloxacin  750 mg Oral Daily  . lipase/protease/amylase  2 capsule Oral TID AC  . lisinopril  20 mg Oral QPM  . senna  1 tablet Oral QODAY   Continuous Infusions:    Assessment/Plan: 1. Right HIP FRACTURE:  - admit to telemetry, s/p Pinning R hip 5/18-Increased Dilaudid to scheduled Dilaudid q 2 hourly from prn-Orthopedics to address whether they want PCA.   Touch down weight bearing.  Likely needs SNF c Lift equipment.   2. Nondisplaced Midbody scaphoid #-On sling.  Non-surgical management.   3. Chronic pancreatitis: resume Creon 2 cap tid- his lipase level within normal limits.   4. ? pneumonia: - pneumonia work up ordered. Continue Levafloxacin 750 po daily [blood cultures urine streptococcal antigen and legionella antigen]   repeat CXR in 5/20 am   5. Transitional cell carcinoma: scheduled for IMAGING in the second week of June under Dr Clelia Croft. Outpatient follow up.    6. Hypertension: controlled. Resume Lisinopril 20, Amlodipine 5 mg  7. AAA: recommend outpatient follow up with CT abd and pelvis.   8. Leukocytosis: probably from the pneumonia.   9. Anemia: normocytic. 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  Stable currently   Code Status: Full Family Communication: none at bedside Disposition Plan:  inpatient   Pleas Koch, MD  [facility] Pager [phone] 09/17/2012, 12:16 PM    LOS: 1 day

## 2012-09-17 NOTE — Evaluation (Signed)
Physical Therapy Evaluation Patient Details Name: [name] MRN: [id number] DOB: 06/08/1940 Today's Date: 09/17/2012 Time: 4098-1191 PT Time Calculation (min): 32 min  PT Assessment / Plan / Recommendation Clinical Impression  72 yo male s/p R hip pinning. Sustained R femoral fx and R scaphoid/wrist fx. On eval, pt required +2 assist for mobility. Able to stand with PFRW but unable to pivot at time of eval. Recommend SNF for continued rehab. Recommend daily mobility with nursing with lift equipment for OOB to chair.     PT Assessment  Patient needs continued PT services    Follow Up Recommendations  SNF;Supervision/Assistance - 24 hour    Does the patient have the potential to tolerate intense rehabilitation      Barriers to Discharge        Equipment Recommendations  None recommended by PT    Recommendations for Other Services OT consult   Frequency Min 3X/week    Precautions / Restrictions Precautions Precautions: Fall Restrictions Weight Bearing Restrictions: Yes RUE Weight Bearing: Non weight bearing RLE Weight Bearing: Touchdown weight bearing Other Position/Activity Restrictions: PFRW R UE   Pertinent Vitals/Pain 9/10 R LE, R UE at rest; 10/10 R LE, R UE with activity. RN made aware of pain/need for pain meds. Ice placed on R LE. R LE and R UE elevated/positioned with pillows.      Mobility  Bed Mobility Bed Mobility: Supine to Sit;Sit to Supine Supine to Sit: 1: +2 Total assist;HOB elevated;With rails Supine to Sit: Patient Percentage: 70% Sit to Supine: 1: +2 Total assist;HOB flat Sit to Supine: Patient Percentage: 30% Details for Bed Mobility Assistance: Multimodal cues for safety, technique, hand placement. Assist for R LE off/onto bed/  Utilized bed pad for scooting, positioning. Increased time.  Transfers Transfers: Sit to Stand;Stand to Sit Sit to Stand: 1: +2 Total assist;From elevated surface Sit to Stand: Patient Percentage: 40% Stand to Sit:  1: +2 Total assist;To elevated surface Stand to Sit: Patient Percentage: 40% Details for Transfer Assistance: Multimodal cues for safety, technique, hand placement, WB precautions. Assist to rise, stabilize, control descent. Pt able to achieve full standing but unable to stand or squat pivot. Therapist monitored WBing on R LE by placing foot under pt's  foot.  Ambulation/Gait Ambulation/Gait Assistance: Not tested (comment)    Exercises     PT Diagnosis: Difficulty walking;Generalized weakness;Acute pain  PT Problem List: Decreased strength;Decreased range of motion;Decreased activity tolerance;Decreased balance;Decreased mobility;Decreased knowledge of use of DME;Decreased knowledge of precautions;Pain PT Treatment Interventions: DME instruction;Gait training;Functional mobility training;Therapeutic activities;Therapeutic exercise;Balance training;Patient/family education   PT Goals Acute Rehab PT Goals PT Goal Formulation: With patient Time For Goal Achievement: 10/01/12 Potential to Achieve Goals: Good Pt will go Supine/Side to Sit: with mod assist PT Goal: Supine/Side to Sit - Progress: Goal set today Pt will go Sit to Supine/Side: with mod assist PT Goal: Sit to Supine/Side - Progress: Goal set today Pt will go Sit to Stand: with mod assist PT Goal: Sit to Stand - Progress: Goal set today Pt will Transfer Bed to Chair/Chair to Bed: with mod assist PT Transfer Goal: Bed to Chair/Chair to Bed - Progress: Goal set today Pt will Ambulate: 1 - 15 feet;with mod assist;with rolling walker PT Goal: Ambulate - Progress: Goal set today Pt will Perform Home Exercise Program: with supervision, verbal cues required/provided PT Goal: Perform Home Exercise Program - Progress: Goal set today  Visit Information  Last PT Received On: 09/17/12 Assistance Needed: +2 PT/OT Co-Evaluation/Treatment: Yes  Subjective Data  Subjective: The pain medicine doesn't last long Patient Stated Goal: Regain  independence   Prior Functioning  Home Living Lives With: Alone Available Help at Discharge: Friend(s) Type of Home: Other (Comment) (Townhouse) Home Access: Stairs to enter [employer] of Steps: 1 Home Layout: Able to live on main level with bedroom/bathroom;Two level Bathroom Shower/Tub: Health visitor: Standard Home Adaptive Equipment: Walker - rolling;Bedside commode/3-in-1;Reacher;Shower chair with back Prior Function Level of Independence: Independent with assistive device(s) Able to Take Stairs?: Yes (its difficult) Driving: Yes Communication Communication: No difficulties Dominant Hand: Right    Cognition  Cognition Arousal/Alertness: Suspect due to medications (intermittent closing of eyes) Behavior During Therapy: WFL for tasks assessed/performed Overall Cognitive Status: Within Functional Limits for tasks assessed    Extremity/Trunk Assessment Right Lower Extremity Assessment RLE ROM/Strength/Tone: Unable to fully assess;Due to pain;Deficits RLE ROM/Strength/Tone Deficits: moves ankle well. hip abd 2/5, hip flex 2/5 Left Lower Extremity Assessment LLE ROM/Strength/Tone: Deficits LLE ROM/Strength/Tone Deficits: Decreased knee ROM-flex ~80-90 degrees sitting EOB. Strength 3+/5.    Balance    End of Session PT - End of Session Equipment Utilized During Treatment: Gait belt Activity Tolerance: Patient limited by pain;Patient limited by fatigue Patient left: in bed;with call bell/phone within reach Nurse Communication: Mobility status;Need for lift equipment;Precautions;Weight bearing status;Patient requests pain meds  GP     Rebeca Alert, MPT Pager: [phone]

## 2012-09-18 ENCOUNTER — Inpatient Hospital Stay (HOSPITAL_COMMUNITY): Payer: Medicare Other

## 2012-09-18 LAB — CBC
MCV: 94.9 fL (ref 78.0–100.0)
Platelets: 220 10*3/uL (ref 150–400)
RBC: 2.95 MIL/uL — ABNORMAL LOW (ref 4.22–5.81)
WBC: 9.1 10*3/uL (ref 4.0–10.5)

## 2012-09-18 LAB — BASIC METABOLIC PANEL
CO2: 23 mEq/L (ref 19–32)
Chloride: 101 mEq/L (ref 96–112)
Potassium: 4.4 mEq/L (ref 3.5–5.1)
Sodium: 134 mEq/L — ABNORMAL LOW (ref 135–145)

## 2012-09-18 LAB — PROTIME-INR
INR: 1.69 — ABNORMAL HIGH (ref 0.00–1.49)
Prothrombin Time: 19.3 seconds — ABNORMAL HIGH (ref 11.6–15.2)

## 2012-09-18 MED ORDER — OXYCODONE HCL ER 40 MG PO T12A
40.0000 mg | EXTENDED_RELEASE_TABLET | Freq: Two times a day (BID) | ORAL | Status: DC
  Administered 2012-09-18 – 2012-09-19 (×3): 40 mg via ORAL
  Filled 2012-09-18 (×3): qty 1

## 2012-09-18 MED ORDER — HYDROMORPHONE HCL PF 2 MG/ML IJ SOLN
2.0000 mg | INTRAMUSCULAR | Status: DC | PRN
  Administered 2012-09-20 (×2): 2 mg via INTRAVENOUS
  Filled 2012-09-18 (×2): qty 1

## 2012-09-18 MED ORDER — AMLODIPINE BESYLATE 5 MG PO TABS
5.0000 mg | ORAL_TABLET | Freq: Every morning | ORAL | Status: DC
  Administered 2012-09-19 – 2012-09-20 (×2): 5 mg via ORAL
  Filled 2012-09-18 (×2): qty 1

## 2012-09-18 MED ORDER — OXYCODONE HCL 5 MG PO TABS
10.0000 mg | ORAL_TABLET | ORAL | Status: DC | PRN
  Administered 2012-09-18 – 2012-09-19 (×7): 10 mg via ORAL
  Filled 2012-09-18 (×7): qty 2

## 2012-09-18 NOTE — Progress Notes (Signed)
Pt stable - vss C/o right wrist pain Stood yesterday Plan cast r wrist today Pi may need pinning scaphoid

## 2012-09-18 NOTE — Progress Notes (Signed)
Pharmacy Assisted Dosing for Pain Management  A/P: Patient currently on Dilaudid IV q2h scheduled (total 24mg Derl Barrow). Asked to recommend PO equivalent opioid dosing to help patient transition to home. Patient is also on Percocet as needed here as inpatient as well as at home prior to admission so will use oxycodone in PO transitioning. The current IV dilaudid that patient is receiving is equivalent to approximately 360mg /day of oxycodone. Using approximately 2/3 of that dose to take into account cross effect with IV dilaudid, will use total daily dose of 160mg /day of oxycodone. As per discussion with Dr. Mahala Menghini, will start Oxycontin 40mg  BID and OxyIR 10mg  PO q3prn (total 160mg /day with both forms). Oxycontin can be titrated as per oxyIR usage.    Hessie Knows, PharmD, BCPS Pager [phone] 09/18/2012 10:46 AM

## 2012-09-18 NOTE — Progress Notes (Signed)
Clinical Social Work  Per MD, patient possibly ready to DC tomorrow. CSW met with patient at bedside and provided bed offers. Patient reports he will discuss offers with friends this afternoon and asked CSW to call wife. CSW spoke with wife via phone who reports she will also review offers. Patient and wife aware that decision will need to be made prior to DC. CSW will continue to follow.  Unk Lightning, LCSW (Coverage for [employer])

## 2012-09-18 NOTE — Progress Notes (Signed)
PROGRESS NOTE  [name] ZOX:[id number] DOB: 1941/02/26 DOA: 09/16/2012 PCP: [name], MD  Brief narrative: 72 yr old Male with known stage IV transitional Ca Renal pelvis, H/o ? septic Gall bladder 1997, Alcoholic pancreatitis + chronic pain admitted 5/18 with fall resulting in R scaphoid fracture [ deemed non-operable by Ortho], R Hip Fracture s/p pinning R Hip 5/18.  Past medical history-As per Problem list Chart reviewed as below- Admission 12.16.13 for R lap Nephroureterectomy  Admission 11/15/05 c Chronic pancreatitis with abnormal pancreatic head Admission 11/12/05 with acute/chronic pancreatitis Admission 02/05/05 with pancreatitis Admission 08/11/04 c recurrent pancreatitis Admission 01/23/04 for : Iliac angio  Consultants:  Ortho Dr. August Saucer  Procedures:  R HIP percutaneous pinning 09/16/12  Antibiotics:  None currently   Subjective  Pain more bearable on q 2 hr dilaudid. No appetite still-denies abdominal discomfort. Right now R hand is the most painful of his pain concerns-wonders whether surgery should be perfomred No n/v.    Objective    Interim History: nad  Telemetry: 18 bts Vtach, on moving around briefly today-did not recur  Objective: Filed Vitals:   09/18/12 0000 09/18/12 0400 09/18/12 0532 09/18/12 0800  BP:   106/58   Pulse:   60   Temp:   98.4 F (36.9 C)   TempSrc:   Oral   Resp: 12 14 16 18   Height:      Weight:      SpO2: 93% 99% 100% 96%    Intake/Output Summary (Last 24 hours) at 09/18/12 1010 Last data filed at 09/18/12 1005  Gross per 24 hour  Intake    690 ml  Output    915 ml  Net   -225 ml    Exam:  General: alert pleasant Cardiovascular:  s1 s 2no m/r/g Respiratory: Clear, but coughs up sputum in room Abdomen: soft/Nt/ND Skin nad   Data Reviewed: Basic Metabolic Panel:  Recent Labs Lab 09/16/12 0515 09/17/12 0440 09/18/12 0425  NA 135 133* 134*  K 4.9 4.4 4.4  CL 101 100 101  CO2 23 23 23    GLUCOSE 121* 101* 116*  BUN 22 19 20   CREATININE 1.23 1.17 1.14  CALCIUM 9.4 9.0 9.1   Liver Function Tests:  Recent Labs Lab 09/16/12 0515  AST 28  ALT 14  ALKPHOS 49  BILITOT 0.2*  PROT 7.1  ALBUMIN 3.1*    Recent Labs Lab 09/16/12 0515  LIPASE 20   No results found for this basename: AMMONIA,  in the last 168 hours CBC:  Recent Labs Lab 09/16/12 0515 09/17/12 0440 09/18/12 0425  WBC 12.2* 10.0 9.1  NEUTROABS 10.9*  --   --   HGB 9.8* 9.2* 8.7*  HCT 31.1* 29.7* 28.0*  MCV 93.4 94.3 94.9  PLT 289 249 220   Cardiac Enzymes: No results found for this basename: CKTOTAL, CKMB, CKMBINDEX, TROPONINI,  in the last 168 hours BNP: No components found with this basename: POCBNP,  CBG: No results found for this basename: GLUCAP,  in the last 168 hours  Recent Results (from the past 240 hour(s))  CULTURE, BLOOD (ROUTINE X 2)     Status: None   Collection Time    09/16/12  2:52 PM      Result Value Range Status   Specimen Description BLOOD RIGHT ARM   Final   Special Requests BOTTLES DRAWN AEROBIC AND ANAEROBIC 1CC   Final   Culture  Setup Time 09/16/2012 17:32   Final   Culture  Final   Value:        BLOOD CULTURE RECEIVED NO GROWTH TO DATE CULTURE WILL BE HELD FOR 5 DAYS BEFORE ISSUING A FINAL NEGATIVE REPORT   Report Status PENDING   Incomplete  CULTURE, BLOOD (ROUTINE X 2)     Status: None   Collection Time    09/16/12  3:00 PM      Result Value Range Status   Specimen Description BLOOD RIGHT ARM   Final   Special Requests BOTTLES DRAWN AEROBIC AND ANAEROBIC 4CC   Final   Culture  Setup Time 09/16/2012 17:32   Final   Culture     Final   Value:        BLOOD CULTURE RECEIVED NO GROWTH TO DATE CULTURE WILL BE HELD FOR 5 DAYS BEFORE ISSUING A FINAL NEGATIVE REPORT   Report Status PENDING   Incomplete  CULTURE, EXPECTORATED SPUTUM-ASSESSMENT     Status: None   Collection Time    09/17/12  4:33 PM      Result Value Range Status   Specimen Description  SPUTUM   Final   Special Requests NONE   Final   Sputum evaluation     Final   Value: THIS SPECIMEN IS ACCEPTABLE. RESPIRATORY CULTURE REPORT TO FOLLOW.   Report Status 09/17/2012 FINAL   Final  CULTURE, RESPIRATORY (NON-EXPECTORATED)     Status: None   Collection Time    09/17/12  4:33 PM      Result Value Range Status   Specimen Description SPUTUM   Final   Special Requests NONE   Final   Gram Stain     Final   Value: RARE WBC RARE SQUAMOUS EPITHELIAL CELLS PRESENT     RARE GRAM POSITIVE COCCI     IN CLUSTERS   Culture NO GROWTH   Final   Report Status PENDING   Incomplete     Studies:              All Imaging reviewed and is as per above notation   Scheduled Meds: . [START ON 09/19/2012] amLODipine  5 mg Oral q morning - 10a  . aspirin EC  81 mg Oral Q breakfast  . atorvastatin  20 mg Oral q1800  . docusate sodium  100 mg Oral QPM  . enoxaparin (LOVENOX) injection  40 mg Subcutaneous Q24H  .  HYDROmorphone (DILAUDID) injection  2 mg Intravenous Q2H  . levofloxacin  750 mg Oral Daily  . lipase/protease/amylase  2 capsule Oral TID AC  . lisinopril  20 mg Oral QPM  . senna  1 tablet Oral QODAY   Continuous Infusions:    Assessment/Plan: 1. Right HIP FRACTURE:  - admit to telemetry, s/p Pinning R hip 5/18-Increased Dilaudid to scheduled Dilaudid q 2 hourly from prn-Orthopedics to address whether they want PCA.  ORal equivalent of IV dilaudid ~24 mg/24 hours seems to be about 90 mg of Oxycontin q 4 hourly.  Appreciate PharmD assist in confirming good starting dose.  Touch down weight bearing.  Likely needs SNF c Lift equipment.   2. Nondisplaced Midbody scaphoid #-On sling.  Non-surgical management.  Orthopedics to kindly address if this would be operable-Patient has had a history of pinning of hand joints with good result before   3. Chronic pancreatitis: resume Creon 2 cap tid- his lipase level within normal limits.   4. ? pneumonia: - pneumonia work up ordered. Continue  Levafloxacin 750 po daily [blood cultures urine streptococcal antigen and legionella antigen]  repeat CXR in 5/20 am   5. Transitional cell carcinoma: scheduled for IMAGING in the second week of June under Dr Clelia Croft. Outpatient follow up.    6. Hypertension: controlled. Resume Lisinopril 20, Amlodipine 5 mg-hold parameters placed  7. AAA: recommend outpatient follow up with CT abd and pelvis.   8. Leukocytosis: probably from the pneumonia.   9. Anemia: normocytic. Baseline H&H is around 11, now 8.7.  Woudl rpt in am again.  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  Stable currently   Code Status: Full Family Communication: none at bedside Disposition Plan:  inpatient   Pleas Koch, MD  [facility] Pager [phone] 09/18/2012, 10:10 AM    LOS: 2 days

## 2012-09-18 NOTE — Progress Notes (Signed)
Stopped to see patient today at request of nursing staff. Patient was having treatment and then a few minutes later I tried again to visit but  Patient was sleeping. Will check back later.

## 2012-09-19 DIAGNOSIS — D649 Anemia, unspecified: Secondary | ICD-10-CM

## 2012-09-19 LAB — CBC
MCH: 29.1 pg (ref 26.0–34.0)
MCV: 92.4 fL (ref 78.0–100.0)
Platelets: 228 10*3/uL (ref 150–400)
RDW: 14.5 % (ref 11.5–15.5)

## 2012-09-19 LAB — SYNOVIAL CELL COUNT + DIFF, W/ CRYSTALS
Lymphocytes-Synovial Fld: 18 % (ref 0–20)
Neutrophil, Synovial: 82 % — ABNORMAL HIGH (ref 0–25)

## 2012-09-19 MED ORDER — BUPIVACAINE HCL (PF) 0.5 % IJ SOLN
30.0000 mL | Freq: Once | INTRAMUSCULAR | Status: DC
  Filled 2012-09-19: qty 30

## 2012-09-19 MED ORDER — ENOXAPARIN SODIUM 40 MG/0.4ML ~~LOC~~ SOLN: 40.0000 mg | Freq: Every day | SUBCUTANEOUS | Status: DC

## 2012-09-19 MED ORDER — POLYETHYLENE GLYCOL 3350 17 G PO PACK
17.0000 g | PACK | Freq: Every day | ORAL | Status: DC
  Administered 2012-09-19 – 2012-09-20 (×2): 17 g via ORAL
  Filled 2012-09-19 (×2): qty 1

## 2012-09-19 MED ORDER — OXYCODONE HCL 5 MG PO TABS
20.0000 mg | ORAL_TABLET | ORAL | Status: DC | PRN
  Administered 2012-09-19 – 2012-09-20 (×3): 20 mg via ORAL
  Filled 2012-09-19 (×3): qty 4

## 2012-09-19 MED ORDER — OXYCODONE HCL ER 40 MG PO T12A
60.0000 mg | EXTENDED_RELEASE_TABLET | Freq: Two times a day (BID) | ORAL | Status: DC
  Administered 2012-09-19: 60 mg via ORAL
  Filled 2012-09-19: qty 1

## 2012-09-19 MED ORDER — METHYLPREDNISOLONE ACETATE 40 MG/ML IJ SUSP
40.0000 mg | Freq: Once | INTRAMUSCULAR | Status: DC
  Filled 2012-09-19: qty 1

## 2012-09-19 MED ORDER — LIDOCAINE HCL (PF) 1 % IJ SOLN
5.0000 mL | Freq: Once | INTRAMUSCULAR | Status: DC
  Filled 2012-09-19: qty 5

## 2012-09-19 MED ORDER — OXYCODONE HCL 20 MG PO TABS: 20.0000 mg | ORAL_TABLET | ORAL | Status: DC | PRN

## 2012-09-19 MED ORDER — OXYCODONE HCL ER 60 MG PO T12A: 60.0000 mg | EXTENDED_RELEASE_TABLET | Freq: Two times a day (BID) | ORAL | Status: DC

## 2012-09-19 NOTE — Progress Notes (Signed)
[facility] PROGRESS NOTE  TAYO MAUTE XBM:[id number] DOB: 01/06/1941 DOA: 09/16/2012 PCP: [name], MD  Assessment/Plan: Active Problems:   HYPERTENSION   PANCREATITIS   AAA (abdominal aortic aneurysm)   Anemia   Fall (on)(from) incline, initial encounter   Closed right hip fracture   Leukocytosis, unspecified   CAP (community acquired pneumonia)    1. Right Hip fracture: Patient presented following a mechanical fall, complicated by a nondisplaced right subcapital femoral neck fracture. Dr [name] dean provided orthopedic consultation, and patient is s/p percutaneous pinning on 09/16/12, now POD #3, and patient appears quite stable. Managing per orthopedics.   2. Wrist fracture: In addition to #1 above, patient also sustained a radiologically-confirmed nondisplaced acute fracture of the midbody of the scaphoid and questionable posterior triquetral fracture. Managing conservatively for now, with immobilization and pain control, although Dr August Saucer has mentioned the possibility of pinning in due course.   3. Chronic pancreatitis: Patient has known history of chronic pancreatitis. This appears to be stable/quiescent at this time, on Creon 2 cap TID. Lpase level within normal limits.  4. Query pneumonia: At presentation, patient had a leukocytosis of 12.2, saturations were low, and CXR revealed a faint, small opacity in the right upper lung field, which could reflect a small area of airspace disease or atelectasis. He was treated for CAP, with Levaquin, now day #4, and as of 09/19/12, repeat CXR was devoid of acute findings. Blood cultures urine streptococcal antigen and legionella antigen, all negative. Patient has remained afebrile, has no cough or chest pain, and wcc has normalized. Antibiotic therapy will be discontinued on 09/20/12.  5. Transitional cell carcinoma: Patient follow up with Dr [name] for this, and has imaging studies scheduled in the second week of June/Outpatient  follow up.  6. Hypertension: Controlled on Lisinopril 20, Amlodipine 5 mg.  7. AAA: Asymptomatic. For outpatient follow up.  8. Anemia: Normocytic. 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 Baseline H&H is around 11, now 8.7-8.8. Reasonable. Following CBC.   9. 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 10: OA Knees: Patient has severe bilateral knee osteoarthritis. Per patient and spouse, this is militating against effective ambulation. Will defer to orthopedics.      Code Status: Full Code.  Family Communication:  Disposition Plan: To be determined.   Brief narrative: 72 y.o. 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 On arrival, he was found to have right hip fracture and a right wrist fracture. His oxygen sats were running in the low 90's, he was put on oxygen, with improvement. Dr August Saucer from orthopedics was consulted and he is taken in to OR shortly. Lab work revealed mild renal insufficiency, leukocytosis and anemia. CXR revealed faint opacity in the right upper field representing pneumonia. Admitted for further management.    Consultants:  [name], orthopedic surgeon.   Procedures:  CXR.  X-rays, Pelvis/Right hip.   Antibiotics:  Levaquin 09/16/12>>>  HPI/Subjective: Not in acute pain. Constipated.   Objective: Vital signs in last 24 hours: Temp:  [97.7 F (36.5 C)-99.3 F (37.4 C)] 99.3 F (37.4 C) (05/21 1424) Pulse Rate:  [68-84] 84 (05/21 1424) Resp:  [18-20] 20 (05/21 1424) BP: (131-154)/(69-81) 154/72 mmHg (05/21 1424) SpO2:  [96 %-99 %] 98 % (05/21 1424) Weight change:  Last BM Date: 09/13/12  Intake/Output from previous day: 05/20 0701 - 05/21 0700 In: 1680 [P.O.:1680] Out:  1125 [Urine:1125] Total I/O In: 120 [P.O.:120] Out: 200 [Urine:200]   Physical Exam: General: Comfortable, alert, communicative, fully oriented,  not short of breath at rest.  HEENT:  Moderate clinical pallor, no jaundice, no conjunctival injection or discharge. NECK:  Supple, JVP not seen, no carotid bruits, no palpable lymphadenopathy, no palpable goiter. CHEST:  Clinically clear to auscultation, no wheezes, no crackles. HEART:  Sounds 1 and 2 heard, normal, regular, no murmurs. ABDOMEN:  Full, soft, non-tender, no palpable organomegaly, no palpable masses, normal bowel sounds. GENITALIA:  Not examined. LOWER EXTREMITIES:  No pitting edema, palpable peripheral pulses. UPPER EXTREMITIES: RUE is in cast.   MUSCULOSKELETAL SYSTEM:  Surgical site right hip appears unremarkable. Patient has marked arthritic changes both knees. CENTRAL NERVOUS SYSTEM:  No focal neurologic deficit on gross examination.  Lab Results:  Recent Labs  09/18/12 0425 09/19/12 0500  WBC 9.1 7.3  HGB 8.7* 8.8*  HCT 28.0* 27.9*  PLT 220 228    Recent Labs  09/17/12 0440 09/18/12 0425  NA 133* 134*  K 4.4 4.4  CL 100 101  CO2 23 23  GLUCOSE 101* 116*  BUN 19 20  CREATININE 1.17 1.14  CALCIUM 9.0 9.1   Recent Results (from the past 240 hour(s))  CULTURE, BLOOD (ROUTINE X 2)     Status: None   Collection Time    09/16/12  2:52 PM      Result Value Range Status   Specimen Description BLOOD RIGHT ARM   Final   Special Requests BOTTLES DRAWN AEROBIC AND ANAEROBIC 1CC   Final   Culture  Setup Time 09/16/2012 17:32   Final   Culture     Final   Value:        BLOOD CULTURE RECEIVED NO GROWTH TO DATE CULTURE WILL BE HELD FOR 5 DAYS BEFORE ISSUING A FINAL NEGATIVE REPORT   Report Status PENDING   Incomplete  CULTURE, BLOOD (ROUTINE X 2)     Status: None   Collection Time    09/16/12  3:00 PM      Result Value Range Status   Specimen Description BLOOD RIGHT ARM   Final   Special Requests BOTTLES DRAWN AEROBIC AND ANAEROBIC 4CC   Final   Culture  Setup Time 09/16/2012 17:32   Final   Culture     Final   Value:        BLOOD CULTURE RECEIVED NO  GROWTH TO DATE CULTURE WILL BE HELD FOR 5 DAYS BEFORE ISSUING A FINAL NEGATIVE REPORT   Report Status PENDING   Incomplete  CULTURE, EXPECTORATED SPUTUM-ASSESSMENT     Status: None   Collection Time    09/17/12  4:33 PM      Result Value Range Status   Specimen Description SPUTUM   Final   Special Requests NONE   Final   Sputum evaluation     Final   Value: THIS SPECIMEN IS ACCEPTABLE. RESPIRATORY CULTURE REPORT TO FOLLOW.   Report Status 09/17/2012 FINAL   Final  CULTURE, RESPIRATORY (NON-EXPECTORATED)     Status: None   Collection Time    09/17/12  4:33 PM      Result Value Range Status   Specimen Description SPUTUM   Final   Special Requests NONE   Final   Gram Stain     Final   Value: RARE WBC RARE SQUAMOUS EPITHELIAL CELLS PRESENT     RARE GRAM POSITIVE COCCI     IN CLUSTERS   Culture NORMAL  OROPHARYNGEAL FLORA   Final   Report Status PENDING   Incomplete     Studies/Results: Dg Chest 1 View  09/19/2012   *RADIOLOGY REPORT*  Clinical Data: Leukocytosis.  Productive cough.  CHEST - 1 VIEW  Comparison: 09/16/2012  Findings: The small area of infiltrate in the right midzone has completely resolved.  Lungs are clear except for slight scarring at the left lung base.  Heart size and pulmonary vascularity are normal.  No osseous abnormality.  IMPRESSION:  1.  Clearing of the faint infiltrate the right midzone. 2.  No acute abnormalities.   Original Report Authenticated By: Francene Boyers, M.D.    Medications: Scheduled Meds: . amLODipine  5 mg Oral q morning - 10a  . aspirin EC  81 mg Oral Q breakfast  . atorvastatin  20 mg Oral q1800  . docusate sodium  100 mg Oral QPM  . enoxaparin (LOVENOX) injection  40 mg Subcutaneous Q24H  . levofloxacin  750 mg Oral Daily  . lipase/protease/amylase  2 capsule Oral TID AC  . lisinopril  20 mg Oral QPM  . OxyCODONE  60 mg Oral Q12H  . senna  1 tablet Oral QODAY   Continuous Infusions:  PRN Meds:.HYDROmorphone (DILAUDID) injection,  LORazepam, menthol-cetylpyridinium, methocarbamol (ROBAXIN) IV, methocarbamol, metoCLOPramide (REGLAN) injection, metoCLOPramide, ondansetron (ZOFRAN) IV, ondansetron, oxyCODONE, phenol, prochlorperazine    LOS: 3 days   Betina Puckett,CHRISTOPHER  [facility] Pager [phone]. If 8PM-8AM, please contact night-coverage at [email or web address], password [facility] 09/19/2012, 4:02 PM  LOS: 3 days

## 2012-09-19 NOTE — Progress Notes (Signed)
Patient describes bilateral knee pain which is more than his right wrist pain and right hip pain  Vital signs stable  Patient does have a history of gout remotely last event 20 years ago On exam the patient does have bilateral knee effusions which are moderate to large. This is a new finding. He's not having any fever or chills. Right wrist is casted in that has improved his pain. Right hip has minimal pain with range of motion  Bilateral knees aspirated today with 80 cc of serous slightly cloudy tan to greenish fluid aspirated from the right knee 40 cc on the left knee. This is done after informed consent time out. This appears clinically like gallops. 4 cc of Marcaine 1 cc of Depo-Medrol injected into each knee. I think this will help for gout. Aspiration sent for Gram stain cell count culture and crystals. We'll order uric acid level to be drawn tomorrow. From an orthopedic standpoint and he will be medically ready to transfer to skilled nursing facility. Aspirating the knee should help him to participate more fully in physical therapy.

## 2012-09-19 NOTE — Progress Notes (Signed)
Clinical Social Work  CSW met with patient at bedside. Patient reports he has spoken to friends and wife about SNF placement but no final decision. Patient's wife will call CSW once she has arrived and CSW will meet with patient and wife to discuss offers again.  Unk Lightning, LCSW  (Coverage for [employer])

## 2012-09-19 NOTE — Progress Notes (Signed)
PT Cancellation Note  Patient Details Name: [name] MRN: [id number] DOB: [birth year]   Cancelled Treatment:    Reason Eval/Treat Not Completed: Pain limiting ability to participate  Pt in too much pain early afternoon so RN notified to bring pain meds.  Returned later to work with pt however he states too much bilateral knee pain and unable to move LEs even after receiving meds.  Spouse and pt state pt was receiving injections and believe steroid injections would help with pain at this time.  Notified RN and pt and spouse to discuss with MD as knee pain is limiting mobility status and pt plans to d/c to [facility] for rehab.   [name],KATHrine E 09/19/2012, 3:58 PM Zenovia Jarred, PT, DPT 09/19/2012 Pager: [phone]

## 2012-09-19 NOTE — Progress Notes (Signed)
Clinical Social Work  CSW spoke with patient and wife who chose [facility]. CSW spoke with SNF who is agreeable to admission when medically stable. CSW will continue to follow.  Unk Lightning, LCSW (Coverage for [employer])

## 2012-09-19 NOTE — Progress Notes (Signed)
Pharmacy Assisted Dosing for Pain Management  Patient is currently on  - Dilaudid 2mg  IV q4prn (not received any doses since order) - OxyIR 10 mg q3prn (40 mg on 5/20, and already 30mg  so far today) - Oxycontin 40 mg BID  Given high needs for PRN OxyIR, will increase Oxycontin to 60 mg PO BID and OxyIR to 20 mg IV q4prn to match 100% of scheduled dose.   Pharmacy will f/u  Geoffry Paradise, PharmD, BCPS Pager: [phone] 11:15 AM Pharmacy #: [id number]

## 2012-09-20 ENCOUNTER — Encounter (HOSPITAL_COMMUNITY): Payer: Self-pay | Admitting: Orthopedic Surgery

## 2012-09-20 LAB — CULTURE, RESPIRATORY W GRAM STAIN

## 2012-09-20 LAB — CBC
HCT: 26.9 % — ABNORMAL LOW (ref 39.0–52.0)
MCHC: 33.1 g/dL (ref 30.0–36.0)
Platelets: 248 10*3/uL (ref 150–400)
RDW: 14.6 % (ref 11.5–15.5)
WBC: 8.9 10*3/uL (ref 4.0–10.5)

## 2012-09-20 LAB — PROTIME-INR
INR: 1.47 (ref 0.00–1.49)
Prothrombin Time: 17.4 seconds — ABNORMAL HIGH (ref 11.6–15.2)

## 2012-09-20 LAB — BASIC METABOLIC PANEL
BUN: 25 mg/dL — ABNORMAL HIGH (ref 6–23)
Chloride: 99 mEq/L (ref 96–112)
GFR calc Af Amer: 60 mL/min — ABNORMAL LOW (ref >90)
GFR calc non Af Amer: 52 mL/min — ABNORMAL LOW (ref >90)
Potassium: 4.5 mEq/L (ref 3.5–5.1)
Sodium: 134 mEq/L — ABNORMAL LOW (ref 135–145)

## 2012-09-20 MED ORDER — LORAZEPAM 1 MG PO TABS: 1.5000 mg | ORAL_TABLET | Freq: Three times a day (TID) | ORAL | Status: DC | PRN

## 2012-09-20 NOTE — Progress Notes (Signed)
Physical Therapy Treatment Patient Details Name: [name] MRN: [id number] DOB: [birth year] Today's Date: 09/20/2012 Time: 5784-6962 PT Time Calculation (min): 26 min  PT Assessment / Plan / Recommendation Comments on Treatment Session  Pt premedicated.  Pt able to tolerate pivot to recliner today with +2 assist however fatigues very quickly.  Pt to d/c to SNF today or tomorrow.    Follow Up Recommendations  SNF;Supervision/Assistance - 24 hour     Does the patient have the potential to tolerate intense rehabilitation     Barriers to Discharge        Equipment Recommendations  None recommended by PT    Recommendations for Other Services    Frequency     Plan Discharge plan remains appropriate;Frequency remains appropriate    Precautions / Restrictions Precautions Precautions: Fall Restrictions Weight Bearing Restrictions: Yes RUE Weight Bearing: Non weight bearing RLE Weight Bearing: Touchdown weight bearing Other Position/Activity Restrictions: PFRW R UE   Pertinent Vitals/Pain Premedicated, reports R wrist and R hip pain with mobility, minimal knee pain as well, positioned to comfort    Mobility  Bed Mobility Bed Mobility: Supine to Sit Supine to Sit: 4: Min assist (increased time) Details for Bed Mobility Assistance: increased time required, verbal cues for technique, assist for R LE off bed however pt able to assist once he realized it wasn't hurting to bend knees Transfers Transfers: Sit to Stand;Stand to Sit;Stand Pivot Transfers Sit to Stand: 1: +2 Total assist;From elevated surface;With upper extremity assist;From bed Sit to Stand: Patient Percentage: 30% Stand to Sit: 1: +2 Total assist;To chair/3-in-1;With upper extremity assist Stand to Sit: Patient Percentage: 30% Stand Pivot Transfers: 1: +2 Total assist Stand Pivot Transfers: Patient Percentage: 30% Details for Transfer Assistance: bed elevated high. Assist to place R UE on platform walker. Cues for  LE positioning both sides and to push with L UE on bed.  Pt's R foot placed on top of PT's foot to assist with controlling WBing status, increased verbal cues required for technique and WB, pt fatigued quickly Ambulation/Gait Ambulation/Gait Assistance: Not tested (comment)    Exercises Other Exercises Other Exercises: AAROM R digits flexion/extension to tolerance. Encouraged pt to perform 10 reps throughout the day. Pt educated on how to use L hand to help exercise R digits. Reemphasized need for R UE to be elevated on  pillows. Wife present and will encourage ROm exercises also.    PT Diagnosis:    PT Problem List:   PT Treatment Interventions:     PT Goals Acute Rehab PT Goals Pt will go Supine/Side to Sit: with supervision PT Goal: Supine/Side to Sit - Progress: Updated due to goal met Pt will go Sit to Supine/Side: with supervision PT Goal: Sit to Stand - Progress: Progressing toward goal PT Transfer Goal: Bed to Chair/Chair to Bed - Progress: Progressing toward goal  Visit Information  Last PT Received On: 09/20/12 Assistance Needed: +2 PT/OT Co-Evaluation/Treatment: Yes    Subjective Data  Subjective: My knees still hurt but they feel much better.   Cognition  Cognition Arousal/Alertness: Awake/alert Behavior During Therapy: WFL for tasks assessed/performed Overall Cognitive Status: Within Functional Limits for tasks assessed    Balance     End of Session PT - End of Session Equipment Utilized During Treatment: Gait belt Activity Tolerance: Patient limited by pain;Patient limited by fatigue Patient left: in chair;with call bell/phone within reach Nurse Communication: Need for lift equipment   GP     Porsche Noguchi,KATHrine E 09/20/2012, 1:48  PM Zenovia Jarred, PT, DPT 09/20/2012 Pager: [phone]

## 2012-09-20 NOTE — Progress Notes (Signed)
Clinical Social Work  CSW faxed DC summary to [facility] who is agreeable to admission today. CSW informed patient, wife and RN of DC and all parties agreeable. CSW prepared DC packet with signed FL2 and hard scripts included. CSW coordinated transportation via [place]. CSW is signing off but available if needed.  Unk Lightning, LCSW  (Coverage for [employer])

## 2012-09-20 NOTE — Discharge Summary (Signed)
Physician Discharge Summary  [name]:[id number] DOB: 1941/03/15 DOA: 09/16/2012  PCP: Zachary Petit, MD  Admit date: 09/16/2012 Discharge date: 09/20/2012  Time spent: 40 minutes  Recommendations for Outpatient Follow-up:  1. Follow up with PMD. 2. Follow up with Dr [name], orthopedic surgeon.   Discharge Diagnoses:  Active Problems:   HYPERTENSION   PANCREATITIS   AAA (abdominal aortic aneurysm)   Anemia   Fall (on)(from) incline, initial encounter   Closed right hip fracture   Leukocytosis, unspecified   CAP (community acquired pneumonia)   Discharge Condition: Satisfactory.   Diet recommendation: Heart-Healthy.  Filed Weights   09/16/12 2017  Weight: 90.719 kg (200 lb)    History of present illness:  72 y.o. 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 On arrival, he was found to have right hip fracture and a right wrist fracture. His oxygen sats were running in the low 90's, he was put on oxygen, with improvement. Dr August Saucer from orthopedics was consulted and he is taken in to OR shortly. Lab work revealed mild renal insufficiency, leukocytosis and anemia. CXR revealed faint opacity in the right upper field representing pneumonia. Admitted for further management.    Hospital Course:  1. Right Hip fracture: Patient presented following a mechanical fall, complicated by a nondisplaced right subcapital femoral neck fracture. Dr [name] dean provided orthopedic consultation, and patient is s/p percutaneous pinning on 09/16/12. Post-operative course was unremarkable. For SNF PT/OT/Rehab.  2. Wrist fracture: In addition to #1 above, patient also sustained a radiologically-confirmed nondisplaced acute fracture of the midbody of the scaphoid and questionable posterior triquetral fracture. Managed conservatively for now, with immobilization and pain  control.  3. Chronic pancreatitis: Patient has known history of chronic pancreatitis. This appears to be stable/quiescent at this time, on Creon 2 cap TID. Lipase level was within normal limits.  4. Query pneumonia: At presentation, patient had a leukocytosis of 12.2, saturations were low, and CXR revealed a faint, small opacity in the right upper lung field, which could reflect a small area of airspace disease or atelectasis. He was treated for CAP, with Levaquin, now day #4, and as of 09/19/12, repeat CXR was devoid of acute findings. Blood cultures, urine streptococcal antigen and legionella antigen, all negative. Patient remained afebrile, had no cough or chest pain, and wcc has normalized. Antibiotic therapy was discontinued on 09/20/12.  5. Transitional cell carcinoma: Patient follow up with Dr [name] for this, and has imaging studies scheduled in the second week of June/Outpatient follow up.  6. Hypertension: Controlled on Lisinopril 20, Amlodipine 5 mg.  7. AAA: Asymptomatic. For outpatient follow up.  8. Anemia: Normocytic. 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 Baseline H&H is around 11, now 8.7-8.8. Reasonable. Following CBC.  9. 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  10: OA Knees: Patient has severe bilateral knee osteoarthritis with effusions, and a rome history of gout. Due to complaints of bilateral knee pains, interfering with mobilization, Dr August Saucer performed bilateral knees aspiration on 09/19/12, with removal of 80 cc of serous slightly cloudy tan to greenish fluid from the right knee and 40 cc from the left knee. 4 cc of Marcaine 1 cc of Depo-Medrol injected into each knee. Gram stain was negative, no crystals were found, and wcc was [id number]. Serum uric acid level was 7.4. Patient's knees felt better, thereafter.    Procedures:  See  Below.   Bilateral knee aspirations on 09/19/12.   Consultations: Dr [name], orthopedic surgeon.    Discharge Exam: Filed Vitals:    09/20/12 0400 09/20/12 0646 09/20/12 0727 09/20/12 1147  BP:  113/71    Pulse:  69    Temp:  98.2 F (36.8 C)    TempSrc:  Oral    Resp: 18 20 18 18   Height:      Weight:      SpO2: 94% 95% 95% 95%    General: Comfortable, alert, communicative, fully oriented, not short of breath at rest.  HEENT: Moderate clinical pallor, no jaundice, no conjunctival injection or discharge.  NECK: Supple, JVP not seen, no carotid bruits, no palpable lymphadenopathy, no palpable goiter.  CHEST: Clinically clear to auscultation, no wheezes, no crackles.  HEART: Sounds 1 and 2 heard, normal, regular, no murmurs.  ABDOMEN: Full, soft, non-tender, no palpable organomegaly, no palpable masses, normal bowel sounds.  GENITALIA: Not examined.  LOWER EXTREMITIES: No pitting edema, palpable peripheral pulses.  UPPER EXTREMITIES: RUE is in cast.  MUSCULOSKELETAL SYSTEM: Surgical site right hip appears unremarkable. Patient has marked arthritic changes both knees.  CENTRAL NERVOUS SYSTEM: No focal neurologic deficit on gross examination.  Discharge Instructions      Discharge Orders   Future Appointments Provider Department Dept Phone   10/10/2012 10:00 AM Delcie Roch [facility] MEDICAL ONCOLOGY [phone]   10/10/2012 11:00 AM [facility] [phone]   Patient to arrive 15 minutes prior to appointment time. Patient to pick up oral contrast at least 1 day prior to exam, unless otherwise instructed by your physician. No solid food 4 hours prior to exam. Liquids and Medicines are okay.   10/12/2012 10:30 AM Benjiman Core, MD [facility] MEDICAL ONCOLOGY [phone]   08/15/2013 8:30 AM [facility] [phone]   Eat a light meal the night before the exam but please avoid gaseous foods.   Nothing to eat or drink for at least 8 hours prior to the exam. No gum chewing or smoking the morning of the  exam. Please take your morning medications with small sips of water, especially blood pressure medication. If you have several vascular lab exams and will see physician, please bring a snack with you.   08/15/2013 9:00 AM Sherren Kerns, MD [facility] [phone]   Future Orders Complete By Expires     Diet - low sodium heart healthy  As directed     Increase activity slowly  As directed         Medication List    STOP taking these medications       HYDROcodone-acetaminophen 7.5-325 MG per tablet  Commonly known as:  NORCO     oxyCODONE-acetaminophen 10-325 MG per tablet  Commonly known as:  PERCOCET      TAKE these medications       amLODipine 5 MG tablet  Commonly known as:  NORVASC  Take 5 mg by mouth every morning.     aspirin 81 MG tablet  Take 81 mg by mouth every morning.     docusate sodium 100 MG capsule  Commonly known as:  COLACE  Take 100 mg by mouth every evening.     enoxaparin 40 MG/0.4ML injection  Commonly known as:  LOVENOX  Inject 0.4 mLs (40 mg total) into the skin daily.     lipase/protease/amylase 40347 UNITS Cpep  Commonly known as:  CREON  Take 2 capsules by mouth 3 (three) times daily before meals.     lisinopril 20 MG tablet  Commonly known as:  PRINIVIL,ZESTRIL  Take 20 mg by mouth every evening.     LORazepam 1 MG tablet  Commonly known as:  ATIVAN  Take 1.5 tablets (1.5 mg total) by mouth every 8 (eight) hours as needed for anxiety.     Oxycodone HCl 20 MG Tabs  Take 1 tablet (20 mg total) by mouth every 4 (four) hours as needed.     OxyCODONE HCl ER 60 MG T12a  Take 60 mg by mouth every 12 (twelve) hours.     prochlorperazine 10 MG tablet  Commonly known as:  COMPAZINE  Take 10 mg by mouth every 6 (six) hours as needed (for nausea).     rosuvastatin 10 MG tablet  Commonly known as:  CRESTOR  Take 10 mg by mouth every evening.     senna 8.6 MG Tabs  Commonly known as:  SENOKOT  Take 1 tablet  by mouth every other day.       No Known Allergies Follow-up Information   Follow up with Cammy Copa, MD In 100 days.   Contact information:   [address] [place] [address] [phone]       Schedule an appointment as soon as possible for a visit with Zachary Petit, MD.   Contact information:   [address] [place] [address] [phone]        The results of significant diagnostics from this hospitalization (including imaging, microbiology, ancillary and laboratory) are listed below for reference.    Significant Diagnostic Studies: Dg Chest 1 View  09/19/2012   *RADIOLOGY REPORT*  Clinical Data: Leukocytosis.  Productive cough.  CHEST - 1 VIEW  Comparison: 09/16/2012  Findings: The small area of infiltrate in the right midzone has completely resolved.  Lungs are clear except for slight scarring at the left lung base.  Heart size and pulmonary vascularity are normal.  No osseous abnormality.  IMPRESSION:  1.  Clearing of the faint infiltrate the right midzone. 2.  No acute abnormalities.   Original Report Authenticated By: Francene Boyers, M.D.   Dg Wrist Complete Right  09/16/2012   *RADIOLOGY REPORT*  Clinical Data: Fall.  Right wrist pain.  Limited range of motion.  RIGHT WRIST - COMPLETE 3+ VIEW  Comparison: None.  Findings: Nondisplaced fracture of the midbody of the scaphoid is best observed on the dedicated scaphoid view.  Old fracture or unfused secondary ossification center of the ulnar styloid noted.  Slight irregularity of the radial styloid is probably incidental and less likely to represent radial styloid fracture.  Linear lucency projecting posteriorly over the proximal carpal row may be related to the scaphoid fracture or conceivably a small triquetral fracture.  IMPRESSION:  1.  Nondisplaced acute fracture of the midbody of the scaphoid. 2.  Questionable posterior triquetral fracture. 3.  Slightly irregular radial styloid is probably  intact. 4.  Old fracture or unfused secondary ossification center of the ulnar styloid.   Original Report Authenticated By: Gaylyn Rong, M.D.   Dg Hip Complete Right  09/17/2012   *RADIOLOGY REPORT*  Clinical Data: Status post right hip surgery  RIGHT HIP - COMPLETE 2+ VIEW  Comparison: Sep 16, 2012.  Findings: Status post internal fixation of proximal right femoral fracture.  Three surgical screws are noted and in grossly good position.  No dislocation is noted.  Good alignment of fracture components is noted.  Mild narrowing of right hip joint is noted consistent with degenerative joint disease.  IMPRESSION: Status post internal fixation of right proximal femoral fracture.   Original Report Authenticated By: Lupita Raider.,  M.D.   Dg Hip Complete Right  09/16/2012   *RADIOLOGY REPORT*  Clinical Data: Fall.  Right hip pain.  RIGHT HIP - COMPLETE 2+ VIEW  Comparison: 07/13/2012  Findings: Asymmetric degenerative arthropathy of the right hip noted with loss of articular space and asymmetric right femoral head spurring, similar to that shown on 07/13/2012.  The degree of spurring at the junction of the femoral head and neck reduces sensitivity for nondisplaced fracture.  No definite fracture observed.  IMPRESSION:  1.  Asymmetric osteoarthritis of the right hip as shown on prior exam from March.  No definite fracture, but the degree of spurring along the junction of the femoral head and neck might reduce sensitivity for subtle fractures.  If the patient is unable to bear weight, MRI or CT would likely be warranted.   Original Report Authenticated By: Gaylyn Rong, M.D.   Dg Hip Operative Right  09/16/2012   *RADIOLOGY REPORT*  Clinical Data: Hip fixation.  DG OPERATIVE RIGHT HIP  Comparison: CT of earlier in the day  Findings: Screw fixation of the previous described subcapital femoral neck fracture. No acute hardware complication.  Alignment is anatomic.  IMPRESSION: Fixation of subcapital  fracture proximal right femur.   Original Report Authenticated By: Jeronimo Greaves, M.D.   Ct Head Wo Contrast  09/16/2012   *RADIOLOGY REPORT*  Clinical Data: Fall  CT HEAD WITHOUT CONTRAST  Technique:  Contiguous axial images were obtained from the base of the skull through the vertex without contrast.  Comparison: None.  Findings: Mild chronic ischemic changes in the periventricular white matter.  Mild global atrophy.  No mass effect, midline shift, or acute intracranial hemorrhage.  Mastoid air cells are clear. Minimal mucosal thickening in the posterior maxillary sinuses. Minimal fluid in the left sphenoid sinus.  IMPRESSION: Mild inflammatory changes in the paranasal sinuses.  Otherwise, no acute intracranial pathology.   Original Report Authenticated By: Jolaine Click, M.D.   Dg Pelvis Portable  09/16/2012   *RADIOLOGY REPORT*  Clinical Data: Postop for right hip fixation.  PORTABLE PELVIS  Comparison: CT 09/16/2012  Findings: Screw fixation of proximal right femur for subcapital femoral fracture.  No acute hardware complication.  Sacroiliac joints are symmetric.  IMPRESSION: Internal fixation of proximal right femoral fracture.   Original Report Authenticated By: Jeronimo Greaves, M.D.   Ct Hip Right Wo Contrast  09/16/2012   *RADIOLOGY REPORT*  Clinical Data: Fall.  Right hip pain.  CT OF THE RIGHT HIP WITHOUT CONTRAST  Technique:  Multidetector CT imaging was performed according to the standard protocol. Multiplanar CT image reconstructions were also generated.  Comparison: 09/16/2012  Findings: CT reveals cortical discontinuity compatible with fracture across the right femoral neck, primarily subcapital. Degenerative spurring of the right femoral head is also present with loss of articular space in the right hip joint. Degenerative subcortical cyst formation noted in the acetabular rim.  Chronic presacral edema as shown on prior CT scan.  IMPRESSION:  1.  Acute subcapital right femoral neck fracture.   Underlying moderate to prominent degenerative right hip arthropathy.   Original Report Authenticated By: Gaylyn Rong, M.D.   Dg Chest Port 1 View  09/16/2012   *RADIOLOGY REPORT*  Clinical Data: Low oxygen saturation  PORTABLE CHEST - 1  VIEW  Comparison: Chest radiograph 11/29/2011 and chest CT 07/13/2012  Findings: Stable mild cardiomegaly.  Tortuous thoracic aorta contour is stable.  Hilar contours within normal limits.  Faint small patchy opacity in the right upper lung field.  Minimal atelectasis or scarring in the lingula.  No visible pleural effusion or pneumothorax.  IMPRESSION:  1.  Faint, small opacity in the right upper lung field.  This could reflect a small area of airspace disease or atelectasis.  If there is clinical concern for pneumonia, short-term follow-up PA and lateral chest radiograph may be useful. 2.  Lingular atelectasis or scarring. 3.  Cardiomegaly without evidence of failure.   Original Report Authenticated By: Britta Mccreedy, M.D.    Microbiology: Recent Results (from the past 240 hour(s))  CULTURE, BLOOD (ROUTINE X 2)     Status: None   Collection Time    09/16/12  2:52 PM      Result Value Range Status   Specimen Description BLOOD RIGHT ARM   Final   Special Requests BOTTLES DRAWN AEROBIC AND ANAEROBIC 1CC   Final   Culture  Setup Time 09/16/2012 17:32   Final   Culture     Final   Value:        BLOOD CULTURE RECEIVED NO GROWTH TO DATE CULTURE WILL BE HELD FOR 5 DAYS BEFORE ISSUING A FINAL NEGATIVE REPORT   Report Status PENDING   Incomplete  CULTURE, BLOOD (ROUTINE X 2)     Status: None   Collection Time    09/16/12  3:00 PM      Result Value Range Status   Specimen Description BLOOD RIGHT ARM   Final   Special Requests BOTTLES DRAWN AEROBIC AND ANAEROBIC 4CC   Final   Culture  Setup Time 09/16/2012 17:32   Final   Culture     Final   Value:        BLOOD CULTURE RECEIVED NO GROWTH TO DATE CULTURE WILL BE HELD FOR 5 DAYS BEFORE ISSUING A FINAL NEGATIVE  REPORT   Report Status PENDING   Incomplete  CULTURE, EXPECTORATED SPUTUM-ASSESSMENT     Status: None   Collection Time    09/17/12  4:33 PM      Result Value Range Status   Specimen Description SPUTUM   Final   Special Requests NONE   Final   Sputum evaluation     Final   Value: THIS SPECIMEN IS ACCEPTABLE. RESPIRATORY CULTURE REPORT TO FOLLOW.   Report Status 09/17/2012 FINAL   Final  CULTURE, RESPIRATORY (NON-EXPECTORATED)     Status: None   Collection Time    09/17/12  4:33 PM      Result Value Range Status   Specimen Description SPUTUM   Final   Special Requests NONE   Final   Gram Stain     Final   Value: RARE WBC RARE SQUAMOUS EPITHELIAL CELLS PRESENT     RARE GRAM POSITIVE COCCI     IN CLUSTERS   Culture NORMAL OROPHARYNGEAL FLORA   Final   Report Status 09/20/2012 FINAL   Final  BODY FLUID CULTURE     Status: None   Collection Time    09/19/12  6:50 PM      Result Value Range Status   Specimen Description KNEE RIGHT   Final   Special Requests NONE   Final   Gram Stain     Final   Value: FEW WBC PRESENT, PREDOMINANTLY PMN     NO ORGANISMS SEEN  Culture NO GROWTH   Final   Report Status PENDING   Incomplete  BODY FLUID CULTURE     Status: None   Collection Time    09/19/12  7:03 PM      Result Value Range Status   Specimen Description KNEE LEFT   Final   Special Requests NONE   Final   Gram Stain     Final   Value: MODERATE WBC PRESENT, PREDOMINANTLY PMN     NO ORGANISMS SEEN   Culture NO GROWTH   Final   Report Status PENDING   Incomplete     Labs: Basic Metabolic Panel:  Recent Labs Lab 09/16/12 0515 09/17/12 0440 09/18/12 0425 09/20/12 0537  NA 135 133* 134* 134*  K 4.9 4.4 4.4 4.5  CL 101 100 101 99  CO2 23 23 23 25   GLUCOSE 121* 101* 116* 156*  BUN 22 19 20  25*  CREATININE 1.23 1.17 1.14 1.32  CALCIUM 9.4 9.0 9.1 9.3   Liver Function Tests:  Recent Labs Lab 09/16/12 0515  AST 28  ALT 14  ALKPHOS 49  BILITOT 0.2*  PROT 7.1   ALBUMIN 3.1*    Recent Labs Lab 09/16/12 0515  LIPASE 20   No results found for this basename: AMMONIA,  in the last 168 hours CBC:  Recent Labs Lab 09/16/12 0515 09/17/12 0440 09/18/12 0425 09/19/12 0500 09/20/12 0537  WBC 12.2* 10.0 9.1 7.3 8.9  NEUTROABS 10.9*  --   --   --   --   HGB 9.8* 9.2* 8.7* 8.8* 8.9*  HCT 31.1* 29.7* 28.0* 27.9* 26.9*  MCV 93.4 94.3 94.9 92.4 91.2  PLT 289 249 220 228 248   Cardiac Enzymes: No results found for this basename: CKTOTAL, CKMB, CKMBINDEX, TROPONINI,  in the last 168 hours BNP: BNP (last 3 results) No results found for this basename: PROBNP,  in the last 8760 hours CBG: No results found for this basename: GLUCAP,  in the last 168 hours     Signed:  Douglass Dunshee,CHRISTOPHER  [facility] 09/20/2012, 12:43 PM

## 2012-09-20 NOTE — Progress Notes (Addendum)
Occupational Therapy Treatment Patient Details Name: [name] MRN: [id number] DOB: May 01, 1941 Today's Date: 09/20/2012 Time: 7829-5621 OT Time Calculation (min): 28 min  OT Assessment / Plan / Recommendation Comments on Treatment Session Pt is s/p fall with R femoral fracture and R wrist fracture. Pt underwent repair for R hip fracture. Pt is tolerating session better with regards to pain. Does fatigue easily with activity but is motivated.     Follow Up Recommendations  SNF;Supervision/Assistance - 24 hour    Barriers to Discharge       Equipment Recommendations  3 in 1 bedside comode    Recommendations for Other Services    Frequency Min 2X/week   Plan Discharge plan remains appropriate    Precautions / Restrictions Precautions Precautions: Fall Restrictions Weight Bearing Restrictions: Yes RUE Weight Bearing: Non weight bearing RLE Weight Bearing: Touchdown weight bearing Other Position/Activity Restrictions: PFRW R UE   Pain: pt premedicated prior to session and states pain is better today.     ADL  Toilet Transfer: Simulated;+2 Total assistance Toilet Transfer: Patient Percentage: 30% Toilet Transfer Method: Stand pivot (with PFRW) Equipment Used: Other (comment) (PFRW) ADL Comments: Cotx with PT. Pt fatigues quickly with activity but tolerating tasks better in terms of pain. Pt with difficulty maintaining TDWB for R LE. Encouraged ROM for R digits. see exercise section    OT Diagnosis:    OT Problem List:   OT Treatment Interventions:     OT Goals ADL Goals ADL Goal: Toilet Transfer - Progress: Progressing toward goals  Visit Information  Last OT Received On: 09/20/12 Assistance Needed: +2 PT/OT Co-Evaluation/Treatment: Yes    Subjective Data  Subjective: I feel better today Patient Stated Goal: wants to get up and do what he can   Prior Functioning       Cognition  Cognition Arousal/Alertness: Awake/alert (initially sleepy upon arrival and  at end of session) Behavior During Therapy: WFL for tasks assessed/performed Overall Cognitive Status: Within Functional Limits for tasks assessed    Mobility  Bed Mobility Bed Mobility: Supine to Sit Supine to Sit: 4: Min assist (increased time) Transfers Transfers: Sit to Stand;Stand to Sit Sit to Stand: 1: +2 Total assist;From elevated surface;With upper extremity assist;From bed Sit to Stand: Patient Percentage: 30% Stand to Sit: 1: +2 Total assist;To chair/3-in-1;With upper extremity assist Stand to Sit: Patient Percentage: 30% Details for Transfer Assistance: bed elevated high. Assist to place R UE on platform walker. Cues for LE positioning both sides and to push with L UE on bed.     Exercises  Other Exercises Other Exercises: AAROM R digits flexion/extension to tolerance. Encouraged pt to perform 10 reps throughout the day. Pt educated on how to use L hand to help exercise R digits. Reemphasized need for R UE to be elevated on  pillows. Wife present and will encourage ROm exercises also.    Balance     End of Session OT - End of Session Equipment Utilized During Treatment: Gait belt Activity Tolerance: Patient limited by fatigue Patient left: in chair;with call bell/phone within reach;with family/visitor present;Other (comment) (lift pad in place)  GO     Lennox Laity [phone] 09/20/2012, 1:26 PM

## 2012-09-21 ENCOUNTER — Telehealth: Payer: Self-pay | Admitting: Internal Medicine

## 2012-09-21 NOTE — Telephone Encounter (Signed)
PT wife called and stated that the PT has fallen and broken both his hip and wrist, and is in rehab. She stated that she would like to speak with you about managing his medications. Please assist.

## 2012-09-21 NOTE — Telephone Encounter (Signed)
Pt fell and broke his hip and was discharged to [facility] yesterday.  Pt just wanted to let you know

## 2012-09-22 LAB — CULTURE, BLOOD (ROUTINE X 2)

## 2012-09-23 LAB — BODY FLUID CULTURE: Culture: NO GROWTH

## 2012-09-25 ENCOUNTER — Telehealth: Payer: Self-pay | Admitting: *Deleted

## 2012-09-25 NOTE — Telephone Encounter (Signed)
Wife calling to say patient fell 09-16-12. Fractured right wrist and right hip. Is in [facility], getting physical therapy. He has a CT scan scheduled for June 13th. Should they keep the scan appt, for now?

## 2012-09-26 ENCOUNTER — Encounter: Payer: Self-pay | Admitting: *Deleted

## 2012-09-26 ENCOUNTER — Non-Acute Institutional Stay (SKILLED_NURSING_FACILITY): Payer: Medicare Other | Admitting: Internal Medicine

## 2012-09-26 DIAGNOSIS — I150 Renovascular hypertension: Secondary | ICD-10-CM

## 2012-09-26 DIAGNOSIS — S62101S Fracture of unspecified carpal bone, right wrist, sequela: Secondary | ICD-10-CM

## 2012-09-26 DIAGNOSIS — S42309S Unspecified fracture of shaft of humerus, unspecified arm, sequela: Secondary | ICD-10-CM

## 2012-09-26 DIAGNOSIS — S72009S Fracture of unspecified part of neck of unspecified femur, sequela: Secondary | ICD-10-CM

## 2012-09-26 DIAGNOSIS — N182 Chronic kidney disease, stage 2 (mild): Secondary | ICD-10-CM

## 2012-09-26 DIAGNOSIS — S72001S Fracture of unspecified part of neck of right femur, sequela: Secondary | ICD-10-CM

## 2012-09-26 NOTE — Progress Notes (Signed)
Per dr Clelia Croft, keep regularly scheduled appt for CT scan, if patient has recuperated from hip and wrist fractures, if not they may cancel. Wife joyce  Notified.

## 2012-09-27 ENCOUNTER — Telehealth: Payer: Self-pay | Admitting: *Deleted

## 2012-09-27 NOTE — Telephone Encounter (Signed)
Pt called and stated that he is at [facility] now for rehab from hip replacement.  Dr August Saucer dx'd pt with gout in his knees and he is in so much pain it is preventing him from rehab.  Pt states that no medication was given to him for the gout and he is wanting Dr Cato Mulligan to contact Dr August Saucer to find out what the plan is.  I instructed pt that Dr Cato Mulligan is out of the office and he should contact Dr Diamantina Providence office for the gout medicine since he was the one to dx it.  Told pt to call back if he had issues with getting medicine.  Pt verbalized understanding and had no questions.

## 2012-09-28 ENCOUNTER — Telehealth: Payer: Self-pay | Admitting: *Deleted

## 2012-09-28 NOTE — Telephone Encounter (Signed)
Wife calling to ask if patient can be called with results of CT scan instead of coming in for an appt to see dr  Clelia Croft. Per dr Clelia Croft. He will be happy to call patient.

## 2012-10-01 ENCOUNTER — Other Ambulatory Visit: Payer: Self-pay | Admitting: *Deleted

## 2012-10-09 ENCOUNTER — Other Ambulatory Visit: Payer: Medicare Other | Admitting: Lab

## 2012-10-10 ENCOUNTER — Encounter: Payer: Self-pay | Admitting: *Deleted

## 2012-10-10 ENCOUNTER — Encounter (HOSPITAL_COMMUNITY): Payer: Self-pay

## 2012-10-10 ENCOUNTER — Other Ambulatory Visit: Payer: Self-pay | Admitting: *Deleted

## 2012-10-10 ENCOUNTER — Ambulatory Visit (HOSPITAL_COMMUNITY)
Admission: RE | Admit: 2012-10-10 | Discharge: 2012-10-10 | Disposition: A | Discharge status: Home / Self Care | Payer: Medicare Other | Source: Ambulatory Visit | Attending: Oncology | Admitting: Oncology

## 2012-10-10 ENCOUNTER — Other Ambulatory Visit (HOSPITAL_BASED_OUTPATIENT_CLINIC_OR_DEPARTMENT_OTHER): Payer: Medicare Other | Admitting: Lab

## 2012-10-10 DIAGNOSIS — Z905 Acquired absence of kidney: Secondary | ICD-10-CM | POA: Insufficient documentation

## 2012-10-10 DIAGNOSIS — K861 Other chronic pancreatitis: Secondary | ICD-10-CM | POA: Insufficient documentation

## 2012-10-10 DIAGNOSIS — I517 Cardiomegaly: Secondary | ICD-10-CM | POA: Insufficient documentation

## 2012-10-10 DIAGNOSIS — I714 Abdominal aortic aneurysm, without rupture, unspecified: Secondary | ICD-10-CM | POA: Insufficient documentation

## 2012-10-10 DIAGNOSIS — C659 Malignant neoplasm of unspecified renal pelvis: Secondary | ICD-10-CM

## 2012-10-10 DIAGNOSIS — C679 Malignant neoplasm of bladder, unspecified: Secondary | ICD-10-CM | POA: Insufficient documentation

## 2012-10-10 DIAGNOSIS — R599 Enlarged lymph nodes, unspecified: Secondary | ICD-10-CM | POA: Insufficient documentation

## 2012-10-10 LAB — COMPREHENSIVE METABOLIC PANEL (CC13)
CO2: 25 mEq/L (ref 22–29)
Creatinine: 1.6 mg/dL — ABNORMAL HIGH (ref 0.7–1.3)
Glucose: 119 mg/dl — ABNORMAL HIGH (ref 70–99)
Total Bilirubin: 0.34 mg/dL (ref 0.20–1.20)

## 2012-10-10 LAB — CBC WITH DIFFERENTIAL/PLATELET
Eosinophils Absolute: 0 10*3/uL (ref 0.0–0.5)
HCT: 29.3 % — ABNORMAL LOW (ref 38.4–49.9)
LYMPH%: 6.4 % — ABNORMAL LOW (ref 14.0–49.0)
MCHC: 33 g/dL (ref 32.0–36.0)
MCV: 89.6 fL (ref 79.3–98.0)
MONO#: 0.6 10*3/uL (ref 0.1–0.9)
MONO%: 5.4 % (ref 0.0–14.0)
NEUT#: 9.9 10*3/uL — ABNORMAL HIGH (ref 1.5–6.5)
NEUT%: 87.3 % — ABNORMAL HIGH (ref 39.0–75.0)
Platelets: 397 10*3/uL (ref 140–400)
WBC: 11.4 10*3/uL — ABNORMAL HIGH (ref 4.0–10.3)

## 2012-10-10 MED ORDER — LORAZEPAM 0.5 MG PO TABS: ORAL_TABLET | ORAL | Status: DC

## 2012-10-10 MED ORDER — IOHEXOL 300 MG/ML  SOLN
100.0000 mL | Freq: Once | INTRAMUSCULAR | Status: AC | PRN
  Administered 2012-10-10: 100 mL via INTRAVENOUS

## 2012-10-12 ENCOUNTER — Ambulatory Visit: Payer: Medicare Other | Admitting: Oncology

## 2012-10-18 DIAGNOSIS — N182 Chronic kidney disease, stage 2 (mild): Secondary | ICD-10-CM | POA: Insufficient documentation

## 2012-10-18 DIAGNOSIS — S62109A Fracture of unspecified carpal bone, unspecified wrist, initial encounter for closed fracture: Secondary | ICD-10-CM | POA: Insufficient documentation

## 2012-10-18 DIAGNOSIS — I150 Renovascular hypertension: Secondary | ICD-10-CM | POA: Insufficient documentation

## 2012-10-18 NOTE — Progress Notes (Signed)
Patient ID: [name], male   DOB: 06-26-1940, 72 y.o.   MRN: [id number]        HISTORY & PHYSICAL  DATE: 09/26/2012   FACILITY: [facility]  LEVEL OF CARE: SNF (31)  ALLERGIES:  No Known Allergies  CHIEF COMPLAINT:  Manage right femoral hip fracture, right wrist fracture, and renovascular hypertension.    HISTORY OF PRESENT ILLNESS:  The patient is a 72 year-old, Caucasian male.    HIP FRACTURE: The patient had a mechanical fall and sustained a femur fracture.  Patient subsequently underwent surgical repair and tolerated the procedure well. Patient is admitted to this facility for short-term rehabilitation. Patient denies hip pain currently. No complications reported from the pain medications currently being used.   RIGHT WRIST FRACTURE:  Radiographs confirmed nondisplaced acute fracture of the mid body of the scaphoid and questionable posterior triquetral fracture.  Conservative management was recommended and he  currently has a cast.  He denies right wrist pain.    HTN: Pt 's HTN remains stable.  Denies CP, sob, DOE, pedal edema, headaches, dizziness or visual disturbances.  No complications from the medications currently being used.  Last BP :  107/70, 109/64, 117/54.  PAST MEDICAL HISTORY :  Past Medical History  Diagnosis Date  . Hx of colonic polyps   . Diverticulosis   . Hyperlipidemia   . AAA (abdominal aortic aneurysm) last ct 03-02-11    4.6cm  per note from dr fields w/ chart  . Substance abuse hx alcohol abuse  . Alcohol-induced chronic pancreatitis   . Hypertension stress test 1996- states wnl  . Peripheral vascular disease     occlusion left external iliac artery   . Gross hematuria     freq/ urge/ nocturia  . Arthritis     knees  . Peripheral arterial disease   . Transitional cell carcinoma     kidney, ureter, bladder stage 4    PAST SURGICAL HISTORY: Past Surgical History  Procedure Laterality Date  . Knee surgery  1961    left   . Wrist fracture surgery  2010    orif left wrist  . Cataract extraction w/ intraocular lens  implant, bilateral  1998  . Hernia repair  2010    Left inguinal herniorraphy with mesh  . Cystoscopy w/ retrogrades  03/14/2011    Procedure: CYSTOSCOPY WITH RETROGRADE PYELOGRAM;  Surgeon: Valetta Fuller, MD;  Location: [facility];  Service: Urology;  Laterality: Bilateral;  . Cystoscopy with biopsy  03/14/2011    Procedure: CYSTOSCOPY WITH BIOPSY;  Surgeon: Valetta Fuller, MD;  Location: [facility];  Service: Urology;  Laterality: N/A;  c arm   . Cystoscopy w/ retrogrades  11/07/2011    Procedure: CYSTOSCOPY WITH RETROGRADE PYELOGRAM;  Surgeon: Valetta Fuller, MD;  Location: [facility];  Service: Urology;  Laterality: Bilateral;  CYSTOSCOPY, (B) RETROGRADE PYELOGRAM, RIGHT FLEXIBLE URETEROSCOPY, RIGHT JJ STENT    . Cystoscopy w/ ureteral stent placement  11/07/2011    Procedure: CYSTOSCOPY WITH STENT REPLACEMENT;  Surgeon: Valetta Fuller, MD;  Location: [facility];  Service: Urology;  Laterality: Right;  . Cholecystectomy   July 1997- laparoscopic    complications of infection, w/ additional surg's 4 minor and 3 major  . Robot assited laparoscopic nephroureterectomy  04/16/2012    Procedure: ROBOT ASSITED LAPAROSCOPIC NEPHROURETERECTOMY;  Surgeon: Crecencio Mc, MD;  Location: [facility];  Service: Urology;  Laterality: Right;  .  Lymphadenectomy  04/16/2012    Procedure: LYMPHADENECTOMY;  Surgeon: Crecencio Mc, MD;  Location: [facility];  Service: Urology;  Laterality: Right;  RETROPERITONEAL LYMPHADENECTOMY  . Hip pinning,cannulated Right 09/16/2012    Procedure: CANNULATED HIP PINNING;  Surgeon: Cammy Copa, MD;  Location: [facility];  Service: Orthopedics;  Laterality: Right;    SOCIAL HISTORY:  reports that he quit smoking about 9 years ago. His smoking use included Cigarettes. He has a 100 pack-year smoking history. He has never used  smokeless tobacco. He reports that he does not drink alcohol or use illicit drugs.  FAMILY HISTORY:  Family History  Problem Relation Age of Onset  . Heart disease Mother 41  . Heart attack Father 72  . Heart disease Father   . Hypertension Brother     CURRENT MEDICATIONS: Reviewed per [facility]  REVIEW OF SYSTEMS:   GI:  Complains of constipation.    See HPI otherwise 14 point ROS is negative.  PHYSICAL EXAMINATION  VS:  T 97.5       P 68      RR 18       BP 109/64       POX 94%        WT (Lb)  GENERAL: no acute distress, normal body habitus EYES: conjunctivae normal, sclerae normal, normal eye lids MOUTH/THROAT: lips without lesions,no lesions in the mouth,tongue is without lesions,uvula elevates in midline NECK: supple, trachea midline, no neck masses, no thyroid tenderness, no thyromegaly LYMPHATICS: no LAN in the neck, no supraclavicular LAN RESPIRATORY: breathing is even & unlabored, BS CTAB CARDIAC: RRR, no murmur,no extra heart sounds, no edema GI:  ABDOMEN: abdomen soft, normal BS, no masses, no tenderness  LIVER/SPLEEN: no hepatomegaly, no splenomegaly MUSCULOSKELETAL: HEAD: normal to inspection & palpation BACK: no kyphosis, scoliosis or spinal processes tenderness EXTREMITIES: LEFT UPPER EXTREMITY: full range of motion, normal strength & tone RIGHT UPPER EXTREMITY:  full range of motion, normal strength & tone LEFT LOWER EXTREMITY:  full range of motion, normal strength & tone RIGHT LOWER EXTREMITY: strength intact, range of motion minimal  PSYCHIATRIC: the patient is alert & oriented to person, affect & behavior appropriate  LABS/RADIOLOGY: Blood culture x2 showed no growth.    Respiratory culture showed normal oropharyngeal flora.     Right knee aspiration showed no growth.    Glucose 156, BUN 25, otherwise BMP normal.    Liver profile normal.    Albumin 3.1.    Lipase 20.    Hemoglobin 8.9, MCV 91.2, otherwise CBC normal.    Chest x-ray showed faint  opacity in the right mid lung zone, representing pneumonia.    Repeat chest x-ray showed clearing of the faint infiltrate in the right mid lung zone, no abnormalities.  Right hip x-ray postoperatively showed internal fixation of the right proximal femoral fracture.    CT of the head:  No acute findings.    Pelvic x-ray postsurgically showed internal fixation of proximal right femoral fracture.    CT of the right hip showed acute subcapital right femoral neck fracture.    ASSESSMENT/PLAN:  Right subcapital femoral neck fracture.  Status post ORIF.  Continue rehabilitation.    Right wrist fracture.  Continue immobilization and pain control.    Renovascular hypertension.  Well controlled.     Chronic kidney disease stage II.  Reassess renal functions.   Acute blood loss anemia.  Reassess hemoglobin level.    Constipation.  Uncontrolled problem.  Increase senna to q.d.  Chronic pancreatitis.  Continue Creon.    Transitional cell carcinoma.  Status post resection and chemotherapy.    Bilateral knee osteoarthritis and effusion.  Status post aspiration and steroid injection.    Check CBC and BMP.   I have reviewed patient's medical records received at admission/from hospitalization.  CPT CODE: 09811

## 2012-10-19 ENCOUNTER — Non-Acute Institutional Stay (SKILLED_NURSING_FACILITY): Payer: Medicare Other | Admitting: Internal Medicine

## 2012-10-19 DIAGNOSIS — I150 Renovascular hypertension: Secondary | ICD-10-CM

## 2012-10-19 DIAGNOSIS — E785 Hyperlipidemia, unspecified: Secondary | ICD-10-CM

## 2012-10-19 DIAGNOSIS — D630 Anemia in neoplastic disease: Secondary | ICD-10-CM

## 2012-10-19 DIAGNOSIS — F411 Generalized anxiety disorder: Secondary | ICD-10-CM

## 2012-10-21 DIAGNOSIS — D630 Anemia in neoplastic disease: Secondary | ICD-10-CM | POA: Insufficient documentation

## 2012-10-21 DIAGNOSIS — F411 Generalized anxiety disorder: Secondary | ICD-10-CM | POA: Insufficient documentation

## 2012-10-21 NOTE — Progress Notes (Signed)
PROGRESS NOTE  DATE: 10/19/2012  FACILITY: Nursing Home Location: [facility]  LEVEL OF CARE: SNF (31)  Routine Visit  CHIEF COMPLAINT:  Manage hypertension hyperlipidemia and anxiety  HISTORY OF PRESENT ILLNESS:  REASSESSMENT OF ONGOING PROBLEM(S):  HYPERLIPIDEMIA: No complications from the medications presently being used. Last fasting lipid panel showed : HDL 22 otherwise fasting lipid panel normal in 5/14.  HTN: Pt 's HTN remains stable.  Denies CP, sob, DOE, pedal edema, headaches, dizziness or visual disturbances.  No complications from the medications currently being used.  Last BP : 106/60.  ANXIETY: The anxiety is unstable. Patient c/o ongoing anxiety or irritability. No complications reported from the medications currently being used.  PAST MEDICAL HISTORY : Reviewed.  No changes.  CURRENT MEDICATIONS: Reviewed per [facility]  REVIEW OF SYSTEMS:  GENERAL: no change in appetite, no fatigue, no weight changes, no fever, chills or weakness RESPIRATORY: no cough, SOB, DOE, wheezing, hemoptysis CARDIAC: no chest pain, edema or palpitations GI: no abdominal pain, diarrhea, constipation, heart burn, nausea or vomiting  PHYSICAL EXAMINATION  VS:  T       P      RR      BP     POX %     WT (Lb)  GENERAL: no acute distress, normal body habitus EYES: conjunctivae normal, sclerae normal, normal eye lids NECK: supple, trachea midline, no neck masses, no thyroid tenderness, no thyromegaly LYMPHATICS: no LAN in the neck, no supraclavicular LAN RESPIRATORY: breathing is even & unlabored, BS CTAB CARDIAC: RRR, no murmur,no extra heart sounds, +1 bilateral lower extremity edema GI: abdomen soft, normal BS, no masses, no tenderness, no hepatomegaly, no splenomegaly PSYCHIATRIC: the patient is alert & oriented to person, affect & behavior appropriate  LABS/RADIOLOGY:  6/14 WBC 12, hemoglobin 9.5, MCV 89.2, platelets 527, glucose 132, creatinine 1.24 otherwise  BMP normal  ASSESSMENT/PLAN:  Hyperlipidemia-stable. Hypertension-well-controlled. Anxiety-unstable problem. Increase Ativan 2.5 mg 3 times a day when necessary. Anemia of neoplasm-stable. Chronic pancreatitis-denies pain. Right hip fracture-status post surgery. Continue rehabilitation. Right wrist fracture-continue cast. Check liver profile. History of bladder cancer-followed by the urologist.  CPT CODE: 65784

## 2012-10-22 ENCOUNTER — Other Ambulatory Visit: Payer: Self-pay | Admitting: *Deleted

## 2012-10-22 MED ORDER — LORAZEPAM 0.5 MG PO TABS: ORAL_TABLET | ORAL | Status: DC

## 2012-10-25 ENCOUNTER — Telehealth: Payer: Self-pay | Admitting: Oncology

## 2012-10-30 ENCOUNTER — Other Ambulatory Visit: Payer: Self-pay | Admitting: Geriatric Medicine

## 2012-10-30 MED ORDER — OXYCODONE HCL ER 30 MG PO T12A: 30.0000 mg | EXTENDED_RELEASE_TABLET | Freq: Two times a day (BID) | ORAL | Status: DC

## 2012-10-31 ENCOUNTER — Other Ambulatory Visit: Payer: Self-pay | Admitting: Oncology

## 2012-11-01 ENCOUNTER — Ambulatory Visit (HOSPITAL_BASED_OUTPATIENT_CLINIC_OR_DEPARTMENT_OTHER): Payer: Medicare Other | Admitting: Oncology

## 2012-11-01 ENCOUNTER — Telehealth: Payer: Self-pay | Admitting: Oncology

## 2012-11-01 ENCOUNTER — Other Ambulatory Visit: Payer: Medicare Other | Admitting: Lab

## 2012-11-01 VITALS — BP 95/72 | HR 63 | Temp 98.0°F | Resp 20 | Ht 72.05 in

## 2012-11-01 DIAGNOSIS — C679 Malignant neoplasm of bladder, unspecified: Secondary | ICD-10-CM

## 2012-11-01 NOTE — Telephone Encounter (Signed)
Gave pt appt for lab and MD for 8/19, printed AVS

## 2012-11-01 NOTE — Progress Notes (Signed)
Hematology and Oncology Follow Up Visit  [name] [id number] 08-20-1940 72 y.o. 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  Prior Therapy: Flexible cystoscopy in July 2013.  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 Pathology showed malignant cells consistent with transitional cell carcinoma (case number [id number]). Patient is S/P  systemic chemotherapy with cisplatin and Gemzar on 12/16/2011. Cisplatin is given on day 1 and Gemzar is given on day 1 and day 8 of a 21 day cycle. He missed cycle 1, day 8 due to poor tolerance. Cycle 2 day 1 was dose reduced by 50%. Therapy completed on 02/24/2012.  He is S/P Right robotic-assisted laparoscopic nephroureterectomy and retroperitoneal lymph node dissection done on 12/16. His Pathology showed T2N0 (0/7 lymph nodes involved).   Current therapy: Observation and follow up.   Interim History:  [name] is a 72 year old gentleman seen for routine followup today. Since his last visit, he sustained a fall and suffered a broken hip and wrist. He required an operation of hip pinning on 5/18 2014. He did very well with his recent operation. Fatigue is better at this time, he gained weight since his last visit. No nausea or vomiting. Denies fevers, chills, night sweats, chest pain, shortness of breath, abdominal pain. He still in rehab and not yet ambulatory.   Medications: I have reviewed the patient's current medications. Current Outpatient Prescriptions  Medication Sig Dispense Refill  . amLODipine (NORVASC) 5 MG tablet Take 5 mg by mouth every morning.      Marland Kitchen aspirin 81 MG tablet Take 81 mg by mouth every morning.       . docusate sodium (COLACE) 100 MG capsule Take 100 mg by mouth every evening.      . enoxaparin (LOVENOX) 40  MG/0.4ML injection Inject 0.4 mLs (40 mg total) into the skin daily.  21 Syringe  0  . lipase/protease/amylase (CREON) 12000 UNITS CPEP Take 2 capsules by mouth 3 (three) times daily before meals.  540 capsule  3  . lisinopril (PRINIVIL,ZESTRIL) 20 MG tablet Take 20 mg by mouth every evening.       Marland Kitchen LORazepam (ATIVAN) 0.5 MG tablet Take one tablet by mouth three times daily as needed for anxiety  90 tablet  5  . LORazepam (ATIVAN) 1 MG tablet Take 1.5 tablets (1.5 mg total) by mouth every 8 (eight) hours as needed for anxiety.  30 tablet  0  . oxyCODONE 20 MG TABS Take 1 tablet (20 mg total) by mouth every 4 (four) hours as needed.  30 tablet  0  . OxyCODONE 60 MG T12A Take 60 mg by mouth every 12 (twelve) hours.  60 tablet  0  . OxyCODONE HCl ER (OXYCONTIN) 30 MG T12A Take 30 mg by mouth every 12 (twelve) hours.  60 each  0  . prochlorperazine (COMPAZINE) 10 MG tablet Take 10 mg by mouth every 6 (six) hours as needed (for nausea).       . rosuvastatin (CRESTOR) 10 MG tablet Take 10 mg by mouth every evening.      . senna (SENOKOT) 8.6 MG TABS Take 1 tablet by mouth every other day.       No current facility-administered medications for this visit.    Allergies: No Known Allergies  Past Medical History, Surgical history, Social history, and  Family History were reviewed and updated.  Review of Systems: Constitutional:  Negative for fever, chills, night sweats, anorexia, weight loss, pain. Cardiovascular: no chest pain or dyspnea on exertion Respiratory: no shortness of breath, or wheezing Neurological: no TIA or stroke symptoms Dermatological: negative ENT: negative Skin: Negative. Gastrointestinal: no abdominal pain, change in bowel habits, or black or bloody stools positive for - appetite loss, constipation and nausea/vomiting Genito-Urinary: no dysuria, trouble voiding, or hematuria Hematological and Lymphatic: negative Breast: negative for breast lumps Musculoskeletal:  negative Remaining ROS negative.  Physical Exam: Blood pressure 95/72, pulse 63, temperature 98 F (36.7 C), temperature source Oral, resp. rate 20, height 6' 0.05" (1.83 m). ECOG: 1 General appearance: alert, cooperative and no distress Head: Normocephalic, without obvious abnormality, atraumatic. Perioral swelling noted since last visit.  Neck: no adenopathy, no carotid bruit, no JVD, supple, symmetrical, trachea midline and thyroid not enlarged, symmetric, no tenderness/mass/nodules Lymph nodes: Cervical, supraclavicular, and axillary nodes normal. Heart:regular rate and rhythm, S1, S2 normal, no murmur, click, rub or gallop Lung:chest clear, no wheezing, rales, normal symmetric air entry, no tachypnea, retractions or cyanosis Abdomen: soft, non-tender, without masses or organomegaly EXT:no erythema, induration, or nodules   Lab Results: Lab Results  Component Value Date   WBC 11.4* 10/10/2012   HGB 9.7* 10/10/2012   HCT 29.3* 10/10/2012   MCV 89.6 10/10/2012   PLT 397 10/10/2012     Chemistry      Component Value Date/Time   NA 137 10/10/2012 0927   NA 134* 09/20/2012 0537   K 3.9 10/10/2012 0927   K 4.5 09/20/2012 0537   CL 100 10/10/2012 0927   CL 99 09/20/2012 0537   CO2 25 10/10/2012 0927   CO2 25 09/20/2012 0537   BUN 14.6 10/10/2012 0927   BUN 25* 09/20/2012 0537   CREATININE 1.6* 10/10/2012 0927   CREATININE 1.32 09/20/2012 0537      Component Value Date/Time   CALCIUM 10.0 10/10/2012 0927   CALCIUM 9.3 09/20/2012 0537   ALKPHOS 74 10/10/2012 0927   ALKPHOS 49 09/16/2012 0515   AST 19 10/10/2012 0927   AST 28 09/16/2012 0515   ALT 15 10/10/2012 0927   ALT 14 09/16/2012 0515   BILITOT 0.34 10/10/2012 0927   BILITOT 0.2* 09/16/2012 0515     CT ABDOMEN AND PELVIS WITH CONTRAST  Technique: Multidetector CT imaging of the abdomen and pelvis was  performed following the standard protocol during bolus  administration of intravenous contrast.  Contrast: OMNIPAQUE IOHEXOL 300  MG/ML SOLN  Comparison: 07/13/2012  Findings: Lung bases: Clear lung bases. Mild cardiomegaly with  coronary artery atherosclerosis. Ascending aortic aneurysm which  is incompletely imaged. No pericardial or pleural effusion. Para  esophageal node measures 1.2 cm on image 12/series 2 versus 6 mm on  the prior. Development of retrocrural adenopathy, with an index  node measuring 1.5 cm on image 15/series 2.  Abdomen/pelvis: Possible too small to characterize right liver  lobe lesion on image 12/series 2. Vague hypoattenuation in the  right lobe liver anteriorly on image 24/series 2 at 1.0 cm. This  may have been present on the prior. There is also subtle area of  hypoattenuation more posteriorly in the right lobe of the liver  which measures 1.1 cm. Image 25/series 2. Not readily identified  on the prior.  Possible too small to characterize hepatic dome lesion on image  12/series 2.  Redemonstration of pneumobilia within the left lobe.  Normal spleen, stomach.  Moderate  pancreatic atrophy. Similar mild ductal dilatation within  the head without definite obstructive stone or mass.  Cholecystectomy without biliary ductal dilatation.  Similar bilateral adrenal nodularity. Stable appearance of the  left kidney, with too small to characterize lesions and a lower  pole sinus cyst.  Status post right nephrectomy. Slight increase in fluid within the  nephrectomy bed on image 19/series 2. No abnormal enhancement or  solid component.  The IVC is displaced by the below described retroperitoneal fluid  collection. Appears patent both proximally and distally.  Progressive retroperitoneal adenopathy. Index left periaortic  nodes measure up to 2.6 cm on image 36/series 2 versus 1.3 cm on  the prior.  A preaortic node measures 1.8 cm on image 32/series 2 and is new.  Low density lesion along the anterior aspect of the right psoas  muscle measures 3.2 x 3.3 cm on image 37/series 2 versus 3.5 x 3.3   cm on the prior exam.  Colonic stool burden suggests constipation.  Infrarenal abdominal aortic aneurysm of maximally 5.0 cm. No  surrounding hemorrhage.No pelvic adenopathy. Anterior bladder wall  thickening on image 77 could be partially due to underdistension.  Normal prostate, without significant free pelvic fluid. Similar  nonspecific presacral fascial thickening.  Bones/Musculoskeletal: Right proximal femoral fixation. Moderate  osteopenia.  IMPRESSION:  1. Moderate progression of nodal metastasis within the upper  abdomen and extension into the nodal stations of the lower chest.  2. Subtle low density foci within the liver. Some of these may be  new since the prior exam. Cannot exclude early hepatic metastasis.  Clinical strategies could include follow-up CT versus further  characterization with pre and post contrast MRI. PET may also be  informative.  3. Similar infrarenal abdominal aortic aneurysm.  4. Postoperative seroma versus less likely necrotic nodal  metastasis within the retroperitoneum, slightly decreased in size.  5. Status post right nephrectomy with slight increase in  nonspecific fluid in the nephrectomy bed.  6. Nonspecific anterior bladder wall thickening. This may be  accentuated by underdistension in this area.  7. Similar bilateral adrenal nodularity.  8. Possible constipation.    Impression and Plan: This is a 72 year old gentleman with the following issues:  1. 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 The patient is S/P systemic chemotherapy with cisplatin and Gemzar. Chemotherapy dose reduced by 50%. He is S/P surgical resection with T2N0 disease.  CT scan results from 10/10/2012 discussed today and did show enlarged pelvic lymph nodes suspicious for cancer which has slightly enlarged since the last visit. Ideally, he needs to restart chemotherapy. But given his recent injury, he would like to postpone that till he is recovered at this  time. I will evaluate him in 4-5 weeks and we will readdress that with him    2. Anemia. Due to his chemotherapy and recent operation, improving.   3. Nausea. Resolved now  4. Follow up: in 11/2012.    Makhiya Coburn 7/3/20148:35 AM

## 2012-11-12 ENCOUNTER — Non-Acute Institutional Stay (SKILLED_NURSING_FACILITY): Payer: Medicare Other | Admitting: Adult Health

## 2012-11-12 DIAGNOSIS — F32A Depression, unspecified: Secondary | ICD-10-CM

## 2012-11-12 DIAGNOSIS — F329 Major depressive disorder, single episode, unspecified: Secondary | ICD-10-CM

## 2012-11-12 DIAGNOSIS — D649 Anemia, unspecified: Secondary | ICD-10-CM

## 2012-11-15 ENCOUNTER — Non-Acute Institutional Stay (SKILLED_NURSING_FACILITY): Payer: Medicare Other | Admitting: Internal Medicine

## 2012-11-15 DIAGNOSIS — F411 Generalized anxiety disorder: Secondary | ICD-10-CM

## 2012-11-15 DIAGNOSIS — D630 Anemia in neoplastic disease: Secondary | ICD-10-CM

## 2012-11-15 DIAGNOSIS — E785 Hyperlipidemia, unspecified: Secondary | ICD-10-CM

## 2012-11-15 DIAGNOSIS — I10 Essential (primary) hypertension: Secondary | ICD-10-CM

## 2012-11-19 NOTE — Progress Notes (Signed)
PROGRESS NOTE  DATE: 11-15-12  FACILITY: Nursing Home Location: [facility]  LEVEL OF CARE: SNF (31)  Routine Visit  CHIEF COMPLAINT:  Manage hypertension hyperlipidemia and anxiety  HISTORY OF PRESENT ILLNESS:  REASSESSMENT OF ONGOING PROBLEM(S):  HYPERLIPIDEMIA: No complications from the medications presently being used. Last fasting lipid panel showed : HDL 22 otherwise fasting lipid panel normal in 5/14.  HTN: Pt 's HTN remains stable.  Denies CP, sob, DOE, pedal edema, headaches, dizziness or visual disturbances.  No complications from the medications currently being used.  Last BP : 106/60, 114/89  ANXIETY: The anxiety is stable. Patient denies ongoing anxiety or irritability. No complications reported from the medications currently being used.  PAST MEDICAL HISTORY : Reviewed.  No changes.  CURRENT MEDICATIONS: Reviewed per [facility]  REVIEW OF SYSTEMS:  GENERAL: no change in appetite, no fatigue, no weight changes, no fever, chills or weakness RESPIRATORY: no cough, SOB, DOE, wheezing, hemoptysis CARDIAC: no chest pain, edema or palpitations GI: no abdominal pain, diarrhea, constipation, heart burn, nausea or vomiting  PHYSICAL EXAMINATION  VS:  T 98       P 77     RR 18      BP     POX % 96     WT (Lb)  GENERAL: no acute distress, normal body habitus NECK: supple, trachea midline, no neck masses, no thyroid tenderness, no thyromegaly RESPIRATORY: breathing is even & unlabored, BS CTAB CARDIAC: RRR, no murmur,no extra heart sounds, +1 bilateral lower extremity edema GI: abdomen soft, normal BS, no masses, no tenderness, no hepatomegaly, no splenomegaly PSYCHIATRIC: the patient is alert & oriented to person, affect & behavior appropriate  LABS/RADIOLOGY:  6/14 WBC 12, hemoglobin 9.5, MCV 89.2, platelets 527, glucose 132, creatinine 1.24 otherwise BMP  normal  ASSESSMENT/PLAN:  Hyperlipidemia-stable. Hypertension-well-controlled. Anxiety-stable. Anemia of neoplasm-stable. Iron started. Chronic pancreatitis-denies pain. Right hip fracture-status post surgery. Continue rehabilitation. Right wrist fracture-continue cast. Check liver profile. History of bladder cancer-followed by the urologist.  CPT CODE: 16109

## 2012-11-27 ENCOUNTER — Telehealth: Payer: Self-pay | Admitting: Internal Medicine

## 2012-11-27 NOTE — Telephone Encounter (Signed)
We can try to work him in-- we'll talk tomorrow

## 2012-11-27 NOTE — Telephone Encounter (Signed)
PT called and stated that he is in a rehab for multiple injuries and would like to see Dr. Cato Mulligan. He stated that he would like to be seen before October. Please assist.

## 2012-11-28 ENCOUNTER — Encounter: Payer: Self-pay | Admitting: Adult Health

## 2012-11-29 ENCOUNTER — Other Ambulatory Visit: Payer: Self-pay | Admitting: Geriatric Medicine

## 2012-11-29 MED ORDER — OXYCODONE HCL ER 30 MG PO T12A: 30.0000 mg | EXTENDED_RELEASE_TABLET | Freq: Two times a day (BID) | ORAL | Status: DC

## 2012-11-29 NOTE — Telephone Encounter (Signed)
Could you tell me where you want to work him in?

## 2012-12-03 ENCOUNTER — Encounter: Payer: Self-pay | Admitting: Adult Health

## 2012-12-04 ENCOUNTER — Encounter: Payer: Self-pay | Admitting: Adult Health

## 2012-12-04 ENCOUNTER — Telehealth: Payer: Self-pay | Admitting: *Deleted

## 2012-12-04 NOTE — Telephone Encounter (Signed)
Patient set up for type and x-match and transfusion 12-07-12, 8:00 lab and 9:00 transfusion. spoke with wife, she wrote down time and date

## 2012-12-05 ENCOUNTER — Inpatient Hospital Stay (HOSPITAL_COMMUNITY)
Admission: EM | Admit: 2012-12-05 | Discharge: 2012-12-06 | DRG: 812 | Disposition: A | Discharge status: Skilled Nursing Facility | Payer: Medicare Other | Attending: Internal Medicine | Admitting: Internal Medicine

## 2012-12-05 ENCOUNTER — Emergency Department (HOSPITAL_COMMUNITY): Payer: Medicare Other

## 2012-12-05 ENCOUNTER — Encounter (HOSPITAL_COMMUNITY): Payer: Self-pay | Admitting: Emergency Medicine

## 2012-12-05 DIAGNOSIS — I739 Peripheral vascular disease, unspecified: Secondary | ICD-10-CM

## 2012-12-05 DIAGNOSIS — C772 Secondary and unspecified malignant neoplasm of intra-abdominal lymph nodes: Secondary | ICD-10-CM | POA: Diagnosis present

## 2012-12-05 DIAGNOSIS — D62 Acute posthemorrhagic anemia: Principal | ICD-10-CM | POA: Diagnosis present

## 2012-12-05 DIAGNOSIS — Z905 Acquired absence of kidney: Secondary | ICD-10-CM

## 2012-12-05 DIAGNOSIS — S72001A Fracture of unspecified part of neck of right femur, initial encounter for closed fracture: Secondary | ICD-10-CM

## 2012-12-05 DIAGNOSIS — F411 Generalized anxiety disorder: Secondary | ICD-10-CM

## 2012-12-05 DIAGNOSIS — Z66 Do not resuscitate: Secondary | ICD-10-CM | POA: Diagnosis present

## 2012-12-05 DIAGNOSIS — D72829 Elevated white blood cell count, unspecified: Secondary | ICD-10-CM

## 2012-12-05 DIAGNOSIS — W102XXA Fall (on)(from) incline, initial encounter: Secondary | ICD-10-CM

## 2012-12-05 DIAGNOSIS — M1710 Unilateral primary osteoarthritis, unspecified knee: Secondary | ICD-10-CM | POA: Diagnosis present

## 2012-12-05 DIAGNOSIS — Z860100 Personal history of colon polyps, unspecified: Secondary | ICD-10-CM

## 2012-12-05 DIAGNOSIS — I714 Abdominal aortic aneurysm, without rupture, unspecified: Secondary | ICD-10-CM

## 2012-12-05 DIAGNOSIS — D649 Anemia, unspecified: Secondary | ICD-10-CM

## 2012-12-05 DIAGNOSIS — I10 Essential (primary) hypertension: Secondary | ICD-10-CM

## 2012-12-05 DIAGNOSIS — C659 Malignant neoplasm of unspecified renal pelvis: Secondary | ICD-10-CM | POA: Diagnosis present

## 2012-12-05 DIAGNOSIS — J189 Pneumonia, unspecified organism: Secondary | ICD-10-CM

## 2012-12-05 DIAGNOSIS — C787 Secondary malignant neoplasm of liver and intrahepatic bile duct: Secondary | ICD-10-CM | POA: Diagnosis present

## 2012-12-05 DIAGNOSIS — S72009D Fracture of unspecified part of neck of unspecified femur, subsequent encounter for closed fracture with routine healing: Secondary | ICD-10-CM

## 2012-12-05 DIAGNOSIS — F1011 Alcohol abuse, in remission: Secondary | ICD-10-CM | POA: Diagnosis present

## 2012-12-05 DIAGNOSIS — I129 Hypertensive chronic kidney disease with stage 1 through stage 4 chronic kidney disease, or unspecified chronic kidney disease: Secondary | ICD-10-CM | POA: Diagnosis present

## 2012-12-05 DIAGNOSIS — C679 Malignant neoplasm of bladder, unspecified: Secondary | ICD-10-CM | POA: Diagnosis present

## 2012-12-05 DIAGNOSIS — C7910 Secondary malignant neoplasm of unspecified urinary organs: Secondary | ICD-10-CM

## 2012-12-05 DIAGNOSIS — K859 Acute pancreatitis without necrosis or infection, unspecified: Secondary | ICD-10-CM

## 2012-12-05 DIAGNOSIS — N179 Acute kidney failure, unspecified: Secondary | ICD-10-CM | POA: Diagnosis present

## 2012-12-05 DIAGNOSIS — E785 Hyperlipidemia, unspecified: Secondary | ICD-10-CM

## 2012-12-05 DIAGNOSIS — N182 Chronic kidney disease, stage 2 (mild): Secondary | ICD-10-CM | POA: Diagnosis present

## 2012-12-05 DIAGNOSIS — K5730 Diverticulosis of large intestine without perforation or abscess without bleeding: Secondary | ICD-10-CM

## 2012-12-05 DIAGNOSIS — I150 Renovascular hypertension: Secondary | ICD-10-CM

## 2012-12-05 DIAGNOSIS — D630 Anemia in neoplastic disease: Secondary | ICD-10-CM | POA: Diagnosis present

## 2012-12-05 LAB — CBC WITH DIFFERENTIAL/PLATELET
Basophils Relative: 0 % (ref 0–1)
Eosinophils Absolute: 0 10*3/uL (ref 0.0–0.7)
HCT: 23.5 % — ABNORMAL LOW (ref 39.0–52.0)
Hemoglobin: 7.1 g/dL — ABNORMAL LOW (ref 13.0–17.0)
MCH: 24.9 pg — ABNORMAL LOW (ref 26.0–34.0)
MCHC: 30.2 g/dL (ref 30.0–36.0)
Monocytes Absolute: 0.6 10*3/uL (ref 0.1–1.0)
Monocytes Relative: 5 % (ref 3–12)

## 2012-12-05 LAB — COMPREHENSIVE METABOLIC PANEL
Albumin: 2.5 g/dL — ABNORMAL LOW (ref 3.5–5.2)
BUN: 39 mg/dL — ABNORMAL HIGH (ref 6–23)
Creatinine, Ser: 1.52 mg/dL — ABNORMAL HIGH (ref 0.50–1.35)
Total Bilirubin: 0.4 mg/dL (ref 0.3–1.2)
Total Protein: 7.2 g/dL (ref 6.0–8.3)

## 2012-12-05 LAB — IRON AND TIBC: UIBC: 145 ug/dL (ref 125–400)

## 2012-12-05 LAB — RETICULOCYTES
RBC.: 2.81 MIL/uL — ABNORMAL LOW (ref 4.22–5.81)
Retic Count, Absolute: 33.7 10*3/uL (ref 19.0–186.0)
Retic Ct Pct: 1.2 % (ref 0.4–3.1)

## 2012-12-05 LAB — PROTIME-INR
INR: 1.39 (ref 0.00–1.49)
Prothrombin Time: 16.7 seconds — ABNORMAL HIGH (ref 11.6–15.2)

## 2012-12-05 LAB — OCCULT BLOOD, POC DEVICE: Fecal Occult Bld: NEGATIVE

## 2012-12-05 MED ORDER — SENNOSIDES-DOCUSATE SODIUM 8.6-50 MG PO TABS
2.0000 | ORAL_TABLET | Freq: Two times a day (BID) | ORAL | Status: DC
  Administered 2012-12-05 – 2012-12-06 (×2): 2 via ORAL
  Filled 2012-12-05 (×3): qty 2

## 2012-12-05 MED ORDER — ESCITALOPRAM OXALATE 20 MG PO TABS
20.0000 mg | ORAL_TABLET | Freq: Every morning | ORAL | Status: DC
  Administered 2012-12-06: 20 mg via ORAL
  Filled 2012-12-05: qty 1

## 2012-12-05 MED ORDER — ONDANSETRON HCL 4 MG/2ML IJ SOLN
4.0000 mg | Freq: Four times a day (QID) | INTRAMUSCULAR | Status: DC | PRN
  Administered 2012-12-05: 4 mg via INTRAVENOUS
  Filled 2012-12-05: qty 2

## 2012-12-05 MED ORDER — IOHEXOL 300 MG/ML  SOLN
50.0000 mL | Freq: Once | INTRAMUSCULAR | Status: AC | PRN
  Administered 2012-12-05: 50 mL via ORAL

## 2012-12-05 MED ORDER — BISACODYL 10 MG RE SUPP: 10.0000 mg | Freq: Every day | RECTAL | Status: DC | PRN

## 2012-12-05 MED ORDER — FENTANYL CITRATE 0.05 MG/ML IJ SOLN
25.0000 ug | Freq: Once | INTRAMUSCULAR | Status: AC
  Administered 2012-12-05: 25 ug via INTRAVENOUS
  Filled 2012-12-05: qty 2

## 2012-12-05 MED ORDER — OXYCODONE HCL 5 MG PO TABS
5.0000 mg | ORAL_TABLET | ORAL | Status: DC | PRN
  Administered 2012-12-05 – 2012-12-06 (×4): 5 mg via ORAL
  Filled 2012-12-05 (×4): qty 1

## 2012-12-05 MED ORDER — OXYCODONE HCL ER 30 MG PO T12A: 30.0000 mg | EXTENDED_RELEASE_TABLET | Freq: Two times a day (BID) | ORAL | Status: DC

## 2012-12-05 MED ORDER — PANCRELIPASE (LIP-PROT-AMYL) 12000-38000 UNITS PO CPEP
2.0000 | ORAL_CAPSULE | Freq: Three times a day (TID) | ORAL | Status: DC
  Administered 2012-12-06 (×2): 2 via ORAL
  Filled 2012-12-05 (×4): qty 2

## 2012-12-05 MED ORDER — DOCUSATE SODIUM 100 MG PO CAPS
100.0000 mg | ORAL_CAPSULE | Freq: Every evening | ORAL | Status: DC
  Administered 2012-12-05: 100 mg via ORAL
  Filled 2012-12-05 (×2): qty 1

## 2012-12-05 MED ORDER — ENOXAPARIN SODIUM 40 MG/0.4ML ~~LOC~~ SOLN
40.0000 mg | SUBCUTANEOUS | Status: DC
  Administered 2012-12-06: 40 mg via SUBCUTANEOUS
  Filled 2012-12-05: qty 0.4

## 2012-12-05 MED ORDER — ENOXAPARIN SODIUM 40 MG/0.4ML ~~LOC~~ SOLN
40.0000 mg | SUBCUTANEOUS | Status: DC
  Filled 2012-12-05: qty 0.4

## 2012-12-05 MED ORDER — OXYCODONE HCL ER 15 MG PO T12A
30.0000 mg | EXTENDED_RELEASE_TABLET | Freq: Two times a day (BID) | ORAL | Status: DC
  Administered 2012-12-05 – 2012-12-06 (×2): 30 mg via ORAL
  Filled 2012-12-05 (×2): qty 2

## 2012-12-05 MED ORDER — LACTULOSE 20 GM/30ML PO SOLN: 30.0000 mL | Freq: Two times a day (BID) | ORAL | Status: DC

## 2012-12-05 MED ORDER — SENNA-DOCUSATE SODIUM 8.6-50 MG PO TABS: 2.0000 | ORAL_TABLET | Freq: Two times a day (BID) | ORAL | Status: DC

## 2012-12-05 MED ORDER — AMLODIPINE BESYLATE 5 MG PO TABS
5.0000 mg | ORAL_TABLET | Freq: Every morning | ORAL | Status: DC
  Administered 2012-12-06: 5 mg via ORAL
  Filled 2012-12-05: qty 1

## 2012-12-05 MED ORDER — LACTULOSE 10 GM/15ML PO SOLN
20.0000 g | Freq: Two times a day (BID) | ORAL | Status: DC
  Administered 2012-12-05: 20 g via ORAL
  Filled 2012-12-05 (×3): qty 30

## 2012-12-05 MED ORDER — ACETAMINOPHEN 325 MG PO TABS: 650.0000 mg | ORAL_TABLET | Freq: Four times a day (QID) | ORAL | Status: DC | PRN

## 2012-12-05 MED ORDER — ATORVASTATIN CALCIUM 20 MG PO TABS
20.0000 mg | ORAL_TABLET | Freq: Every evening | ORAL | Status: DC
  Administered 2012-12-05: 20 mg via ORAL
  Filled 2012-12-05 (×2): qty 1

## 2012-12-05 MED ORDER — LORAZEPAM 0.5 MG PO TABS
0.5000 mg | ORAL_TABLET | Freq: Three times a day (TID) | ORAL | Status: DC | PRN
  Administered 2012-12-06 (×3): 0.5 mg via ORAL
  Filled 2012-12-05 (×3): qty 1

## 2012-12-05 MED ORDER — SODIUM CHLORIDE 0.9 % IV BOLUS (SEPSIS)
500.0000 mL | Freq: Once | INTRAVENOUS | Status: AC
  Administered 2012-12-05: 1000 mL via INTRAVENOUS

## 2012-12-05 NOTE — ED Notes (Signed)
States abdominal pain is 2/10 now.  Continues to drink contrast with encouragement.

## 2012-12-05 NOTE — ED Notes (Signed)
Patient states pain is 7/10.  Currently drinking contrast for CT scan.

## 2012-12-05 NOTE — ED Notes (Signed)
I just gave report to Irving Burton, [place].  He remains in no distress.

## 2012-12-05 NOTE — H&P (Signed)
[facility] History and Physical  [name] YQM:[id number] DOB: June 15, 1940 DOA: 12/05/2012  Referring physician: EDP PCP: [name], MD  Specialists: Onc Dr.Shadad  Chief Complaint: low blood count  HPI: [name] is a 72 y.o. 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 His exwife who is his HCPoA provides most of the history, she reports progressive decline over the last 1 and 1/2 month in terms of energy, appetite, activity etc. He ambulates using a wheel chair now.  His blood counts have been trending down for past month and on evaluation today it was 6.9 and hence sent to the ER. Pt denies any hemetemesis or melena, he reports occasional dark stools when he takes Iron. Ho h/o hemturia Upon evaluation in ER noted to have Hb of 7.1, hemoccult negative, CT abd ordered per EDP and pending    Review of Systems: The patient denies anorexia, fever,  vision loss, decreased hearing, hoarseness, chest pain, syncope, dyspnea on exertion, peripheral edema, balance deficits, hemoptysis, abdominal pain, melena, hematochezia, severe indigestion/heartburn, hematuria, incontinence, genital sores, muscle weakness, suspicious skin lesions, transient blindness, difficulty walking, depression, unusual weight change, abnormal bleeding, enlarged lymph nodes, angioedema, and breast masses.    Past Medical History  Diagnosis Date  . Hx of colonic polyps   . Diverticulosis   . Hyperlipidemia   . AAA (abdominal aortic aneurysm) last ct 03-02-11    4.6cm  per note from dr fields w/ chart  . Substance abuse hx alcohol abuse  . Alcohol-induced chronic pancreatitis   . Hypertension stress test 1996- states wnl  . Peripheral vascular disease     occlusion left external iliac artery   . Gross hematuria     freq/ urge/ nocturia  .  Arthritis     knees  . Peripheral arterial disease   . Transitional cell carcinoma     kidney, ureter, bladder stage 4   Past Surgical History  Procedure Laterality Date  . Knee surgery  1961    left  . Wrist fracture surgery  2010    orif left wrist  . Cataract extraction w/ intraocular lens  implant, bilateral  1998  . Hernia repair  2010    Left inguinal herniorraphy with mesh  . Cystoscopy w/ retrogrades  03/14/2011    Procedure: CYSTOSCOPY WITH RETROGRADE PYELOGRAM;  Surgeon: Valetta Fuller, MD;  Location: [facility];  Service: Urology;  Laterality: Bilateral;  . Cystoscopy with biopsy  03/14/2011    Procedure: CYSTOSCOPY WITH BIOPSY;  Surgeon: Valetta Fuller, MD;  Location: [facility];  Service: Urology;  Laterality: N/A;  c arm   . Cystoscopy w/ retrogrades  11/07/2011    Procedure: CYSTOSCOPY WITH RETROGRADE PYELOGRAM;  Surgeon: Valetta Fuller, MD;  Location: [facility];  Service: Urology;  Laterality: Bilateral;  CYSTOSCOPY, (B) RETROGRADE PYELOGRAM, RIGHT FLEXIBLE URETEROSCOPY, RIGHT JJ STENT    . Cystoscopy w/ ureteral stent placement  11/07/2011    Procedure: CYSTOSCOPY WITH STENT REPLACEMENT;  Surgeon: Valetta Fuller, MD;  Location: [facility];  Service: Urology;  Laterality: Right;  . Cholecystectomy   July 1997- laparoscopic    complications of infection, w/ additional surg's 4 minor and 3 major  . Robot assited laparoscopic nephroureterectomy  04/16/2012    Procedure: ROBOT ASSITED LAPAROSCOPIC NEPHROURETERECTOMY;  Surgeon: Crecencio Mc,  MD;  Location: [facility];  Service: Urology;  Laterality: Right;  . Lymphadenectomy  04/16/2012    Procedure: LYMPHADENECTOMY;  Surgeon: Crecencio Mc, MD;  Location: [facility];  Service: Urology;  Laterality: Right;  RETROPERITONEAL LYMPHADENECTOMY  . Hip pinning,cannulated Right 09/16/2012    Procedure: CANNULATED HIP PINNING;  Surgeon: Cammy Copa, MD;  Location: [facility];   Service: Orthopedics;  Laterality: Right;   Social History:  reports that he quit smoking about 9 years ago. His smoking use included Cigarettes. He has a 100 pack-year smoking history. He has never used smokeless tobacco. He reports that he does not drink alcohol or use illicit drugs. Lives at SNF, dependent on most ADLs  No Known Allergies  Family History  Problem Relation Age of Onset  . Heart disease Mother 8  . Heart attack Father 33  . Heart disease Father   . Hypertension Brother     Prior to Admission medications   Medication Sig Start Date End Date Taking? Authorizing Provider  amLODipine (NORVASC) 5 MG tablet Take 5 mg by mouth every morning.   Yes Historical Provider, MD  aspirin 81 MG tablet Take 81 mg by mouth every morning.    Yes Historical Provider, MD  atorvastatin (LIPITOR) 20 MG tablet Take 20 mg by mouth every evening.   Yes Historical Provider, MD  bisacodyl (DULCOLAX) 10 MG suppository Place 10 mg rectally daily as needed for constipation.   Yes Historical Provider, MD  bisacodyl (DULCOLAX) 5 MG EC tablet Take 10 mg by mouth daily as needed for constipation.   Yes Historical Provider, MD  docusate sodium (COLACE) 100 MG capsule Take 100 mg by mouth every evening.   Yes Historical Provider, MD  enoxaparin (LOVENOX) 40 MG/0.4ML injection Inject 40 mg into the skin every morning.   Yes Historical Provider, MD  escitalopram (LEXAPRO) 20 MG tablet Take 20 mg by mouth every morning.   Yes Historical Provider, MD  ferrous sulfate 325 (65 FE) MG tablet Take 325 mg by mouth at bedtime.   Yes Historical Provider, MD  Lactulose 20 GM/30ML SOLN Take 30 mLs by mouth 2 (two) times daily.   Yes Historical Provider, MD  lipase/protease/amylase (CREON) 12000 UNITS CPEP Take 2 capsules by mouth 3 (three) times daily before meals. 06/20/12  Yes Bruce Romilda Garret, MD  lisinopril (PRINIVIL,ZESTRIL) 20 MG tablet Take 20 mg by mouth every evening.    Yes Historical Provider, MD  LORazepam  (ATIVAN) 0.5 MG tablet Take 0.5 mg by mouth 3 (three) times daily as needed for anxiety.   Yes Historical Provider, MD  oxyCODONE (OXY IR/ROXICODONE) 5 MG immediate release tablet Take 5-10 mg by mouth every 4 (four) hours as needed (He takes one tablet for mild to moderate pain and two tablets for severe pain.).   Yes Historical Provider, MD  OxyCODONE HCl ER (OXYCONTIN) 30 MG T12A Take 30 mg by mouth every 12 (twelve) hours. 11/29/12  Yes Claudie Revering, NP  prochlorperazine (COMPAZINE) 10 MG tablet Take 10 mg by mouth every 6 (six) hours as needed (for nausea).  07/11/12  Yes Historical Provider, MD  sennosides-docusate sodium (SENOKOT-S) 8.6-50 MG tablet Take 2 tablets by mouth 2 (two) times daily.   Yes Historical Provider, MD   Physical Exam: There were no vitals filed for this visit.   General: lethargic, following IV narcotics in ER, arousible, follows commands  HEENt: PERRLA, EOMI  CVS: S1S2/RRR  Lungs: CTAB  Abd: soft, mild lower quadrant tenderness, BS  present  Ext: no edema c/c  Neuro: 3-4plus strength in both lower ext, generalized weakness  Psychiatric mood and affect  Skin no rashes or skin breakdown  Labs on Admission:  Basic Metabolic Panel:  Recent Labs Lab 12/05/12 1325  NA 130*  K 4.5  CL 95*  CO2 24  GLUCOSE 100*  BUN 39*  CREATININE 1.52*  CALCIUM 9.2   Liver Function Tests:  Recent Labs Lab 12/05/12 1325  AST 26  ALT 13  ALKPHOS 142*  BILITOT 0.4  PROT 7.2  ALBUMIN 2.5*   No results found for this basename: LIPASE, AMYLASE,  in the last 168 hours No results found for this basename: AMMONIA,  in the last 168 hours CBC:  Recent Labs Lab 12/05/12 1325  WBC 11.6*  NEUTROABS 10.5*  HGB 7.1*  HCT 23.5*  MCV 82.5  PLT 405*   Cardiac Enzymes: No results found for this basename: CKTOTAL, CKMB, CKMBINDEX, TROPONINI,  in the last 168 hours  BNP (last 3 results) No results found for this basename: PROBNP,  in the last 8760  hours CBG: No results found for this basename: GLUCAP,  in the last 168 hours  Radiological Exams on Admission: No results found.   Assessment/Plan Active Problems:   Bladder cancer   Closed right hip fracture   Chronic kidney disease, stage II (mild)   Anemia in neoplastic disease   1. Anemia: progressive -suspect related to progressive cancer/chronic disease -hemoccult negative -check anemia panel -transfuse 2 units PRBC today  2. Stage 4 Transitional Cell CA of Renal Pelvis -followed by Dr.Shadad -CT today with progressive disease -overall prognosis appears poor -notify dr.Shadad in am, added as consult  3. H/o R hip fracture  -s/p closed hip pinning in 5/18 -not ambulatory at this time   4. CKD 2-3: stable  5. Thombocytosis -reactive from 2  6. HTN: -continue amlodipine, hold ACE  DVT proph: lovenox  Code Status: DNR Family Communication: d./w ex-wife and HCPoA at bedside Disposition Plan: back to SNF soon, 1-2days  Time spent: 62  Paullette Mckain [facility] Pager [phone]  If 7PM-7AM, please contact night-coverage [email or web address] Password [facility] 12/05/2012, 4:37 PM

## 2012-12-05 NOTE — ED Notes (Signed)
Patient rehab center resident.  Was found to have low hemoglobin and per EMS, he is here for blood transfusion.  Spouse states hgb was 6.7 today.  C/o nausea this am.  Denies pain.  Patient is pale in color.

## 2012-12-05 NOTE — Progress Notes (Signed)
Utilization Review completed.  Raeleigh Guinn RN CM  

## 2012-12-05 NOTE — Progress Notes (Signed)
Proxy form given to pt's ex wife. Briscoe Burns BSN, RN-BC Admissions RN  12/05/2012 4:22 PM

## 2012-12-05 NOTE — ED Notes (Signed)
[id number]<YN> Expected date:<BR> Expected time:<BR> Means of arrival:<BR> Comments:<BR> ems 71 yr M, from rehab sitter. Low hgb

## 2012-12-05 NOTE — ED Provider Notes (Signed)
CSN: [id number]     Arrival date & time 12/05/12  1236 History     First MD Initiated Contact with Patient 12/05/12 1253     Chief Complaint  Patient presents with  . Anemia   (Consider location/radiation/quality/duration/timing/severity/associated sxs/prior Treatment) HPI Comments: 72 year old male presents with a family member from the nursing home. He is in a rehabilitation facility for a broken hip. They have no cyst did he decline in his hemoglobin from approximately 9 to a partially 6 past few weeks. There's no obvious source per the family. Patient has become more lethargic per family and has become more pale. Patient has stated these had dark stools in the last couple days. He also had vomiting but has not had any hematemesis or coffee-ground emesis. He did complain of some lower abdominal pain yesterday but denies any pain at this moment.   Past Medical History  Diagnosis Date  . Hx of colonic polyps   . Diverticulosis   . Hyperlipidemia   . AAA (abdominal aortic aneurysm) last ct 03-02-11    4.6cm  per note from dr fields w/ chart  . Substance abuse hx alcohol abuse  . Alcohol-induced chronic pancreatitis   . Hypertension stress test 1996- states wnl  . Peripheral vascular disease     occlusion left external iliac artery   . Gross hematuria     freq/ urge/ nocturia  . Arthritis     knees  . Peripheral arterial disease   . Transitional cell carcinoma     kidney, ureter, bladder stage 4   Past Surgical History  Procedure Laterality Date  . Knee surgery  1961    left  . Wrist fracture surgery  2010    orif left wrist  . Cataract extraction w/ intraocular lens  implant, bilateral  1998  . Hernia repair  2010    Left inguinal herniorraphy with mesh  . Cystoscopy w/ retrogrades  03/14/2011    Procedure: CYSTOSCOPY WITH RETROGRADE PYELOGRAM;  Surgeon: Valetta Fuller, MD;  Location: [facility];  Service: Urology;  Laterality: Bilateral;  . Cystoscopy with  biopsy  03/14/2011    Procedure: CYSTOSCOPY WITH BIOPSY;  Surgeon: Valetta Fuller, MD;  Location: [facility];  Service: Urology;  Laterality: N/A;  c arm   . Cystoscopy w/ retrogrades  11/07/2011    Procedure: CYSTOSCOPY WITH RETROGRADE PYELOGRAM;  Surgeon: Valetta Fuller, MD;  Location: [facility];  Service: Urology;  Laterality: Bilateral;  CYSTOSCOPY, (B) RETROGRADE PYELOGRAM, RIGHT FLEXIBLE URETEROSCOPY, RIGHT JJ STENT    . Cystoscopy w/ ureteral stent placement  11/07/2011    Procedure: CYSTOSCOPY WITH STENT REPLACEMENT;  Surgeon: Valetta Fuller, MD;  Location: [facility];  Service: Urology;  Laterality: Right;  . Cholecystectomy   July 1997- laparoscopic    complications of infection, w/ additional surg's 4 minor and 3 major  . Robot assited laparoscopic nephroureterectomy  04/16/2012    Procedure: ROBOT ASSITED LAPAROSCOPIC NEPHROURETERECTOMY;  Surgeon: Crecencio Mc, MD;  Location: [facility];  Service: Urology;  Laterality: Right;  . Lymphadenectomy  04/16/2012    Procedure: LYMPHADENECTOMY;  Surgeon: Crecencio Mc, MD;  Location: [facility];  Service: Urology;  Laterality: Right;  RETROPERITONEAL LYMPHADENECTOMY  . Hip pinning,cannulated Right 09/16/2012    Procedure: CANNULATED HIP PINNING;  Surgeon: Cammy Copa, MD;  Location: [facility];  Service: Orthopedics;  Laterality: Right;   Family History  Problem Relation Age of Onset  . Heart disease  Mother 53  . Heart attack Father 53  . Heart disease Father   . Hypertension Brother    History  Substance Use Topics  . Smoking status: Former Smoker -- 2.00 packs/day for 50 years    Types: Cigarettes    Quit date: 03/11/2003  . Smokeless tobacco: Never Used  . Alcohol Use: No     Comment: hx alcohol abuse-- quit 1995    Review of Systems  Constitutional: Positive for fatigue. Negative for fever.  Respiratory: Negative for shortness of breath.   Cardiovascular: Negative for chest pain.   Gastrointestinal: Positive for vomiting and constipation. Negative for diarrhea.  Skin: Positive for pallor.  Neurological: Positive for weakness (generalized).  All other systems reviewed and are negative.    Allergies  Review of patient's allergies indicates no known allergies.  Home Medications   Current Outpatient Rx  Name  Route  Sig  Dispense  Refill  . amLODipine (NORVASC) 5 MG tablet   Oral   Take 5 mg by mouth every morning.         Marland Kitchen aspirin 81 MG tablet   Oral   Take 81 mg by mouth every morning.          . docusate sodium (COLACE) 100 MG capsule   Oral   Take 100 mg by mouth every evening.         . enoxaparin (LOVENOX) 40 MG/0.4ML injection   Subcutaneous   Inject 0.4 mLs (40 mg total) into the skin daily.   21 Syringe   0   . lipase/protease/amylase (CREON) 12000 UNITS CPEP   Oral   Take 2 capsules by mouth 3 (three) times daily before meals.   540 capsule   3   . lisinopril (PRINIVIL,ZESTRIL) 20 MG tablet   Oral   Take 20 mg by mouth every evening.          Marland Kitchen LORazepam (ATIVAN) 0.5 MG tablet      Take one tablet by mouth three times daily as needed for anxiety   90 tablet   5   . LORazepam (ATIVAN) 1 MG tablet   Oral   Take 1.5 tablets (1.5 mg total) by mouth every 8 (eight) hours as needed for anxiety.   30 tablet   0   . oxyCODONE 20 MG TABS   Oral   Take 1 tablet (20 mg total) by mouth every 4 (four) hours as needed.   30 tablet   0   . OxyCODONE 60 MG T12A   Oral   Take 60 mg by mouth every 12 (twelve) hours.   60 tablet   0   . OxyCODONE HCl ER (OXYCONTIN) 30 MG T12A   Oral   Take 30 mg by mouth every 12 (twelve) hours.   60 each   0   . prochlorperazine (COMPAZINE) 10 MG tablet   Oral   Take 10 mg by mouth every 6 (six) hours as needed (for nausea).          . rosuvastatin (CRESTOR) 10 MG tablet   Oral   Take 10 mg by mouth every evening.         . senna (SENOKOT) 8.6 MG TABS   Oral   Take 1 tablet by  mouth every other day.          SpO2 99% Physical Exam  Nursing note and vitals reviewed. Constitutional: He is oriented to person, place, and time. He appears well-developed and well-nourished.  HENT:  Head: Normocephalic and atraumatic.  Right Ear: External ear normal.  Left Ear: External ear normal.  Nose: Nose normal.  Eyes: Right eye exhibits no discharge. Left eye exhibits no discharge.  Neck: Neck supple.  Cardiovascular: Normal rate, regular rhythm, normal heart sounds and intact distal pulses.   Pulmonary/Chest: Effort normal.  Abdominal: Soft. There is no tenderness.  Genitourinary: Rectal exam shows no external hemorrhoid.  Neurological: He is alert and oriented to person, place, and time.  Skin: Skin is warm and dry. There is pallor.    ED Course   Procedures (including critical care time)  Labs Reviewed  CBC WITH DIFFERENTIAL - Abnormal; Notable for the following:    WBC 11.6 (*)    RBC 2.85 (*)    Hemoglobin 7.1 (*)    HCT 23.5 (*)    MCH 24.9 (*)    RDW 16.8 (*)    Platelets 405 (*)    Neutrophils Relative % 90 (*)    Neutro Abs 10.5 (*)    Lymphocytes Relative 5 (*)    Lymphs Abs 0.6 (*)    All other components within normal limits  COMPREHENSIVE METABOLIC PANEL - Abnormal; Notable for the following:    Sodium 130 (*)    Chloride 95 (*)    Glucose, Bld 100 (*)    BUN 39 (*)    Creatinine, Ser 1.52 (*)    Albumin 2.5 (*)    Alkaline Phosphatase 142 (*)    GFR calc non Af Amer 44 (*)    GFR calc Af Amer 51 (*)    All other components within normal limits  PROTIME-INR - Abnormal; Notable for the following:    Prothrombin Time 16.7 (*)    All other components within normal limits  OCCULT BLOOD, POC DEVICE  CG4 I-STAT (LACTIC ACID)  TYPE AND SCREEN   Ct Abdomen Pelvis Wo Contrast  12/05/2012   *RADIOLOGY REPORT*  Clinical Data: Low hemoglobin, anemia, metastatic bladder cancer, status post right nephrectomy.  CT ABDOMEN AND PELVIS WITHOUT  CONTRAST  Technique:  Multidetector CT imaging of the abdomen and pelvis was performed following the standard protocol without intravenous contrast.  Comparison: 10/10/2012  Findings: Lung bases shows bilateral basilar posterior small atelectasis. Again noted retrocrural adenopathy with lymph node measuring 2.2 cm with progression in size from prior exam when measures 1.6 cm.  Study is limited without IV contrast.  Again noted pneumobilia especially in the left hepatic lobe.  The study is limited without IV contrast.  There are innumerable low density lesions throughout the liver the largest in the right hepatic lobe inferiorly measures at least 7.3 cm.  This are consistent with progression of hepatic metastatic disease.  The patient is status post cholecystectomy.  Pancreatic atrophy again noted.  Stable low density lesion in the right psoas muscle measures 3.5 x 3.3 cm.  Moderate colonic stool is noted.  There is again noted mixed density fluid in the previous right nephrectomy bed slight increase from prior exam.  Measures about 3.4 x 3.5 cm.  Extensive atherosclerotic calcifications of the abdominal aorta and the iliac arteries again noted.  Again noted infrarenal abdominal aortic aneurysm measures 5 cm in diameter without significant change from prior exam.  Again noted multiple retroperitoneal periaortic nodal masses.  The largest in axial image 45 just right anterolateral to aorta measures 4 x 1.8 cm with progression from prior exam.  A  left periaortic nodal mass measures 2.6 x 2.5 cm with slight  progression from prior exam.  Right anterior periaortic lesion measures 2.5 x 2.7 cm with slight progression from prior exam.  Progression of the elongated mesenteric lymph node axial image 42 measures 4.6 x 2 cm.  Bilateral adrenal nodules are stable.  Large central cyst in lower pole of the left kidney is stable.  Mild left perinephric stranding.  Mild distention of the left renal collecting system without frank  hydronephrosis.  No hydroureter.  There is a mesenteric lymph node in the anterior lower mesentery axial image 55 measures 2.1 cm highly suspicious for progression of metastatic disease.  Again noted thickening of the urinary bladder wall.  Oral contrast material was given to the patient.  No small bowel obstruction.  Postsurgical changes are noted right proximal femur.  No destructive bony lesions are identified.  IMPRESSION:  1.  There is progression of the retroperitoneal and mesenteric nodular metastatic disease. 2.  Slight increase in mixed collection in the right nephrectomy bed. 3.  Significant progression of multiple low density lesions within the liver consistent with progression of metastatic disease. 4.  Stable low density lesion within the right psoas muscle. 5.  Stable abdominal aortic aneurysm. 5.  No small bowel obstruction. 6.  Stable bilateral nodular lesions adrenal glands. 7.  Mild distention of the left renal collecting system without frank hydronephrosis.  Stable cyst in the lower pole of the left kidney.   Original Report Authenticated By: Natasha Mead, M.D.   1. Anemia     MDM  Patient with symptomatic anemia. Stool neg for bleeding. Low BP for patient (low 100s, high 90s) but no signs of shock. Hgb at 7. Based on his intermittent lower abd pain, a CT will be obtained, but exam is not c/w a surgical abdomen. Will admit to hospitalist.  Audree Camel, MD 12/05/12 [phone]

## 2012-12-06 DIAGNOSIS — C659 Malignant neoplasm of unspecified renal pelvis: Secondary | ICD-10-CM

## 2012-12-06 DIAGNOSIS — C779 Secondary and unspecified malignant neoplasm of lymph node, unspecified: Secondary | ICD-10-CM

## 2012-12-06 DIAGNOSIS — I714 Abdominal aortic aneurysm, without rupture: Secondary | ICD-10-CM

## 2012-12-06 DIAGNOSIS — C787 Secondary malignant neoplasm of liver and intrahepatic bile duct: Secondary | ICD-10-CM

## 2012-12-06 LAB — BASIC METABOLIC PANEL
BUN: 36 mg/dL — ABNORMAL HIGH (ref 6–23)
Calcium: 8.9 mg/dL (ref 8.4–10.5)
GFR calc Af Amer: 56 mL/min — ABNORMAL LOW (ref >90)
GFR calc non Af Amer: 48 mL/min — ABNORMAL LOW (ref >90)
Glucose, Bld: 93 mg/dL (ref 70–99)
Sodium: 130 mEq/L — ABNORMAL LOW (ref 135–145)

## 2012-12-06 LAB — CBC
HCT: 26.6 % — ABNORMAL LOW (ref 39.0–52.0)
Hemoglobin: 8.4 g/dL — ABNORMAL LOW (ref 13.0–17.0)
MCH: 26.2 pg (ref 26.0–34.0)
MCHC: 31.6 g/dL (ref 30.0–36.0)
RDW: 16.3 % — ABNORMAL HIGH (ref 11.5–15.5)

## 2012-12-06 LAB — VITAMIN B12: Vitamin B-12: 566 pg/mL (ref 211–911)

## 2012-12-06 LAB — FERRITIN: Ferritin: 2134 ng/mL — ABNORMAL HIGH (ref 22–322)

## 2012-12-06 MED ORDER — OXYCODONE HCL 5 MG PO TABS: 5.0000 mg | ORAL_TABLET | ORAL | Status: DC | PRN

## 2012-12-06 MED ORDER — ENSURE COMPLETE PO LIQD: 237.0000 mL | Freq: Two times a day (BID) | ORAL | Status: DC

## 2012-12-06 MED ORDER — LORAZEPAM 0.5 MG PO TABS: 0.5000 mg | ORAL_TABLET | Freq: Three times a day (TID) | ORAL | Status: DC | PRN

## 2012-12-06 MED ORDER — OXYCODONE HCL ER 30 MG PO T12A: 30.0000 mg | EXTENDED_RELEASE_TABLET | Freq: Two times a day (BID) | ORAL | Status: DC

## 2012-12-06 NOTE — Progress Notes (Signed)
INITIAL NUTRITION ASSESSMENT  DOCUMENTATION CODES Per approved criteria  -Not Applicable   INTERVENTION: - Ensure Complete po BID, each supplement provides 350 kcal and 13 grams of protein.  NUTRITION DIAGNOSIS: Inadequate oral intake related to stage IV renal cancer as evidenced by reported weight loss.   Goal: Pt to meet >/= 90% of their estimated nutrition needs   Monitor:  Weight, po intake, labs  Reason for Assessment: MST  72 y.o. 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 Per MD note, pt with poor prognosis and focused on comfort care.   Pt reports minor weight loss. He says that he does like ensure and would like to try it.  Height: Ht Readings from Last 1 Encounters:  12/05/12 6\' 1"  (1.854 m)    Weight: Wt Readings from Last 1 Encounters:  09/16/12 200 lb (90.719 kg)    Ideal Body Weight: 79.9 kg  % Ideal Body Weight: 89%  Wt Readings from Last 10 Encounters:  09/16/12 200 lb (90.719 kg)  09/16/12 200 lb (90.719 kg)  07/20/12 208 lb 1.6 oz (94.394 kg)  06/20/12 202 lb (91.627 kg)  05/22/12 195 lb 11.2 oz (88.769 kg)  04/16/12 198 lb 6.4 oz (89.994 kg)  04/16/12 198 lb 6.4 oz (89.994 kg)  04/10/12 196 lb 4.8 oz (89.041 kg)  03/15/12 195 lb 3.2 oz (88.542 kg)  02/24/12 191 lb 9.6 oz (86.909 kg)    Usual Body Weight: unknown  % Usual Body Weight: unknown  BMI:  There is no weight on file to calculate BMI.  Estimated Nutritional Needs: Kcal: 2300-2400  Protein: 110-120 g Fluid: 2.3-2.4 L  Skin: stage 1 pressure ulcer on buttocks  Diet Order: General  EDUCATION NEEDS: -No education needs identified at this time   Intake/Output Summary (Last 24 hours) at 12/06/12 1321 Last data filed at 12/06/12 1315  Gross per 24 hour  Intake 1432.5 ml  Output    500 ml  Net  932.5 ml    Last BM: none recorded    Labs:   Recent Labs Lab 12/05/12 1325 12/06/12 0500  NA 130* 130*  K 4.5 4.4  CL 95* 98  CO2 24 24  BUN 39* 36*  CREATININE 1.52* 1.40*  CALCIUM 9.2 8.9  GLUCOSE 100* 93    CBG (last 3)  No results found for this basename: GLUCAP,  in the last 72 hours  Scheduled Meds: . amLODipine  5 mg Oral q morning - 10a  . atorvastatin  20 mg Oral QPM  . docusate sodium  100 mg Oral QPM  . enoxaparin (LOVENOX) injection  40 mg Subcutaneous Q24H  . escitalopram  20 mg Oral q morning - 10a  . lactulose  20 g Oral BID  . lipase/protease/amylase  2 capsule Oral TID AC  . OxyCODONE  30 mg Oral Q12H  . senna-docusate  2 tablet Oral BID    Continuous Infusions:   Past Medical History  Diagnosis Date  . Hx of colonic polyps   . Diverticulosis   . Hyperlipidemia   . AAA (abdominal aortic aneurysm) last ct 03-02-11    4.6cm  per note from dr fields w/ chart  . Substance abuse hx alcohol abuse  . Alcohol-induced chronic pancreatitis   . Hypertension stress test 1996- states wnl  . Peripheral vascular disease     occlusion left external iliac artery   .  Gross hematuria     freq/ urge/ nocturia  . Arthritis     knees  . Peripheral arterial disease   . Transitional cell carcinoma     kidney, ureter, bladder stage 4    Past Surgical History  Procedure Laterality Date  . Knee surgery  1961    left  . Wrist fracture surgery  2010    orif left wrist  . Cataract extraction w/ intraocular lens  implant, bilateral  1998  . Hernia repair  2010    Left inguinal herniorraphy with mesh  . Cystoscopy w/ retrogrades  03/14/2011    Procedure: CYSTOSCOPY WITH RETROGRADE PYELOGRAM;  Surgeon: Valetta Fuller, MD;  Location: [facility];  Service: Urology;  Laterality: Bilateral;  . Cystoscopy with biopsy  03/14/2011    Procedure: CYSTOSCOPY WITH BIOPSY;  Surgeon: Valetta Fuller, MD;  Location: [facility];  Service: Urology;  Laterality: N/A;  c arm   .  Cystoscopy w/ retrogrades  11/07/2011    Procedure: CYSTOSCOPY WITH RETROGRADE PYELOGRAM;  Surgeon: Valetta Fuller, MD;  Location: [facility];  Service: Urology;  Laterality: Bilateral;  CYSTOSCOPY, (B) RETROGRADE PYELOGRAM, RIGHT FLEXIBLE URETEROSCOPY, RIGHT JJ STENT    . Cystoscopy w/ ureteral stent placement  11/07/2011    Procedure: CYSTOSCOPY WITH STENT REPLACEMENT;  Surgeon: Valetta Fuller, MD;  Location: [facility];  Service: Urology;  Laterality: Right;  . Cholecystectomy   July 1997- laparoscopic    complications of infection, w/ additional surg's 4 minor and 3 major  . Robot assited laparoscopic nephroureterectomy  04/16/2012    Procedure: ROBOT ASSITED LAPAROSCOPIC NEPHROURETERECTOMY;  Surgeon: Crecencio Mc, MD;  Location: [facility];  Service: Urology;  Laterality: Right;  . Lymphadenectomy  04/16/2012    Procedure: LYMPHADENECTOMY;  Surgeon: Crecencio Mc, MD;  Location: [facility];  Service: Urology;  Laterality: Right;  RETROPERITONEAL LYMPHADENECTOMY  . Hip pinning,cannulated Right 09/16/2012    Procedure: CANNULATED HIP PINNING;  Surgeon: Cammy Copa, MD;  Location: [facility];  Service: Orthopedics;  Laterality: Right;    Ebbie Latus RD, LDN

## 2012-12-06 NOTE — Discharge Summary (Signed)
Physician Discharge Summary  [name]:[id number] DOB: [birth year] DOA: 12/05/2012  PCP: Zachary Petit, MD  Admit date: 12/05/2012 Discharge date: 12/06/2012  Recommendations for Outpatient Follow-up:  1. Pt will need to follow up with PCP in 1 week post discharge 2. Please obtain BMP to evaluate electrolytes and kidney function 3. Pt made aware he is on Lisinopril and that his kidney function has be to followed closely  4. Please also check CBC to evaluate Hg and Hct levels in 1 week or sooner 5. Please note that pt was transfused total of 3 units of PRBC while in the hospital   Discharge Diagnoses: Acute blood loss anemia Active Problems:   Bladder cancer   Closed right hip fracture   Chronic kidney disease, stage II (mild)   Anemia in neoplastic disease  Discharge Condition: Stable  Diet recommendation: Heart healthy diet discussed in details   History of present illness:  72 y.o. 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 His exwife who is his HCPoA provided most of the history, she reports progressive decline over the last 1 and 1/2 month in terms of energy, appetite, activity etc. He ambulates using a wheel chair now. His blood counts have been trending down for past month and on evaluation in SNF Hg was 6.9 and he was sent to the ER.  Pt denies any hemetemesis or melena, he reports occasional dark stools when he takes Iron. No h/o hematuria. Upon evaluation in ER noted to have Hb of 7.1, hemoccult negative, CT abd ordered and showed signs of progressive malignancy.  Hospital Course:  Active Problems:   Acute blood loss anemia - most likely malignancy related - pt has received total of 3 units of PRBC while inpatient - will need repeat CBC at SNF in one week or sooner based on clinical status    Bladder cancer -  appreciate Dr. Alver Fisher help - disease progression noted on CT scan below - will follow up on oncology recommendations    Closed right hip fracture - plan is to go back to SNF for continuation of rehabilitation    Chronic kidney disease, stage II (mild) - with acute component of renal failure secondary to blood loss - creatinine is trending down, will need to have it repeated in an outpatient setting in 2-3 weeks   Anemia in neoplastic disease  Procedures/Studies: Ct Abdomen Pelvis Wo Contrast  12/05/2012   1.  There is progression of the retroperitoneal and mesenteric nodular metastatic disease.  2.  Slight increase in mixed collection in the right nephrectomy bed.  3.  Significant progression of multiple low density lesions within the liver consistent with progression of metastatic disease.  4.  Stable low density lesion within the right psoas muscle.  5.  Stable abdominal aortic aneurysm.  5.  No small bowel obstruction.  6.  Stable bilateral nodular lesions adrenal glands.  7.  Mild distention of the left renal collecting system without frank hydronephrosis.  Stable cyst in the lower pole of the left kidney.    Consultations:  Oncology      Antibiotics:  None  Discharge Exam: Filed Vitals:   12/06/12 0600  BP: 130/66  Pulse: 53  Temp: 97.6 F (36.4 C)  Resp: 12   Filed Vitals:   12/06/12 0000 12/06/12 0100 12/06/12 0200 12/06/12 0600  BP: 101/63 86/52  108/62 130/66  Pulse: 60 66 52 53  Temp: 98.3 F (36.8 C) 98.4 F (36.9 C) 98.4 F (36.9 C) 97.6 F (36.4 C)  TempSrc: Oral Oral Oral Oral  Resp: 14 12 12 12   Height:      SpO2: 95%   100%    General: Pt is alert, follows commands appropriately, not in acute distress Cardiovascular: Regular rhythm, bradycardic, S1/S2 +, no murmurs, no rubs, no gallops Respiratory: Clear to auscultation bilaterally, no wheezing, no crackles, no rhonchi Abdominal: Soft, non tender, non distended, bowel sounds +, no  guarding Extremities: no edema, no cyanosis, pulses palpable bilaterally DP and PT Neuro: Grossly nonfocal  Discharge Instructions  Discharge Orders   Future Appointments Provider Department Dept Phone   12/07/2012 8:00 AM Sherrie Mustache [facility] [phone]   12/07/2012 9:00 AM [facility] [phone]   12/18/2012 2:30 PM Windell Hummingbird [facility] [phone]   12/18/2012 3:00 PM Benjiman Core, MD [facility] MEDICAL ONCOLOGY [phone]   02/01/2013 8:00 AM Lindley Magnus, MD [facility] [phone]   08/15/2013 8:30 AM [facility] [phone]   Eat a light meal the night before the exam but please avoid gaseous foods.   Nothing to eat or drink for at least 8 hours prior to the exam. No gum chewing or smoking the morning of the exam. Please take your morning medications with small sips of water, especially blood pressure medication. If you have several vascular lab exams and will see physician, please bring a snack with you.   08/15/2013 9:00 AM Sherren Kerns, MD [facility] [phone]   Future Orders Complete By Expires     Diet - low sodium heart healthy  As directed     Increase activity slowly  As directed         Medication List         amLODipine 5 MG tablet  Commonly known as:  NORVASC  Take 5 mg by mouth every morning.     aspirin 81 MG tablet  Take 81 mg by mouth every morning.     atorvastatin 20 MG tablet  Commonly known as:  LIPITOR  Take 20 mg by mouth every evening.     bisacodyl 5 MG EC tablet  Commonly known as:  DULCOLAX  Take 10 mg by mouth daily as needed for constipation.     bisacodyl 10 MG suppository  Commonly known as:  DULCOLAX  Place 10 mg rectally daily as needed for constipation.     docusate sodium 100 MG  capsule  Commonly known as:  COLACE  Take 100 mg by mouth every evening.     enoxaparin 40 MG/0.4ML injection  Commonly known as:  LOVENOX  Inject 40 mg into the skin every morning.     escitalopram 20 MG tablet  Commonly known as:  LEXAPRO  Take 20 mg by mouth every morning.     ferrous sulfate 325 (65 FE) MG tablet  Take 325 mg by mouth at bedtime.     Lactulose 20 GM/30ML Soln  Take 30 mLs by mouth 2 (two) times daily.     lipase/protease/amylase 38756 UNITS Cpep capsule  Commonly known as:  CREON  Take 2 capsules by mouth 3 (three) times daily before meals.     lisinopril 20 MG tablet  Commonly known as:  PRINIVIL,ZESTRIL  Take 20 mg by mouth every evening.     LORazepam 0.5 MG tablet  Commonly known as:  ATIVAN  Take 1 tablet (0.5 mg total) by mouth 3 (three) times daily as needed for anxiety.     oxyCODONE 5 MG immediate release tablet  Commonly known as:  Oxy IR/ROXICODONE  Take 1-2 tablets (5-10 mg total) by mouth every 4 (four) hours as needed (He takes one tablet for mild to moderate pain and two tablets for severe pain.).     OxyCODONE HCl ER 30 MG T12a  Commonly known as:  OXYCONTIN  Take 30 mg by mouth every 12 (twelve) hours.     prochlorperazine 10 MG tablet  Commonly known as:  COMPAZINE  Take 10 mg by mouth every 6 (six) hours as needed (for nausea).     sennosides-docusate sodium 8.6-50 MG tablet  Commonly known as:  SENOKOT-S  Take 2 tablets by mouth 2 (two) times daily.           Follow-up Information   Follow up with Zachary Petit, MD In 2 weeks.   Contact information:   [address] [place] [address] [phone]       Follow up with Zachary Presto, MD. (As needed if symptoms worsen)    Contact information:   [address] [place] [address] [phone]       The results of significant diagnostics from this hospitalization (including imaging, microbiology, ancillary and laboratory)  are listed below for reference.     Microbiology: Recent Results (from the past 240 hour(s))  MRSA PCR SCREENING     Status: None   Collection Time    12/05/12  5:06 PM      Result Value Range Status   MRSA by PCR NEGATIVE  NEGATIVE Final   Comment:            The GeneXpert MRSA Assay (FDA     approved for NASAL specimens     only), is one component of a     comprehensive MRSA colonization     surveillance program. It is not     intended to diagnose MRSA     infection nor to guide or     monitor treatment for     MRSA infections.     Labs: Basic Metabolic Panel:  Recent Labs Lab 12/05/12 1325 12/06/12 0500  NA 130* 130*  K 4.5 4.4  CL 95* 98  CO2 24 24  GLUCOSE 100* 93  BUN 39* 36*  CREATININE 1.52* 1.40*  CALCIUM 9.2 8.9   Liver Function Tests:  Recent Labs Lab 12/05/12 1325  AST 26  ALT 13  ALKPHOS 142*  BILITOT 0.4  PROT 7.2  ALBUMIN 2.5*   CBC:  Recent Labs Lab 12/05/12 1325 12/06/12 0500  WBC 11.6* 10.4  NEUTROABS 10.5*  --   HGB 7.1* 8.4*  HCT 23.5* 26.6*  MCV 82.5 82.9  PLT 405* 347   SIGNED: Time coordinating discharge: Over 30 minutes  Zachary Presto, MD  [facility] 12/06/2012, 10:19 AM Pager [phone]  If 7PM-7AM, please contact night-coverage [email or web address] Password [email or web address]

## 2012-12-06 NOTE — Clinical Social Work Psychosocial (Signed)
     Clinical Social Work Department BRIEF PSYCHOSOCIAL ASSESSMENT 12/06/2012  Patient:  [name], [name]     Account Number:  [id number]     Admit date:  12/05/2012  Clinical Social Worker:  Hattie Perch  Date/Time:  12/06/2012 12:00 M  Referred by:  Physician  Date Referred:  12/06/2012 Referred for  SNF Placement   Other Referral:   Interview type:  Patient Other interview type:   ex wife at bedside    PSYCHOSOCIAL DATA Living Status:  FACILITY Admitted from facility:  [facility] Level of care:  Skilled Nursing Facility Primary support name:  Ediberto Sens Primary support relationship to patient:  NONE Degree of support available:   good    CURRENT CONCERNS Current Concerns  Post-Acute Placement   Other Concerns:    SOCIAL WORK ASSESSMENT / PLAN CSW met with patient and ex wife who is POA at bedside. patient was at [facility] for rehab when he came in last night and is ready to return today. patient happily wants to go back to [facility].   Assessment/plan status:   Other assessment/ plan:   Information/referral to community resources:    PATIENTS/FAMILYS RESPONSE TO PLAN OF CARE: patient is fine to discharge back to [facility]. ex wife wants to transport and was very irritated that CSW couldnt get packet as quickly as she wanted to have it due to computer systems being down.

## 2012-12-06 NOTE — Care Management Note (Signed)
    Page 1 of 1   12/06/2012     11:39:16 AM   CARE MANAGEMENT NOTE 12/06/2012  Patient:  [name], [name]   Account Number:  [id number]  Date Initiated:  12/06/2012  Documentation initiated by:  Lanier Clam  Subjective/Objective Assessment:   ADMITTED W/ANEMIA.     Action/Plan:   FROM SNF-[facility]   Anticipated DC Date:  12/06/2012   Anticipated DC Plan:  SKILLED NURSING FACILITY      DC Planning Services  CM consult      Choice offered to / List presented to:             Status of service:  Completed, signed off Medicare Important Message given?   (If response is "NO", the following Medicare IM given date fields will be blank) Date Medicare IM given:   Date Additional Medicare IM given:    Discharge Disposition:  SKILLED NURSING FACILITY  Per UR Regulation:  Reviewed for med. necessity/level of care/duration of stay  If discussed at Long Length of Stay Meetings, dates discussed:    Comments:  12/06/12 Zachary Grabe RN,BSN NCM [phone] D/C SNF.

## 2012-12-06 NOTE — Progress Notes (Signed)
Patient seen and examined laboratory data as well as CT scan was reviewed today. 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 Patient admitted with mild anemia and deterioration. I had a lengthy discussion today with [name] including his overall prognosis as well as treatment options.  At this point, I feel that he is really not a candidate for systemic chemotherapy and given his advanced disease and his deterioration in his health I recommended proceeding with hospice and supportive care only. I agree with the packed red cell transfusion like you're doing and we will arrange for hospice referral as an outpatient to visit with him while he is at a skilled nursing facility. I've also talked about possible transitioning him to home with home hospice care versus possibly residential hospice such as [facility]. He understand that his prognosis is poor and has limited life expectancy probably in 6 months or less.

## 2012-12-07 ENCOUNTER — Other Ambulatory Visit: Payer: Medicare Other | Admitting: Lab

## 2012-12-07 ENCOUNTER — Other Ambulatory Visit: Payer: Self-pay | Admitting: Geriatric Medicine

## 2012-12-07 ENCOUNTER — Encounter: Payer: Self-pay | Admitting: Adult Health

## 2012-12-07 ENCOUNTER — Encounter (HOSPITAL_COMMUNITY)
Admission: RE | Admit: 2012-12-07 | Discharge: 2012-12-07 | Disposition: A | Discharge status: Home / Self Care | Payer: Medicare Other | Source: Ambulatory Visit | Attending: Oncology | Admitting: Oncology

## 2012-12-07 LAB — TYPE AND SCREEN
ABO/RH(D): A POS
Antibody Screen: NEGATIVE
Unit division: 0

## 2012-12-07 MED ORDER — LORAZEPAM 0.5 MG PO TABS: 0.5000 mg | ORAL_TABLET | Freq: Three times a day (TID) | ORAL | Status: AC | PRN

## 2012-12-07 MED ORDER — OXYCODONE HCL ER 30 MG PO T12A: 30.0000 mg | EXTENDED_RELEASE_TABLET | Freq: Two times a day (BID) | ORAL | Status: AC

## 2012-12-07 MED ORDER — OXYCODONE HCL 5 MG PO TABS: 5.0000 mg | ORAL_TABLET | ORAL | Status: AC | PRN

## 2012-12-10 ENCOUNTER — Non-Acute Institutional Stay (SKILLED_NURSING_FACILITY): Payer: Medicare Other | Admitting: Internal Medicine

## 2012-12-10 DIAGNOSIS — N182 Chronic kidney disease, stage 2 (mild): Secondary | ICD-10-CM

## 2012-12-10 DIAGNOSIS — D62 Acute posthemorrhagic anemia: Secondary | ICD-10-CM

## 2012-12-10 DIAGNOSIS — C679 Malignant neoplasm of bladder, unspecified: Secondary | ICD-10-CM

## 2012-12-10 DIAGNOSIS — S72001S Fracture of unspecified part of neck of right femur, sequela: Secondary | ICD-10-CM

## 2012-12-10 DIAGNOSIS — S72009S Fracture of unspecified part of neck of unspecified femur, sequela: Secondary | ICD-10-CM

## 2012-12-13 ENCOUNTER — Telehealth: Payer: Self-pay | Admitting: Oncology

## 2012-12-13 ENCOUNTER — Encounter: Payer: Self-pay | Admitting: Adult Health

## 2012-12-13 NOTE — Telephone Encounter (Signed)
Jessica from [facility] call to cx and will call back to r/s

## 2012-12-18 ENCOUNTER — Other Ambulatory Visit: Payer: Medicare Other | Admitting: Lab

## 2012-12-18 ENCOUNTER — Ambulatory Visit: Payer: Medicare Other | Admitting: Oncology

## 2012-12-21 ENCOUNTER — Telehealth: Payer: Self-pay | Admitting: Dietician

## 2012-12-30 NOTE — Progress Notes (Signed)
Patient ID: [name], male   DOB: 15-Jan-1941, 72 y.o.   MRN: [id number]       PROGRESS NOTE  DATE: 11/12/2012  FACILITY:  [facility]  LEVEL OF CARE:   SNF (31)  Acute Visit  CHIEF COMPLAINT:  Manage Anemia and Depression  HISTORY OF PRESENT ILLNESS: This is a 72 year old male who was noted to have hgb 7.5 - low. Patient complains of feeling tired. He had a recent right hip fracture S/P percutaneous pinning and wrist fracture. He has chronic constipation and concerned that he will have severe constipationPatient verbalized feeling depressed. He requested for medication to help him with his mood.  PAST MEDICAL HISTORY : Reviewed.  No changes.  CURRENT MEDICATIONS: Reviewed per [facility]  REVIEW OF SYSTEMS:  GENERAL: no change in appetite, no fatigue, no weight changes, no fever, chills or weakness RESPIRATORY: no cough, SOB, DOE,, wheezing, hemoptysis CARDIAC: no chest pain, edema or palpitations GI: no abdominal pain, diarrhea, constipation, heart burn, nausea or vomiting  PHYSICAL EXAMINATION  VS:  T 96.3       P 70      RR16       BP135/68            WT189.6 (Lb)  GENERAL: no acute distress, normal body habitus EYES: conjunctivae normal, sclerae normal, normal eye lids NECK: supple, trachea midline, no neck masses, no thyroid tenderness, no thyromegaly RESPIRATORY: breathing is even & unlabored, BS CTAB CARDIAC: RRR, no murmur,no extra heart sounds, no edema GI: abdomen soft, normal BS, no masses, no tenderness, no hepatomegaly, no splenomegaly PSYCHIATRIC: the patient is alert & oriented to person, sad affect & behavior appropriate  LABS/RADIOLOGY: 11/10/12 WBC 7.5 hemoglobin 7.5 hematocrit 33.0 sodium 136 potassium 4.7 glucose 97 BUN 15 creatinine 1.28 calcium 9.0 PSA 0.40  10/22/12 the patient profile the arm all sodium 134 potassium 4.5 glucose 145 BUN 20 creatinine 1.32 calcium 9.4 10/12/12 WBC 9.6 hemoglobin 9.5 hematocrit 28.7 sodium 137 potassium 4.6  glucose 95  BUN 18 creatinine 1.15 calcium 9.2  ASSESSMENT/PLAN:  Anemia - start FeSO4 325 mg 1 tab by mouth each bedtime; MOM 30 mL plus prune juice by mouth at 4 PM  Depression - Lexapro 10 mg 1 PO Q D  CPT CODE: 62952

## 2012-12-31 DEATH — deceased

## 2013-01-02 DIAGNOSIS — D62 Acute posthemorrhagic anemia: Secondary | ICD-10-CM | POA: Insufficient documentation

## 2013-01-02 NOTE — Progress Notes (Addendum)
Patient ID: [name], male   DOB: [birth year], 72 y.o.   MRN: [id number]        HISTORY & PHYSICAL  DATE: 12/10/2012   FACILITY: [facility]  LEVEL OF CARE: SNF (31)  ALLERGIES:  No Known Allergies  CHIEF COMPLAINT:  Manage acute blood loss anemia, bladder cancer, and chronic kidney disease stage II.    HISTORY OF PRESENT ILLNESS:  The patient is a 72 year-old, Caucasian male.    ANEMIA: The patient was hospitalized secondary to progressive decline and a hemoglobin of 6.9.  He was transfused 3 U of packed red blood cells.  The anemia has been stable. The patient denies fatigue, melena or hematochezia. No complications from the medications currently being used.  Patient overall is a poor historian.    BLADDER CANCER:  The patient has a history of bladder cancer.  CT of the abdomen and pelvis showed progression of malignancy.    CHRONIC KIDNEY DISEASE: The patient's chronic kidney disease remains stable.  Patient denies increasing lower extremity swelling or confusion. Last BUN and creatinine are:   BUN 36, creatinine 1.4.  PAST MEDICAL HISTORY :  Past Medical History  Diagnosis Date  . Hx of colonic polyps   . Diverticulosis   . Hyperlipidemia   . AAA (abdominal aortic aneurysm) last ct 03-02-11    4.6cm  per note from dr fields w/ chart  . Substance abuse hx alcohol abuse  . Alcohol-induced chronic pancreatitis   . Hypertension stress test 1996- states wnl  . Peripheral vascular disease     occlusion left external iliac artery   . Gross hematuria     freq/ urge/ nocturia  . Arthritis     knees  . Peripheral arterial disease   . Transitional cell carcinoma     kidney, ureter, bladder stage 4    PAST SURGICAL HISTORY: Past Surgical History  Procedure Laterality Date  . Knee surgery  1961    left  . Wrist fracture surgery  2010    orif left wrist  . Cataract extraction w/ intraocular lens  implant, bilateral  1998  . Hernia repair  2010   Left inguinal herniorraphy with mesh  . Cystoscopy w/ retrogrades  03/14/2011    Procedure: CYSTOSCOPY WITH RETROGRADE PYELOGRAM;  Surgeon: Valetta Fuller, MD;  Location: [facility];  Service: Urology;  Laterality: Bilateral;  . Cystoscopy with biopsy  03/14/2011    Procedure: CYSTOSCOPY WITH BIOPSY;  Surgeon: Valetta Fuller, MD;  Location: [facility];  Service: Urology;  Laterality: N/A;  c arm   . Cystoscopy w/ retrogrades  11/07/2011    Procedure: CYSTOSCOPY WITH RETROGRADE PYELOGRAM;  Surgeon: Valetta Fuller, MD;  Location: [facility];  Service: Urology;  Laterality: Bilateral;  CYSTOSCOPY, (B) RETROGRADE PYELOGRAM, RIGHT FLEXIBLE URETEROSCOPY, RIGHT JJ STENT    . Cystoscopy w/ ureteral stent placement  11/07/2011    Procedure: CYSTOSCOPY WITH STENT REPLACEMENT;  Surgeon: Valetta Fuller, MD;  Location: [facility];  Service: Urology;  Laterality: Right;  . Cholecystectomy   July 1997- laparoscopic    complications of infection, w/ additional surg's 4 minor and 3 major  . Robot assited laparoscopic nephroureterectomy  04/16/2012    Procedure: ROBOT ASSITED LAPAROSCOPIC NEPHROURETERECTOMY;  Surgeon: Crecencio Mc, MD;  Location: [facility];  Service: Urology;  Laterality: Right;  . Lymphadenectomy  04/16/2012    Procedure: LYMPHADENECTOMY;  Surgeon: Crecencio Mc, MD;  Location:  [facility];  Service: Urology;  Laterality: Right;  RETROPERITONEAL LYMPHADENECTOMY  . Hip pinning,cannulated Right 09/16/2012    Procedure: CANNULATED HIP PINNING;  Surgeon: Cammy Copa, MD;  Location: [facility];  Service: Orthopedics;  Laterality: Right;    SOCIAL HISTORY:  reports that he quit smoking about 9 years ago. His smoking use included Cigarettes. He has a 100 pack-year smoking history. He has never used smokeless tobacco. He reports that he does not drink alcohol or use illicit drugs.  FAMILY HISTORY:  Family History  Problem Relation Age of Onset  .  Heart disease Mother 43  . Heart attack Father 6  . Heart disease Father   . Hypertension Brother     CURRENT MEDICATIONS: Reviewed per [facility]  REVIEW OF SYSTEMS:  Difficult to obtain.  Patient is a poor historian.    PHYSICAL EXAMINATION  VS:  T 98.6       P 62      RR 18      BP 102/65      POX 94% room air        WT (Lb)  GENERAL: no acute distress, normal body habitus EYES: conjunctivae normal, sclerae normal, normal eye lids MOUTH/THROAT: lips without lesions,no lesions in the mouth,tongue is without lesions,uvula elevates in midline NECK: supple, trachea midline, no neck masses, no thyroid tenderness, no thyromegaly LYMPHATICS: no LAN in the neck, no supraclavicular LAN RESPIRATORY: breathing is even & unlabored, BS CTAB CARDIAC: RRR, no murmur,no extra heart sounds EDEMA/VARICOSITIES:  +1 bilateral lower extremity edema    GI:  ABDOMEN: abdomen soft, normal BS, no masses, no tenderness  LIVER/SPLEEN: no hepatomegaly, no splenomegaly MUSCULOSKELETAL: HEAD: normal to inspection & palpation BACK: no kyphosis, scoliosis or spinal processes tenderness EXTREMITIES: LEFT UPPER EXTREMITY: full range of motion, normal strength & tone RIGHT UPPER EXTREMITY:  full range of motion, normal strength & tone LEFT LOWER EXTREMITY: strength intact, range of motion minimal   RIGHT LOWER EXTREMITY: strength intact, range of motion not tested due to his hip surgery   PSYCHIATRIC: the patient is alert & oriented to person, affect & behavior appropriate  LABS/RADIOLOGY: MRSA by PCR negative.     Creatinine 1.4, BUN 36, otherwise BMP normal.    Alkaline phosphatase 142, albumin 2.5, otherwise liver profile normal.    Hemoglobin 8.4, MCV 82.9, otherwise CBC normal.    CT of the abdomen and pelvis showed progression of the retroperitoneal and mesenteric nodular disease.     ASSESSMENT/PLAN:  Acute blood loss anemia.  Status post transfusion.  Reassess hemoglobin level.    Bladder cancer.   Disease is progressive.    Chronic kidney disease stage II.  Reassess.   Closed right hip fracture.  Status post surgery.  Continue rehabilitation.    Hypertension.  Adequately controlled.    UTI.  New problem.  Bactrim was started.    Check CBC and BMP.     I have reviewed patient's medical records received at admission/from hospitalization.  CPT CODE: 09811

## 2013-02-01 ENCOUNTER — Ambulatory Visit: Payer: Medicare Other | Admitting: Internal Medicine

## 2013-04-19 NOTE — Progress Notes (Signed)
This encounter was created in error - please disregard.

## 2013-05-14 NOTE — Progress Notes (Signed)
This encounter was created in error - please disregard.

## 2013-06-21 NOTE — Progress Notes (Signed)
This encounter was created in error - please disregard.

## 2013-08-15 ENCOUNTER — Ambulatory Visit: Payer: Medicare Other | Admitting: Vascular Surgery

## 2013-12-24 ENCOUNTER — Other Ambulatory Visit: Payer: Self-pay | Admitting: Pharmacist

## 2014-01-18 IMAGING — CT CT ABD-PELV W/ CM
2 of 5 series · 15 of 46 positions shown, 17 images · IV contrast (OMNIPAQUE)
Comparison: 07/13/2012

CLINICAL DATA: Metastatic bladder cancer.  Right nephrectomy.
Recent fall with right hip and arm fractures.  Chronic
pancreatitis.  Abdominal aortic aneurysm.

CT ABDOMEN AND PELVIS WITH CONTRAST
TECHNIQUE: Multidetector CT imaging of the abdomen and pelvis was
performed following the standard protocol during bolus
administration of intravenous contrast.
Contrast: 100mL OMNIPAQUE IOHEXOL 300 MG/ML  SOLN

[Series 2: rtn a/p with · axial · 0.87mm/px · z∈[-534,-124]mm · 12 of 92 slices shown, 14 images]
[im 5/92  soft-tissue]
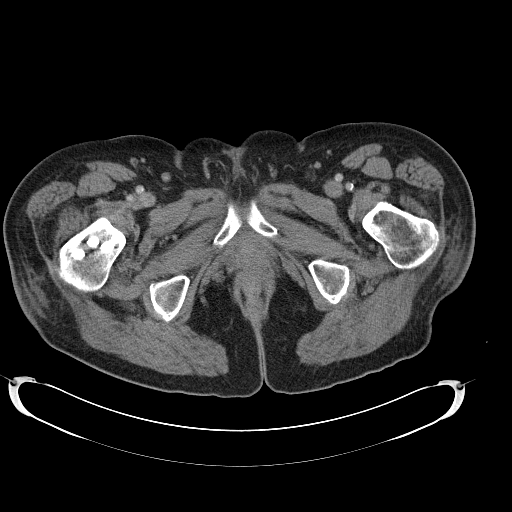
[im 5/92  bone]
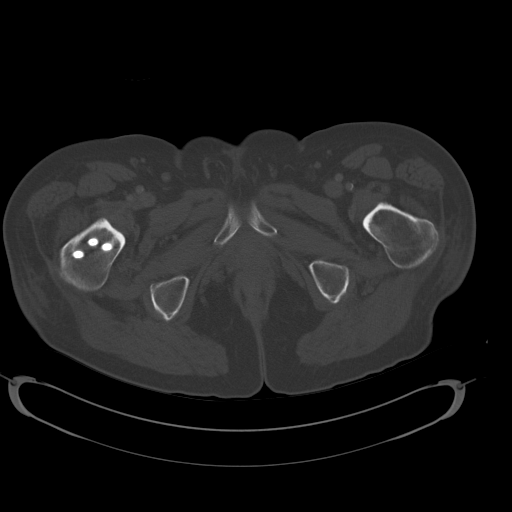
[im 15/92  soft-tissue]
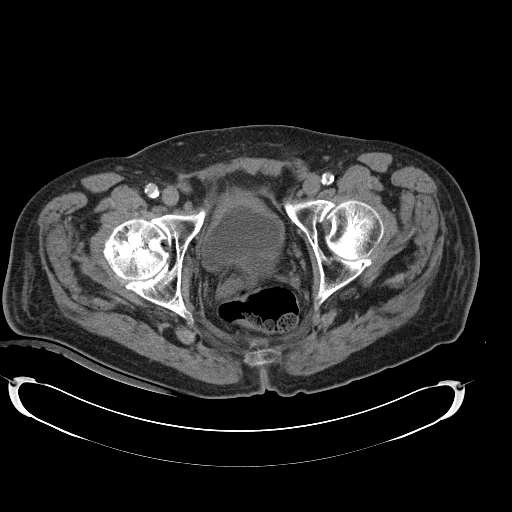
[im 20/92  soft-tissue]
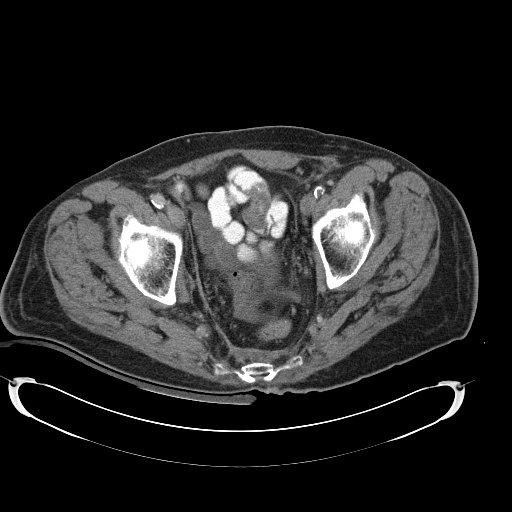
[im 29/92  soft-tissue]
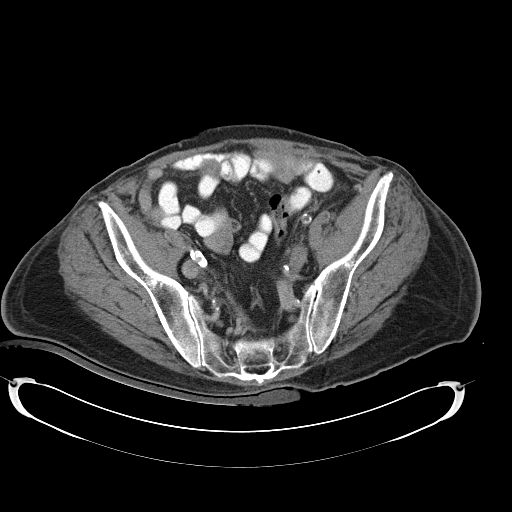
[im 34/92  soft-tissue]
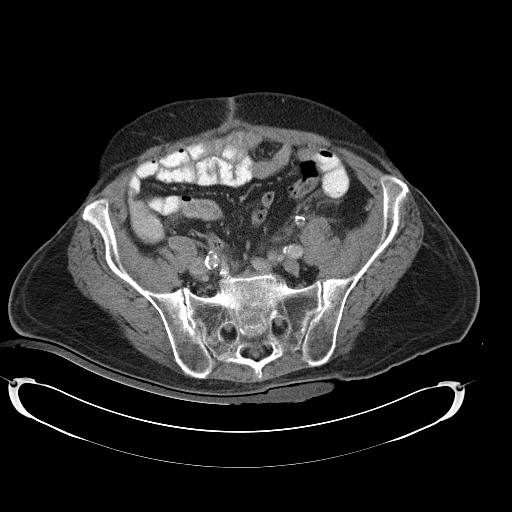
[im 44/92  soft-tissue]
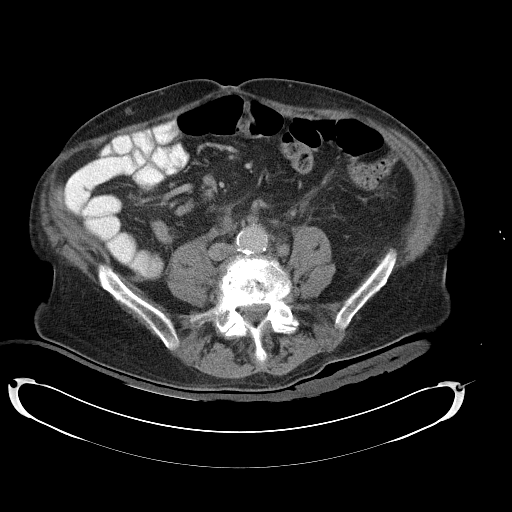
[im 48/92  soft-tissue]
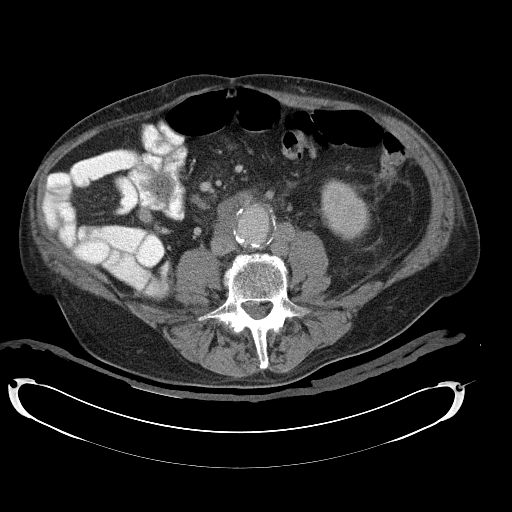
[im 58/92  soft-tissue]
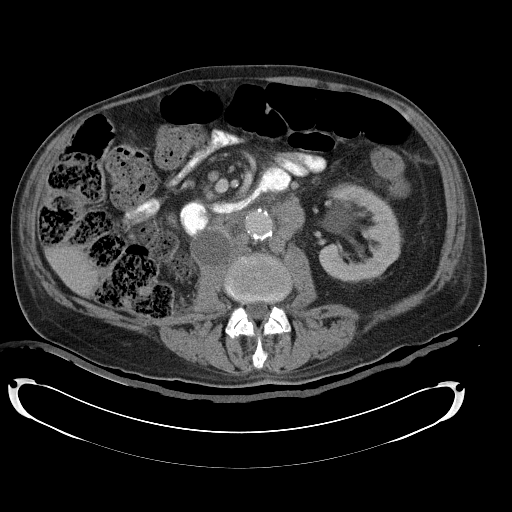
[im 63/92  soft-tissue]
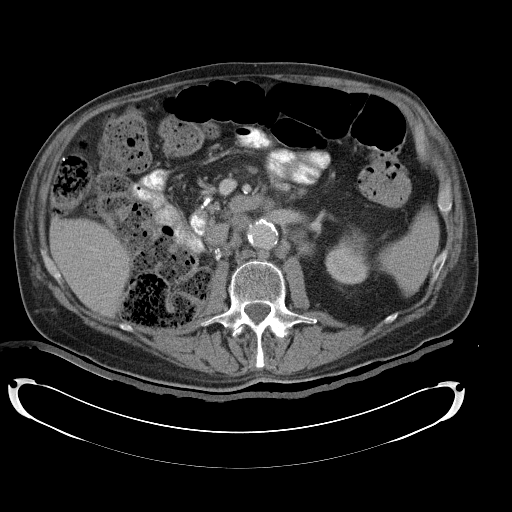
[im 63/92  bone]
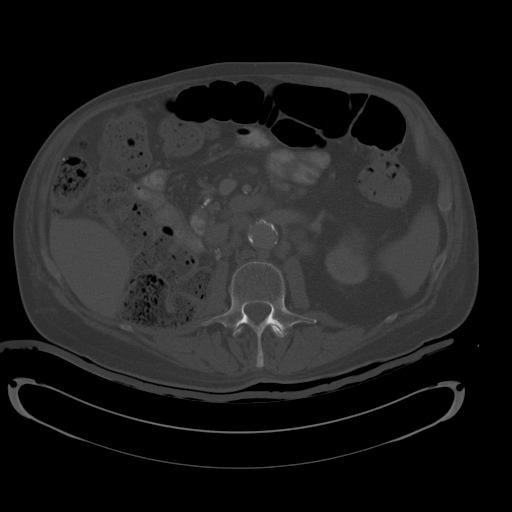
[im 72/92  soft-tissue]
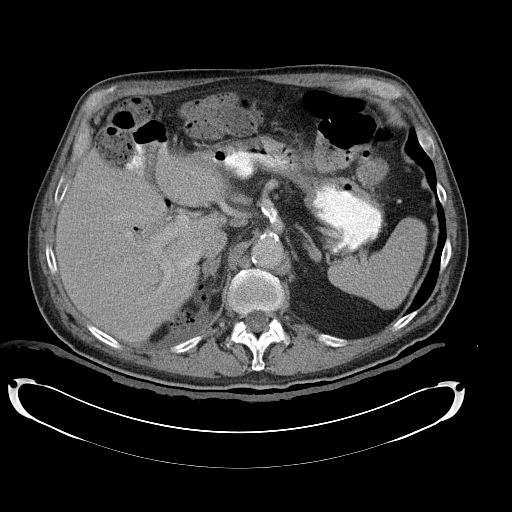
[im 77/92  soft-tissue]
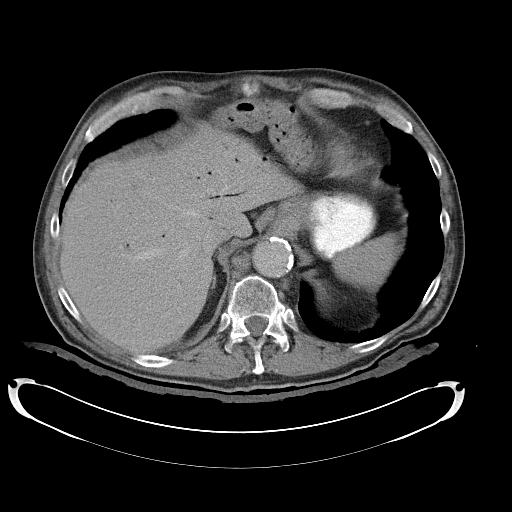
[im 87/92  soft-tissue]
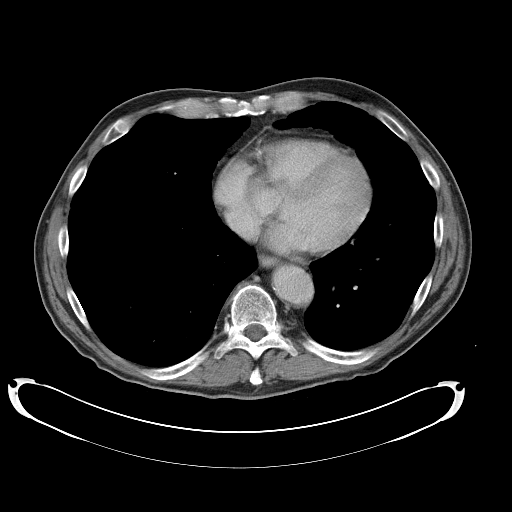

[Series 602: <mpr thick range> · coronal · 0.89mm/px · 3 of 89 slices shown]
[im 30/89  soft-tissue]
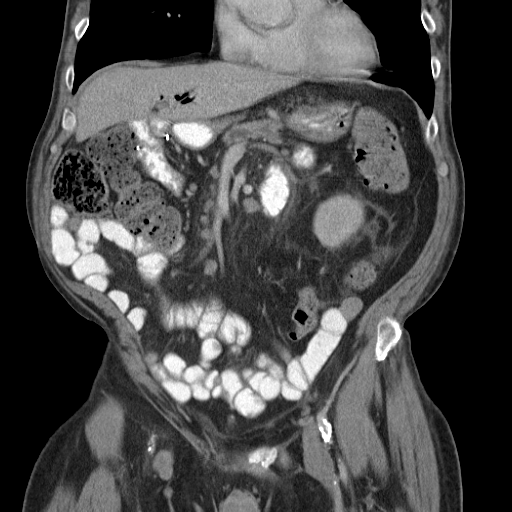
[im 40/89  soft-tissue]
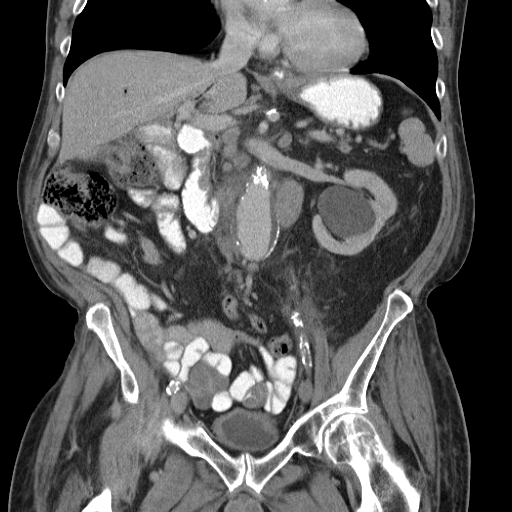
[im 49/89  soft-tissue]
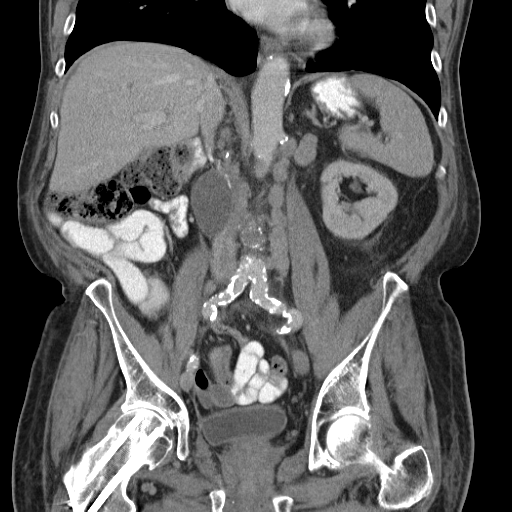

[15 of 46 positions shown; findings below may reference images not displayed]

FINDINGS: Lung bases:  Clear lung bases.  Mild cardiomegaly with
coronary artery atherosclerosis.  Ascending aortic aneurysm which
is incompletely imaged. No pericardial or pleural effusion.  Para
esophageal node measures 1.2 cm on image 12/series 2 versus 6 mm on
the prior.  Development of retrocrural adenopathy, with an index
node measuring 1.5 cm on image 15/series 2.

Abdomen/pelvis:  Possible too small to characterize right liver
lobe lesion on image 12/series 2.  Vague hypoattenuation in the
right lobe liver anteriorly on image 24/series 2 at 1.0 cm.  This
may have been present on the prior.  There is also subtle area of
hypoattenuation more posteriorly in the right lobe of the liver
which measures 1.1 cm.  Image 25/series 2.  Not readily identified
on the prior.
Possible too small to characterize hepatic dome lesion on image
12/series 2.

Redemonstration of pneumobilia within the left lobe.

Normal spleen, stomach.

Moderate pancreatic atrophy.  Similar mild ductal dilatation within
the head without definite obstructive stone or mass.
Cholecystectomy without biliary ductal dilatation.

Similar bilateral adrenal nodularity.  Stable appearance of the
left kidney, with too small to characterize lesions and a lower
pole sinus cyst.

Status post right nephrectomy.  Slight increase in fluid within the
nephrectomy bed on image 19/series 2.  No abnormal enhancement or
solid component.

The IVC is displaced by the below described retroperitoneal fluid
collection.  Appears patent both proximally and distally.

Progressive retroperitoneal adenopathy.  Index left periaortic
nodes measure up to 2.6 cm on image 36/series 2 versus 1.3 cm on
the prior.
A preaortic node measures 1.8 cm on image 32/series 2 and is new.

Low density lesion along the anterior aspect of the right psoas
muscle measures 3.2 x 3.3 cm on image 37/series 2 versus 3.5 x
cm on the prior exam.

Colonic stool burden suggests constipation.

Infrarenal abdominal aortic aneurysm of maximally 5.0 cm.  No
surrounding hemorrhage.No pelvic adenopathy.  Anterior bladder wall
thickening on image 77 could be partially due to underdistension.
Normal prostate, without significant free pelvic fluid.  Similar
nonspecific presacral fascial thickening.

Bones/Musculoskeletal:  Right proximal femoral fixation.  Moderate
osteopenia.
IMPRESSION: 1.  Moderate progression of nodal metastasis within the upper
abdomen and extension into the nodal stations of the lower chest.
2.  Subtle low density foci within the liver.  Some of these may be
new since the prior exam.  Cannot exclude early hepatic metastasis.
Clinical strategies could include follow-up CT versus further
characterization with pre and post contrast MRI.  PET may also be
informative.
3.  Similar infrarenal abdominal aortic aneurysm.
4.  Postoperative seroma versus  less likely necrotic nodal
metastasis within the retroperitoneum, slightly decreased in size.
5.  Status post right nephrectomy with slight increase in
nonspecific fluid in the nephrectomy bed.
6.  Nonspecific anterior bladder wall thickening.  This may be
accentuated by underdistension in this area.
7.  Similar bilateral adrenal nodularity.
8. Possible constipation.
